# Patient Record
Sex: Female | Born: 1975
Health system: Southern US, Community
[De-identification: ages and names within clinical notes are randomized; demographics above are authoritative.]

## PROBLEM LIST (undated history)

## (undated) DIAGNOSIS — E669 Obesity, unspecified: Secondary | ICD-10-CM

## (undated) DIAGNOSIS — R51 Headache: Secondary | ICD-10-CM

## (undated) DIAGNOSIS — H548 Legal blindness, as defined in USA: Secondary | ICD-10-CM

## (undated) HISTORY — PX: ABDOMINAL HYSTERECTOMY: SHX81

## (undated) HISTORY — PX: TUBAL LIGATION: SHX77

## (undated) HISTORY — PX: LEG SURGERY: SHX1003

## (undated) HISTORY — PX: EYE SURGERY: SHX253

---

## 2007-06-08 ENCOUNTER — Emergency Department (HOSPITAL_COMMUNITY): Admission: EM | Admit: 2007-06-08 | Discharge: 2007-06-08 | Payer: Self-pay | Admitting: Emergency Medicine

## 2010-06-23 ENCOUNTER — Emergency Department (HOSPITAL_COMMUNITY): Admission: EM | Admit: 2010-06-23 | Discharge: 2010-06-23 | Payer: Self-pay | Admitting: Emergency Medicine

## 2011-01-01 LAB — POCT I-STAT, CHEM 8
BUN: 10 mg/dL (ref 6–23)
Calcium, Ion: 0.96 mmol/L — ABNORMAL LOW (ref 1.12–1.32)
Chloride: 107 mEq/L (ref 96–112)
Creatinine, Ser: 0.7 mg/dL (ref 0.4–1.2)
Glucose, Bld: 82 mg/dL (ref 70–99)

## 2011-01-01 LAB — GLUCOSE, CAPILLARY: Glucose-Capillary: 113 mg/dL — ABNORMAL HIGH (ref 70–99)

## 2011-01-08 ENCOUNTER — Inpatient Hospital Stay (INDEPENDENT_AMBULATORY_CARE_PROVIDER_SITE_OTHER)
Admission: RE | Admit: 2011-01-08 | Discharge: 2011-01-08 | Disposition: A | Discharge status: Home / Self Care | Payer: Self-pay | Source: Ambulatory Visit | Attending: Family Medicine | Admitting: Family Medicine

## 2011-01-08 DIAGNOSIS — K649 Unspecified hemorrhoids: Secondary | ICD-10-CM

## 2011-03-25 ENCOUNTER — Emergency Department (HOSPITAL_COMMUNITY)
Admission: EM | Admit: 2011-03-25 | Discharge: 2011-03-25 | Disposition: A | Discharge status: Home / Self Care | Payer: Self-pay | Attending: Emergency Medicine | Admitting: Emergency Medicine

## 2011-03-25 DIAGNOSIS — K089 Disorder of teeth and supporting structures, unspecified: Secondary | ICD-10-CM | POA: Insufficient documentation

## 2011-03-25 DIAGNOSIS — K047 Periapical abscess without sinus: Secondary | ICD-10-CM | POA: Insufficient documentation

## 2011-03-25 DIAGNOSIS — K029 Dental caries, unspecified: Secondary | ICD-10-CM | POA: Insufficient documentation

## 2011-11-08 ENCOUNTER — Encounter (HOSPITAL_COMMUNITY): Payer: Self-pay | Admitting: Emergency Medicine

## 2011-11-08 ENCOUNTER — Emergency Department (HOSPITAL_COMMUNITY)
Admission: EM | Admit: 2011-11-08 | Discharge: 2011-11-09 | Disposition: A | Discharge status: Home / Self Care | Payer: Self-pay | Attending: Emergency Medicine | Admitting: Emergency Medicine

## 2011-11-08 ENCOUNTER — Other Ambulatory Visit: Payer: Self-pay

## 2011-11-08 DIAGNOSIS — N926 Irregular menstruation, unspecified: Secondary | ICD-10-CM | POA: Insufficient documentation

## 2011-11-08 DIAGNOSIS — J45909 Unspecified asthma, uncomplicated: Secondary | ICD-10-CM | POA: Insufficient documentation

## 2011-11-08 DIAGNOSIS — R55 Syncope and collapse: Secondary | ICD-10-CM | POA: Insufficient documentation

## 2011-11-08 DIAGNOSIS — R42 Dizziness and giddiness: Secondary | ICD-10-CM | POA: Insufficient documentation

## 2011-11-08 DIAGNOSIS — H548 Legal blindness, as defined in USA: Secondary | ICD-10-CM | POA: Insufficient documentation

## 2011-11-08 DIAGNOSIS — G43829 Menstrual migraine, not intractable, without status migrainosus: Secondary | ICD-10-CM | POA: Insufficient documentation

## 2011-11-08 DIAGNOSIS — R109 Unspecified abdominal pain: Secondary | ICD-10-CM | POA: Insufficient documentation

## 2011-11-08 DIAGNOSIS — N939 Abnormal uterine and vaginal bleeding, unspecified: Secondary | ICD-10-CM | POA: Insufficient documentation

## 2011-11-08 HISTORY — DX: Legal blindness, as defined in USA: H54.8

## 2011-11-08 LAB — POCT I-STAT, CHEM 8
BUN: 9 mg/dL (ref 6–23)
Calcium, Ion: 1.11 mmol/L — ABNORMAL LOW (ref 1.12–1.32)
Creatinine, Ser: 0.7 mg/dL (ref 0.50–1.10)
Hemoglobin: 12.6 g/dL (ref 12.0–15.0)
TCO2: 23 mmol/L (ref 0–100)

## 2011-11-08 LAB — URINALYSIS, ROUTINE W REFLEX MICROSCOPIC
Glucose, UA: NEGATIVE mg/dL
Hgb urine dipstick: NEGATIVE
Ketones, ur: NEGATIVE mg/dL
Leukocytes, UA: NEGATIVE
pH: 5.5 (ref 5.0–8.0)

## 2011-11-08 LAB — WET PREP, GENITAL: Trich, Wet Prep: NONE SEEN

## 2011-11-08 LAB — GLUCOSE, CAPILLARY: Glucose-Capillary: 102 mg/dL — ABNORMAL HIGH (ref 70–99)

## 2011-11-08 MED ORDER — MORPHINE SULFATE 4 MG/ML IJ SOLN
4.0000 mg | Freq: Once | INTRAMUSCULAR | Status: AC
  Administered 2011-11-08: 4 mg via INTRAVENOUS
  Filled 2011-11-08: qty 1

## 2011-11-08 MED ORDER — SODIUM CHLORIDE 0.9 % IV BOLUS (SEPSIS)
1000.0000 mL | Freq: Once | INTRAVENOUS | Status: AC
  Administered 2011-11-08: 1000 mL via INTRAVENOUS

## 2011-11-08 NOTE — ED Notes (Signed)
C/o headache and feeling lightheaded since yesterday.  Pt states she is having heavy vaginal bleeding.

## 2011-11-08 NOTE — ED Notes (Signed)
CBG was 102. Notified Nurse Lanora Manis.

## 2011-11-08 NOTE — ED Notes (Signed)
Pelvic cart is set up in the patients room.

## 2011-11-08 NOTE — ED Notes (Signed)
Pt reports she has soaked 6 pads today with vaginal bleeding; has been getting "charley horses on legs" and feels like she cannot get warm. No reported hx or diagnosis to explain heavy menstrual cycle this month.

## 2011-11-08 NOTE — ED Provider Notes (Signed)
History     CSN: [id number]  Arrival date & time 11/08/11  2118   First MD Initiated Contact with Patient 11/08/11 2134      Chief Complaint  Patient presents with  . Dizziness    (Consider location/radiation/quality/duration/timing/severity/associated sxs/prior treatment) HPI  36 year old female presenting to the ED with chief complaints of lightheadedness and vaginal bleeding. Patient states she just recently started her menstruation since yesterday. She noticed increasing vaginal bleeding than usual. She quantified at 6-8 pads with evidence of clots. She is having headache, lightheadedness, and pressure in the lower abdomen. She denies fever, chest pain, increased shortness of breath, back pain, dysuria, vaginal discharge, or rash. She is in a monogamous relationship.  She has had tubal ligation. She has a history of asthma and uses the inhaler as needed. She denies any medication changes. She has been eating and drinking as normal.  Past Medical History  Diagnosis Date  . Asthma   . Legally blind     Past Surgical History  Procedure Date  . Tubal ligation   . Leg surgery   . Cesarean section     No family history on file.  History  Substance Use Topics  . Smoking status: Current Everyday Smoker  . Smokeless tobacco: Not on file  . Alcohol Use: Yes    OB History    Grav Para Term Preterm Abortions TAB SAB Ect Mult Living                  Review of Systems  All other systems reviewed and are negative.    Allergies  Review of patient's allergies indicates no known allergies.  Home Medications   Current Outpatient Rx  Name Route Sig Dispense Refill  . ALBUTEROL SULFATE HFA 108 (90 BASE) MCG/ACT IN AERS Inhalation Inhale 2 puffs into the lungs every 6 (six) hours as needed. For shortness of breath    . ASPIRIN-ACETAMINOPHEN-CAFFEINE 520-260-32.5 MG PO PACK Oral Take 1 packet by mouth daily as needed. For headache pain      BP 123/80  Pulse 83  Temp(Src)  98.1 F (36.7 C) (Oral)  Resp 18  SpO2 100%  LMP 11/07/2011  Physical Exam  Nursing note and vitals reviewed. Constitutional: She appears well-developed and well-nourished. No distress.  HENT:  Head: Normocephalic and atraumatic.  Eyes: Conjunctivae are normal.  Neck: Normal range of motion. Neck supple.  Cardiovascular: Normal rate and regular rhythm.   Pulmonary/Chest: Effort normal and breath sounds normal. She exhibits no tenderness.  Abdominal: Soft. There is no tenderness.  Genitourinary: Uterus normal. There is no rash or lesion on the right labia. There is no rash or lesion on the left labia. Cervix exhibits no motion tenderness and no discharge. Right adnexum displays tenderness. Right adnexum displays no mass. Left adnexum displays no mass and no tenderness. There is bleeding around the vagina. No erythema or tenderness around the vagina. No vaginal discharge found.  Lymphadenopathy:       Right: No inguinal adenopathy present.       Left: No inguinal adenopathy present.    ED Course  Procedures (including critical care time)  Labs Reviewed - No data to display No results found.   No diagnosis found.  Results for orders placed during the hospital encounter of 11/08/11  URINALYSIS, ROUTINE W REFLEX MICROSCOPIC      Component Value Range   Color, Urine AMBER (*) YELLOW    APPearance CLOUDY (*) CLEAR    Specific Gravity, Urine  1.045 (*) 1.005 - 1.030    pH 5.5  5.0 - 8.0    Glucose, UA NEGATIVE  NEGATIVE (mg/dL)   Hgb urine dipstick NEGATIVE  NEGATIVE    Bilirubin Urine SMALL (*) NEGATIVE    Ketones, ur NEGATIVE  NEGATIVE (mg/dL)   Protein, ur NEGATIVE  NEGATIVE (mg/dL)   Urobilinogen, UA 1.0  0.0 - 1.0 (mg/dL)   Nitrite NEGATIVE  NEGATIVE    Leukocytes, UA NEGATIVE  NEGATIVE   PREGNANCY, URINE      Component Value Range   Preg Test, Ur NEGATIVE    WET PREP, GENITAL      Component Value Range   Yeast, Wet Prep NONE SEEN  NONE SEEN    Trich, Wet Prep NONE  SEEN  NONE SEEN    Clue Cells, Wet Prep FEW (*) NONE SEEN    WBC, Wet Prep HPF POC FEW (*) NONE SEEN   POCT I-STAT, CHEM 8      Component Value Range   Sodium 143  135 - 145 (mEq/L)   Potassium 3.7  3.5 - 5.1 (mEq/L)   Chloride 108  96 - 112 (mEq/L)   BUN 9  6 - 23 (mg/dL)   Creatinine, Ser 4.54  0.50 - 1.10 (mg/dL)   Glucose, Bld 96  70 - 99 (mg/dL)   Calcium, Ion 0.98 (*) 1.12 - 1.32 (mmol/L)   TCO2 23  0 - 100 (mmol/L)   Hemoglobin 12.6  12.0 - 15.0 (g/dL)   HCT 11.9  14.7 - 82.9 (%)  GLUCOSE, CAPILLARY      Component Value Range   Glucose-Capillary 102 (*) 70 - 99 (mg/dL)   Comment 1 Documented in Chart     Comment 2 Notify RN     No results found.    MDM  Pelvic examination is remarkable fall pain to the right adnexa. Moderate urine bleeding noted without obvious clots or products of conception. No obvious discharge noted. Wet prep is unremarkable, urinalysis doesn't shows any signs of infection. Her electrolytes are within normal limits. Her pregnancy test is negative. She has normal orthostatic vital sign. She does admits to having a history of ovarian cyst, which I anticipate that this may be related to it.  Patient state her low abdominal pain is similar to her normal menstruation but a bit more intense.  Pt is amenable to taking Provera to help with her bleeding. She agrees to follow with Dr. for further evaluation.        Fayrene Helper, PA-C 11/09/11 0018

## 2011-11-09 MED ORDER — MEDROXYPROGESTERONE ACETATE 5 MG PO TABS: 5.0000 mg | ORAL_TABLET | Freq: Every day | ORAL | Status: DC

## 2011-11-09 MED ORDER — HYDROCODONE-ACETAMINOPHEN 5-500 MG PO TABS: 2.0000 | ORAL_TABLET | Freq: Four times a day (QID) | ORAL | Status: AC | PRN

## 2011-11-09 NOTE — ED Provider Notes (Signed)
Medical screening examination/treatment/procedure(s) were performed by non-physician practitioner and as supervising physician I was immediately available for consultation/collaboration.   Dione Booze, MD 11/09/11 2251

## 2011-11-09 NOTE — ED Notes (Signed)
Discharge inst given  Voiced understanding.  Prescriptions given

## 2011-11-10 LAB — GC/CHLAMYDIA PROBE AMP, GENITAL: Chlamydia, DNA Probe: NEGATIVE

## 2012-02-10 ENCOUNTER — Encounter (HOSPITAL_COMMUNITY): Payer: Self-pay | Admitting: *Deleted

## 2012-02-10 ENCOUNTER — Emergency Department (HOSPITAL_COMMUNITY)
Admission: EM | Admit: 2012-02-10 | Discharge: 2012-02-10 | Disposition: A | Discharge status: Home / Self Care | Payer: Self-pay | Source: Home / Self Care | Attending: Family Medicine | Admitting: Family Medicine

## 2012-02-10 DIAGNOSIS — J45909 Unspecified asthma, uncomplicated: Secondary | ICD-10-CM

## 2012-02-10 DIAGNOSIS — Z720 Tobacco use: Secondary | ICD-10-CM

## 2012-02-10 MED ORDER — CEFUROXIME AXETIL 250 MG PO TABS: 250.0000 mg | ORAL_TABLET | Freq: Two times a day (BID) | ORAL | Status: AC

## 2012-02-10 MED ORDER — FLUTICASONE PROPIONATE 50 MCG/ACT NA SUSP: 2.0000 | Freq: Every day | NASAL | Status: DC

## 2012-02-10 MED ORDER — METHYLPREDNISOLONE ACETATE 40 MG/ML IJ SUSP
80.0000 mg | Freq: Once | INTRAMUSCULAR | Status: AC
  Administered 2012-02-10: 80 mg via INTRAMUSCULAR

## 2012-02-10 MED ORDER — IPRATROPIUM BROMIDE 0.02 % IN SOLN
0.5000 mg | Freq: Once | RESPIRATORY_TRACT | Status: AC
  Administered 2012-02-10: 0.5 mg via RESPIRATORY_TRACT

## 2012-02-10 MED ORDER — ALBUTEROL SULFATE (5 MG/ML) 0.5% IN NEBU
INHALATION_SOLUTION | RESPIRATORY_TRACT | Status: AC
  Filled 2012-02-10: qty 1

## 2012-02-10 MED ORDER — ALBUTEROL SULFATE HFA 108 (90 BASE) MCG/ACT IN AERS: 1.0000 | INHALATION_SPRAY | Freq: Four times a day (QID) | RESPIRATORY_TRACT | Status: DC | PRN

## 2012-02-10 MED ORDER — METHYLPREDNISOLONE ACETATE 80 MG/ML IJ SUSP
INTRAMUSCULAR | Status: AC
  Filled 2012-02-10: qty 1

## 2012-02-10 MED ORDER — ALBUTEROL SULFATE (5 MG/ML) 0.5% IN NEBU
5.0000 mg | INHALATION_SOLUTION | Freq: Once | RESPIRATORY_TRACT | Status: AC
  Administered 2012-02-10: 5 mg via RESPIRATORY_TRACT

## 2012-02-10 MED ORDER — FLUCONAZOLE 150 MG PO TABS: 150.0000 mg | ORAL_TABLET | Freq: Once | ORAL | Status: AC

## 2012-02-10 NOTE — ED Notes (Signed)
Breathing treatment completed - breathing easier - feels better

## 2012-02-10 NOTE — Discharge Instructions (Signed)
Take all of medicine, drink lots of fluids, no more smoking, see your doctor if further problems  °

## 2012-02-10 NOTE — ED Provider Notes (Addendum)
History     CSN: [id number]  Arrival date & time 02/10/12  1619   First MD Initiated Contact with Patient 02/10/12 1657      No chief complaint on file.   (Consider location/radiation/quality/duration/timing/severity/associated sxs/prior treatment) Patient is a 36 y.o. female presenting with cough. The history is provided by the patient.  Cough This is a new problem. The current episode started more than 1 week ago. The problem has not changed since onset.The cough is productive of sputum. There has been no fever. Associated symptoms include rhinorrhea and wheezing. She is a smoker. Her past medical history is significant for bronchitis and asthma.    Past Medical History  Diagnosis Date  . Asthma   . Legally blind     Past Surgical History  Procedure Date  . Tubal ligation   . Leg surgery   . Cesarean section     No family history on file.  History  Substance Use Topics  . Smoking status: Current Everyday Smoker  . Smokeless tobacco: Not on file  . Alcohol Use: Yes    OB History    Grav Para Term Preterm Abortions TAB SAB Ect Mult Living                  Review of Systems  Constitutional: Negative.   HENT: Positive for congestion, rhinorrhea, sneezing and postnasal drip.   Respiratory: Positive for cough and wheezing.     Allergies  Review of patient's allergies indicates no known allergies.  Home Medications   Current Outpatient Rx  Name Route Sig Dispense Refill  . ALBUTEROL SULFATE HFA 108 (90 BASE) MCG/ACT IN AERS Inhalation Inhale 2 puffs into the lungs every 6 (six) hours as needed. For shortness of breath    . ALBUTEROL SULFATE HFA 108 (90 BASE) MCG/ACT IN AERS Inhalation Inhale 1-2 puffs into the lungs every 6 (six) hours as needed for wheezing. 1 Inhaler 0  . ASPIRIN-ACETAMINOPHEN-CAFFEINE 520-260-32.5 MG PO PACK Oral Take 1 packet by mouth daily as needed. For headache pain    . CEFUROXIME AXETIL 250 MG PO TABS Oral Take 1 tablet (250 mg total)  by mouth 2 (two) times daily. 20 tablet 0  . FLUTICASONE PROPIONATE 50 MCG/ACT NA SUSP Nasal Place 2 sprays into the nose daily. 1 g 2  . MEDROXYPROGESTERONE ACETATE 5 MG PO TABS Oral Take 1 tablet (5 mg total) by mouth daily. 5 tablet 0    BP 129/81  Pulse 81  Temp(Src) 98.9 F (37.2 C) (Oral)  Resp 18  SpO2 100%  Physical Exam  Nursing note and vitals reviewed. Constitutional: She is oriented to person, place, and time. She appears well-developed and well-nourished.  HENT:  Head: Normocephalic.  Right Ear: External ear normal.  Left Ear: External ear normal.  Nose: Mucosal edema and rhinorrhea present.  Mouth/Throat: Oropharynx is clear and moist.  Eyes: Conjunctivae are normal. Pupils are equal, round, and reactive to light.  Neck: Normal range of motion. Neck supple.  Pulmonary/Chest: She has wheezes.  Neurological: She is alert and oriented to person, place, and time.  Skin: Skin is warm and dry.    ED Course  Procedures (including critical care time)  Labs Reviewed - No data to display No results found.   1. Asthma with allergic rhinitis   2. Bronchitis due to tobacco use       MDM  Sx improved after neb        Linna Hoff, MD 02/10/12 1730  Linna Hoff, MD 02/10/12 [phone]

## 2012-02-10 NOTE — ED Notes (Signed)
Pt with c/o congestion /cough worse x one week - c/o headache today

## 2012-04-03 ENCOUNTER — Emergency Department (INDEPENDENT_AMBULATORY_CARE_PROVIDER_SITE_OTHER): Payer: BC Managed Care – PPO

## 2012-04-03 ENCOUNTER — Encounter (HOSPITAL_COMMUNITY): Payer: Self-pay | Admitting: *Deleted

## 2012-04-03 ENCOUNTER — Emergency Department (INDEPENDENT_AMBULATORY_CARE_PROVIDER_SITE_OTHER)
Admission: EM | Admit: 2012-04-03 | Discharge: 2012-04-03 | Disposition: A | Discharge status: Home / Self Care | Payer: BC Managed Care – PPO | Source: Home / Self Care | Attending: Emergency Medicine | Admitting: Emergency Medicine

## 2012-04-03 DIAGNOSIS — S83419A Sprain of medial collateral ligament of unspecified knee, initial encounter: Secondary | ICD-10-CM

## 2012-04-03 DIAGNOSIS — M1710 Unilateral primary osteoarthritis, unspecified knee: Secondary | ICD-10-CM

## 2012-04-03 DIAGNOSIS — M1711 Unilateral primary osteoarthritis, right knee: Secondary | ICD-10-CM

## 2012-04-03 MED ORDER — MELOXICAM 7.5 MG PO TABS: 7.5000 mg | ORAL_TABLET | Freq: Every day | ORAL | Status: AC

## 2012-04-03 NOTE — ED Provider Notes (Addendum)
History     CSN: [id number]  Arrival date & time 04/03/12  1308   First MD Initiated Contact with Patient 04/03/12 1335      Chief Complaint  Patient presents with  . Knee Pain    (Consider location/radiation/quality/duration/timing/severity/associated sxs/prior treatment) HPI Comments: Patient describes that Friday night she was sleeping when she had a severe cramp of her right calf and behind her knee, she stood up rapidly from her bed, trying to "shake it off", she believes she twisted her knee in a "odd painful way" and has been hurting since then. (Patient points towards the posterior aspect of her right knee and medial aspect of). Patient denies any lower extremity weakness, swelling or numbness or tingling sensations.  Patient also denies any constitutional symptoms such as fevers, malaise, body aches or other joint pains.  Patient is a 36 y.o. female presenting with knee pain. The history is provided by the patient.  Knee Pain This is a new problem. The current episode started more than 2 days ago. The problem occurs constantly. The problem has been gradually worsening. The symptoms are aggravated by walking and bending. The symptoms are relieved by rest. She has tried nothing for the symptoms.    Past Medical History  Diagnosis Date  . Asthma   . Legally blind     Past Surgical History  Procedure Date  . Tubal ligation   . Leg surgery   . Cesarean section     History reviewed. No pertinent family history.  History  Substance Use Topics  . Smoking status: Current Everyday Smoker  . Smokeless tobacco: Not on file  . Alcohol Use: Yes    OB History    Grav Para Term Preterm Abortions TAB SAB Ect Mult Living                  Review of Systems  Constitutional: Negative for fever, activity change and appetite change.  HENT: Negative for facial swelling.   Musculoskeletal: Positive for joint swelling. Negative for myalgias and back pain.  Skin: Negative for  color change, pallor, rash and wound.  Neurological: Negative for dizziness, weakness and numbness.    Allergies  Review of patient's allergies indicates no known allergies.  Home Medications   Current Outpatient Rx  Name Route Sig Dispense Refill  . ALBUTEROL SULFATE 1.25 MG/3ML IN NEBU Nebulization Take 1 ampule by nebulization every 6 (six) hours as needed.    . ALBUTEROL SULFATE HFA 108 (90 BASE) MCG/ACT IN AERS Inhalation Inhale 2 puffs into the lungs every 6 (six) hours as needed. For shortness of breath    . ALBUTEROL SULFATE HFA 108 (90 BASE) MCG/ACT IN AERS Inhalation Inhale 1-2 puffs into the lungs every 6 (six) hours as needed for wheezing. 1 Inhaler 0  . ASPIRIN-ACETAMINOPHEN-CAFFEINE 520-260-32.5 MG PO PACK Oral Take 1 packet by mouth daily as needed. For headache pain    . DIPHENHYDRAMINE HCL 25 MG PO CAPS Oral Take 25 mg by mouth every 6 (six) hours as needed.    Marland Kitchen FLUTICASONE PROPIONATE 50 MCG/ACT NA SUSP Nasal Place 2 sprays into the nose daily. 1 g 2  . IBUPROFEN 800 MG PO TABS Oral Take 800 mg by mouth every 8 (eight) hours as needed.    Marland Kitchen LORATADINE 10 MG PO TABS Oral Take 10 mg by mouth daily.    Marland Kitchen MEDROXYPROGESTERONE ACETATE 5 MG PO TABS Oral Take 1 tablet (5 mg total) by mouth daily. 5 tablet 0  BP 110/75  Pulse 75  Temp 98.8 F (37.1 C) (Oral)  Resp 18  SpO2 99%  Physical Exam  Nursing note and vitals reviewed. Constitutional: No distress.  Musculoskeletal: She exhibits tenderness.       Right knee: She exhibits decreased range of motion and swelling. She exhibits no ecchymosis, no deformity, no erythema, normal alignment, normal patellar mobility, no bony tenderness, normal meniscus and no MCL laxity. tenderness found. Medial joint line and MCL tenderness noted. No lateral joint line and no patellar tendon tenderness noted.       Legs: Neurological: She is alert.  Skin: Skin is warm. No rash noted. No erythema.    ED Course  Procedures (including  critical care time)  Labs Reviewed - No data to display No results found.   No diagnosis found.      Knee sprain strain with possibly medial collateral ligament sprain versus medial meniscus. 2 stable knee with a mild-to-moderate knee effusion. Patient was recommended to followup with the orthopedic Dr. for further evaluation and treatment modalities. Occurs to avoid weightbearing with the use of crutches and a knee immobilizer for the next 3-5 days       Jimmie Molly, MD 04/03/12 1417  Jimmie Molly, MD 04/03/12 1419

## 2012-04-03 NOTE — Discharge Instructions (Signed)
   As discussed followup with the orthopedic Dr. if you have problems walking over the pain persists beyond 5-7 days.   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 This is the wear and tear arthritis that comes with aging. It is also called degenerative arthritis. This is common in people past middle age. It is caused by stress on the joints. The large weight bearing joints of the lower extremities are most often affected. The knees, hips, back, neck, and hands can become painful, swollen, and stiff. This is the most common type of arthritis. It comes on with age, carrying too much weight, or from an injury. Treatment includes resting the sore joint until the pain and swelling improve. Crutches or a walker may be needed for severe flares. Only take over-the-counter or prescription medicines for pain, discomfort, or fever as directed by your caregiver. Local heat therapy may improve motion. Cortisone shots into the joint are sometimes used to reduce pain and swelling during flares. Osteoarthritis is usually not crippling and progresses slowly. There are things you can do to decrease pain:  Avoid high impact activities.   Exercise regularly.   Low impact exercises such as walking, biking and swimming help to keep the muscles strong and keep normal joint function.   Stretching helps to keep your range of motion.   Lose weight if you are overweight. This reduces joint stress.  In severe cases when you have pain at rest or increasing disability, joint surgery may be helpful. See your caregiver for follow-up treatment as recommended.  SEEK IMMEDIATE MEDICAL CARE IF:   You have severe joint pain.   Marked swelling and redness in your joint develops.   You develop a high fever.  Document Released: 10/05/2005 Document Revised: 09/24/2011 Document Reviewed: 03/07/2007 [facility] Patient Information 2012 [place], [place].

## 2012-04-03 NOTE — ED Notes (Signed)
States she had cramp in leg while sleeping Friday night, jumped up and felt knee shift sideways, co pain, worse with ambulation, has tried ibuprofen and aleve.

## 2012-06-08 ENCOUNTER — Other Ambulatory Visit: Payer: Self-pay | Admitting: Family Medicine

## 2012-06-08 DIAGNOSIS — R102 Pelvic and perineal pain: Secondary | ICD-10-CM

## 2012-06-10 ENCOUNTER — Ambulatory Visit
Admission: RE | Admit: 2012-06-10 | Discharge: 2012-06-10 | Disposition: A | Discharge status: Home / Self Care | Payer: BC Managed Care – PPO | Source: Ambulatory Visit | Attending: Family Medicine | Admitting: Family Medicine

## 2012-06-10 ENCOUNTER — Ambulatory Visit
Admission: RE | Admit: 2012-06-10 | Discharge: 2012-06-10 | Disposition: A | Discharge status: Home / Self Care | Payer: Self-pay | Source: Ambulatory Visit | Attending: Family Medicine | Admitting: Family Medicine

## 2012-06-10 DIAGNOSIS — R102 Pelvic and perineal pain: Secondary | ICD-10-CM

## 2012-08-12 ENCOUNTER — Other Ambulatory Visit: Payer: Self-pay | Admitting: Obstetrics and Gynecology

## 2012-09-19 ENCOUNTER — Other Ambulatory Visit (HOSPITAL_COMMUNITY): Payer: BC Managed Care – PPO

## 2012-09-20 ENCOUNTER — Encounter (HOSPITAL_COMMUNITY)
Admission: RE | Admit: 2012-09-20 | Discharge: 2012-09-20 | Disposition: A | Discharge status: Home / Self Care | Payer: BC Managed Care – PPO | Source: Ambulatory Visit | Attending: Obstetrics and Gynecology | Admitting: Obstetrics and Gynecology

## 2012-09-20 ENCOUNTER — Encounter (HOSPITAL_COMMUNITY): Payer: Self-pay

## 2012-09-20 ENCOUNTER — Other Ambulatory Visit: Payer: Self-pay | Admitting: Obstetrics and Gynecology

## 2012-09-20 HISTORY — DX: Headache: R51

## 2012-09-20 LAB — URINALYSIS, ROUTINE W REFLEX MICROSCOPIC
Bilirubin Urine: NEGATIVE
Glucose, UA: NEGATIVE mg/dL
Ketones, ur: NEGATIVE mg/dL
Protein, ur: NEGATIVE mg/dL
pH: 5.5 (ref 5.0–8.0)

## 2012-09-20 LAB — URINE MICROSCOPIC-ADD ON

## 2012-09-20 LAB — CBC
Hemoglobin: 10.6 g/dL — ABNORMAL LOW (ref 12.0–15.0)
MCH: 24.7 pg — ABNORMAL LOW (ref 26.0–34.0)
MCHC: 31.3 g/dL (ref 30.0–36.0)
MCV: 79 fL (ref 78.0–100.0)
RBC: 4.29 MIL/uL (ref 3.87–5.11)

## 2012-09-20 LAB — TYPE AND SCREEN
ABO/RH(D): O POS
Antibody Screen: NEGATIVE

## 2012-09-20 MED ORDER — DEXTROSE 5 % IV SOLN
3.0000 g | INTRAVENOUS | Status: AC
  Administered 2012-09-21: 3 g via INTRAVENOUS
  Filled 2012-09-20: qty 3000

## 2012-09-20 NOTE — Patient Instructions (Addendum)
20 Madison Welch  09/20/2012   Your procedure is scheduled on:  09/21/12  Enter through the Main Entrance of [facility] at 1100 AM.  Pick up the phone at the desk and dial [phone].   Call this number if you have problems the morning of surgery: [phone]   Remember:   Do not eat food:After Midnight.  Do not drink clear liquids: 4 Hours before arrival.  Take these medicines the morning of surgery with A SIP OF WATER: Bring inhaler   Do not wear jewelry, make-up or nail polish.  Do not wear lotions, powders, or perfumes. You may wear deodorant.  Do not shave 48 hours prior to surgery.  Do not bring valuables to the hospital.  Contacts, dentures or bridgework may not be worn into surgery.  Leave suitcase in the car. After surgery it may be brought to your room.  For patients admitted to the hospital, checkout time is 11:00 AM the day of discharge.   Patients discharged the day of surgery will not be allowed to drive home.  Name and phone number of your driver: NA  Special Instructions: Shower using CHG 2 nights before surgery and the night before surgery.  If you shower the day of surgery use CHG.  Use special wash - you have one bottle of CHG for all showers.  You should use approximately 1/3 of the bottle for each shower.   Please read over the following fact sheets that you were given: MRSA Information

## 2012-09-21 ENCOUNTER — Encounter (HOSPITAL_COMMUNITY)
Admission: RE | Disposition: A | Discharge status: Home / Self Care | Payer: Self-pay | Source: Ambulatory Visit | Attending: Obstetrics and Gynecology

## 2012-09-21 ENCOUNTER — Encounter (HOSPITAL_COMMUNITY): Payer: Self-pay

## 2012-09-21 ENCOUNTER — Encounter (HOSPITAL_COMMUNITY): Payer: Self-pay | Admitting: Anesthesiology

## 2012-09-21 ENCOUNTER — Ambulatory Visit (HOSPITAL_COMMUNITY): Payer: BC Managed Care – PPO | Admitting: Anesthesiology

## 2012-09-21 ENCOUNTER — Ambulatory Visit (HOSPITAL_COMMUNITY)
Admission: RE | Admit: 2012-09-21 | Discharge: 2012-09-22 | Disposition: A | Discharge status: Home / Self Care | Payer: BC Managed Care – PPO | Source: Ambulatory Visit | Attending: Obstetrics and Gynecology | Admitting: Obstetrics and Gynecology

## 2012-09-21 DIAGNOSIS — Z01818 Encounter for other preprocedural examination: Secondary | ICD-10-CM | POA: Insufficient documentation

## 2012-09-21 DIAGNOSIS — Y9210 Unspecified residential institution as the place of occurrence of the external cause: Secondary | ICD-10-CM | POA: Insufficient documentation

## 2012-09-21 DIAGNOSIS — N8000 Endometriosis of the uterus, unspecified: Secondary | ICD-10-CM | POA: Insufficient documentation

## 2012-09-21 DIAGNOSIS — N920 Excessive and frequent menstruation with regular cycle: Principal | ICD-10-CM | POA: Insufficient documentation

## 2012-09-21 DIAGNOSIS — N946 Dysmenorrhea, unspecified: Secondary | ICD-10-CM | POA: Insufficient documentation

## 2012-09-21 DIAGNOSIS — IMO0002 Reserved for concepts with insufficient information to code with codable children: Secondary | ICD-10-CM | POA: Insufficient documentation

## 2012-09-21 DIAGNOSIS — Z01812 Encounter for preprocedural laboratory examination: Secondary | ICD-10-CM | POA: Insufficient documentation

## 2012-09-21 DIAGNOSIS — N949 Unspecified condition associated with female genital organs and menstrual cycle: Secondary | ICD-10-CM | POA: Insufficient documentation

## 2012-09-21 DIAGNOSIS — N736 Female pelvic peritoneal adhesions (postinfective): Secondary | ICD-10-CM | POA: Insufficient documentation

## 2012-09-21 DIAGNOSIS — D251 Intramural leiomyoma of uterus: Secondary | ICD-10-CM | POA: Insufficient documentation

## 2012-09-21 DIAGNOSIS — D649 Anemia, unspecified: Secondary | ICD-10-CM | POA: Insufficient documentation

## 2012-09-21 HISTORY — PX: ROBOTIC ASSISTED TOTAL HYSTERECTOMY: SHX6085

## 2012-09-21 HISTORY — PX: ROBOTIC ASSISTED LAPAROSCOPIC LYSIS OF ADHESION: SHX6080

## 2012-09-21 HISTORY — PX: CYSTOSCOPY: SHX5120

## 2012-09-21 SURGERY — ROBOTIC ASSISTED TOTAL HYSTERECTOMY
Anesthesia: General | Site: Urethra | Wound class: Clean Contaminated

## 2012-09-21 MED ORDER — DIPHENHYDRAMINE HCL 12.5 MG/5ML PO ELIX: 12.5000 mg | ORAL_SOLUTION | Freq: Four times a day (QID) | ORAL | Status: DC | PRN

## 2012-09-21 MED ORDER — PROPOFOL 10 MG/ML IV EMUL
INTRAVENOUS | Status: AC
  Filled 2012-09-21: qty 20

## 2012-09-21 MED ORDER — DEXAMETHASONE SODIUM PHOSPHATE 10 MG/ML IJ SOLN
INTRAMUSCULAR | Status: AC
  Filled 2012-09-21: qty 1

## 2012-09-21 MED ORDER — ONDANSETRON HCL 4 MG/2ML IJ SOLN: 4.0000 mg | Freq: Four times a day (QID) | INTRAMUSCULAR | Status: DC | PRN

## 2012-09-21 MED ORDER — FENTANYL CITRATE 0.05 MG/ML IJ SOLN
INTRAMUSCULAR | Status: AC
  Administered 2012-09-21: 50 ug via INTRAVENOUS
  Filled 2012-09-21: qty 2

## 2012-09-21 MED ORDER — INFLUENZA VIRUS VACC SPLIT PF IM SUSP
0.5000 mL | INTRAMUSCULAR | Status: AC
  Administered 2012-09-22: 0.5 mL via INTRAMUSCULAR

## 2012-09-21 MED ORDER — HYDROMORPHONE HCL PF 1 MG/ML IJ SOLN
INTRAMUSCULAR | Status: AC
  Filled 2012-09-21: qty 1

## 2012-09-21 MED ORDER — PHENYLEPHRINE HCL 10 MG/ML IJ SOLN
INTRAMUSCULAR | Status: DC | PRN
  Administered 2012-09-21 (×3): 40 ug via INTRAVENOUS
  Administered 2012-09-21: 80 ug via INTRAVENOUS
  Administered 2012-09-21: 40 ug via INTRAVENOUS
  Administered 2012-09-21: 80 ug via INTRAVENOUS

## 2012-09-21 MED ORDER — ROCURONIUM BROMIDE 50 MG/5ML IV SOLN
INTRAVENOUS | Status: AC
  Filled 2012-09-21: qty 1

## 2012-09-21 MED ORDER — INDIGOTINDISULFONATE SODIUM 8 MG/ML IJ SOLN
INTRAMUSCULAR | Status: AC
  Filled 2012-09-21: qty 5

## 2012-09-21 MED ORDER — STERILE WATER FOR IRRIGATION IR SOLN
Status: DC | PRN
  Administered 2012-09-21 (×2): 1000 mL via INTRAVESICAL

## 2012-09-21 MED ORDER — INDIGOTINDISULFONATE SODIUM 8 MG/ML IJ SOLN
INTRAMUSCULAR | Status: DC | PRN
  Administered 2012-09-21: 5 mL via INTRAVENOUS

## 2012-09-21 MED ORDER — NEOSTIGMINE METHYLSULFATE 1 MG/ML IJ SOLN
INTRAMUSCULAR | Status: AC
  Filled 2012-09-21: qty 10

## 2012-09-21 MED ORDER — ARTIFICIAL TEARS OP OINT
TOPICAL_OINTMENT | OPHTHALMIC | Status: DC | PRN
  Administered 2012-09-21 (×2): 1 via OPHTHALMIC

## 2012-09-21 MED ORDER — METHYLENE BLUE 1 % INJ SOLN
INTRAMUSCULAR | Status: DC | PRN
  Administered 2012-09-21: 1 mL via SUBMUCOSAL

## 2012-09-21 MED ORDER — METHYLENE BLUE 1 % INJ SOLN
INTRAMUSCULAR | Status: AC
  Filled 2012-09-21: qty 1

## 2012-09-21 MED ORDER — MIDAZOLAM HCL 5 MG/5ML IJ SOLN
INTRAMUSCULAR | Status: DC | PRN
  Administered 2012-09-21: 2 mg via INTRAVENOUS

## 2012-09-21 MED ORDER — SODIUM CHLORIDE 0.9 % IJ SOLN: 9.0000 mL | INTRAMUSCULAR | Status: DC | PRN

## 2012-09-21 MED ORDER — FLUTICASONE PROPIONATE 50 MCG/ACT NA SUSP
2.0000 | Freq: Every day | NASAL | Status: DC
  Administered 2012-09-22: 2 via NASAL
  Filled 2012-09-21: qty 16

## 2012-09-21 MED ORDER — ONDANSETRON HCL 4 MG/2ML IJ SOLN
INTRAMUSCULAR | Status: DC | PRN
  Administered 2012-09-21: 4 mg via INTRAVENOUS

## 2012-09-21 MED ORDER — DIPHENHYDRAMINE HCL 50 MG/ML IJ SOLN: 12.5000 mg | Freq: Four times a day (QID) | INTRAMUSCULAR | Status: DC | PRN

## 2012-09-21 MED ORDER — ROCURONIUM BROMIDE 100 MG/10ML IV SOLN
INTRAVENOUS | Status: DC | PRN
  Administered 2012-09-21 (×2): 10 mg via INTRAVENOUS
  Administered 2012-09-21: 20 mg via INTRAVENOUS
  Administered 2012-09-21: 50 mg via INTRAVENOUS
  Administered 2012-09-21: 10 mg via INTRAVENOUS
  Administered 2012-09-21: 20 mg via INTRAVENOUS
  Administered 2012-09-21 (×2): 10 mg via INTRAVENOUS

## 2012-09-21 MED ORDER — GLYCOPYRROLATE 0.2 MG/ML IJ SOLN
INTRAMUSCULAR | Status: AC
  Filled 2012-09-21: qty 1

## 2012-09-21 MED ORDER — FENTANYL CITRATE 0.05 MG/ML IJ SOLN
INTRAMUSCULAR | Status: DC | PRN
  Administered 2012-09-21: 50 ug via INTRAVENOUS
  Administered 2012-09-21: 100 ug via INTRAVENOUS
  Administered 2012-09-21 (×3): 50 ug via INTRAVENOUS
  Administered 2012-09-21: 150 ug via INTRAVENOUS

## 2012-09-21 MED ORDER — PHENYLEPHRINE 40 MCG/ML (10ML) SYRINGE FOR IV PUSH (FOR BLOOD PRESSURE SUPPORT)
PREFILLED_SYRINGE | INTRAVENOUS | Status: AC
  Filled 2012-09-21: qty 5

## 2012-09-21 MED ORDER — FENTANYL CITRATE 0.05 MG/ML IJ SOLN
INTRAMUSCULAR | Status: AC
  Filled 2012-09-21: qty 5

## 2012-09-21 MED ORDER — ALBUTEROL SULFATE HFA 108 (90 BASE) MCG/ACT IN AERS
INHALATION_SPRAY | RESPIRATORY_TRACT | Status: DC | PRN
  Administered 2012-09-21: 2 via RESPIRATORY_TRACT

## 2012-09-21 MED ORDER — LACTATED RINGERS IV SOLN
INTRAVENOUS | Status: DC
  Administered 2012-09-22: 02:00:00 via INTRAVENOUS

## 2012-09-21 MED ORDER — KETOROLAC TROMETHAMINE 30 MG/ML IJ SOLN: 15.0000 mg | Freq: Once | INTRAMUSCULAR | Status: DC | PRN

## 2012-09-21 MED ORDER — DEXAMETHASONE SODIUM PHOSPHATE 10 MG/ML IJ SOLN
INTRAMUSCULAR | Status: DC | PRN
  Administered 2012-09-21: 10 mg via INTRAVENOUS

## 2012-09-21 MED ORDER — GLYCOPYRROLATE 0.2 MG/ML IJ SOLN
INTRAMUSCULAR | Status: AC
  Filled 2012-09-21: qty 2

## 2012-09-21 MED ORDER — KETOROLAC TROMETHAMINE 30 MG/ML IJ SOLN
INTRAMUSCULAR | Status: AC
  Filled 2012-09-21: qty 1

## 2012-09-21 MED ORDER — NEOSTIGMINE METHYLSULFATE 1 MG/ML IJ SOLN
INTRAMUSCULAR | Status: DC | PRN
  Administered 2012-09-21 (×2): 2 mg via INTRAVENOUS

## 2012-09-21 MED ORDER — ALBUTEROL SULFATE HFA 108 (90 BASE) MCG/ACT IN AERS
INHALATION_SPRAY | RESPIRATORY_TRACT | Status: AC
  Filled 2012-09-21: qty 6.7

## 2012-09-21 MED ORDER — GLYCOPYRROLATE 0.2 MG/ML IJ SOLN
INTRAMUSCULAR | Status: DC | PRN
  Administered 2012-09-21: 0.1 mg via INTRAVENOUS
  Administered 2012-09-21 (×2): 0.4 mg via INTRAVENOUS

## 2012-09-21 MED ORDER — ONDANSETRON HCL 4 MG/2ML IJ SOLN
INTRAMUSCULAR | Status: AC
  Filled 2012-09-21: qty 2

## 2012-09-21 MED ORDER — NALOXONE HCL 0.4 MG/ML IJ SOLN: 0.4000 mg | INTRAMUSCULAR | Status: DC | PRN

## 2012-09-21 MED ORDER — ALBUTEROL SULFATE HFA 108 (90 BASE) MCG/ACT IN AERS
2.0000 | INHALATION_SPRAY | Freq: Four times a day (QID) | RESPIRATORY_TRACT | Status: DC | PRN
  Administered 2012-09-22: 2 via RESPIRATORY_TRACT

## 2012-09-21 MED ORDER — KETOROLAC TROMETHAMINE 30 MG/ML IJ SOLN
INTRAMUSCULAR | Status: DC | PRN
  Administered 2012-09-21: 30 mg via INTRAVENOUS

## 2012-09-21 MED ORDER — PROPOFOL 10 MG/ML IV EMUL
INTRAVENOUS | Status: DC | PRN
  Administered 2012-09-21: 200 mg via INTRAVENOUS

## 2012-09-21 MED ORDER — ROCURONIUM BROMIDE 50 MG/5ML IV SOLN
INTRAVENOUS | Status: AC
  Filled 2012-09-21: qty 2

## 2012-09-21 MED ORDER — LIDOCAINE HCL (CARDIAC) 20 MG/ML IV SOLN
INTRAVENOUS | Status: AC
  Filled 2012-09-21: qty 5

## 2012-09-21 MED ORDER — LIDOCAINE HCL (CARDIAC) 20 MG/ML IV SOLN
INTRAVENOUS | Status: DC | PRN
  Administered 2012-09-21: 50 mg via INTRAVENOUS

## 2012-09-21 MED ORDER — FENTANYL CITRATE 0.05 MG/ML IJ SOLN
25.0000 ug | INTRAMUSCULAR | Status: DC | PRN
  Administered 2012-09-21 (×4): 50 ug via INTRAVENOUS

## 2012-09-21 MED ORDER — PNEUMOCOCCAL VAC POLYVALENT 25 MCG/0.5ML IJ INJ
0.5000 mL | INJECTION | INTRAMUSCULAR | Status: AC
  Administered 2012-09-22: 0.5 mL via INTRAMUSCULAR
  Filled 2012-09-21: qty 0.5

## 2012-09-21 MED ORDER — ROPIVACAINE HCL 5 MG/ML IJ SOLN
INTRAMUSCULAR | Status: DC | PRN
  Administered 2012-09-21: 115 mL

## 2012-09-21 MED ORDER — HYDROMORPHONE HCL PF 1 MG/ML IJ SOLN
1.0000 mg | Freq: Once | INTRAMUSCULAR | Status: AC
  Administered 2012-09-21: 1 mg via INTRAVENOUS

## 2012-09-21 MED ORDER — ACETAMINOPHEN 10 MG/ML IV SOLN
1000.0000 mg | Freq: Once | INTRAVENOUS | Status: AC
  Administered 2012-09-21: 1000 mg via INTRAVENOUS
  Filled 2012-09-21: qty 100

## 2012-09-21 MED ORDER — HYDROMORPHONE 0.3 MG/ML IV SOLN
INTRAVENOUS | Status: DC
  Administered 2012-09-21: 21:00:00 via INTRAVENOUS
  Administered 2012-09-21: 1.2 mg via INTRAVENOUS
  Administered 2012-09-22: 2.4 mg via INTRAVENOUS
  Administered 2012-09-22: 1.5 mg via INTRAVENOUS
  Filled 2012-09-21: qty 25

## 2012-09-21 MED ORDER — LACTATED RINGERS IR SOLN
Status: DC | PRN
  Administered 2012-09-21: 3000 mL

## 2012-09-21 MED ORDER — LACTATED RINGERS IV SOLN
INTRAVENOUS | Status: DC
  Administered 2012-09-21 (×2): 125 mL/h via INTRAVENOUS
  Administered 2012-09-21 (×2): via INTRAVENOUS

## 2012-09-21 MED ORDER — ROPIVACAINE HCL 5 MG/ML IJ SOLN
INTRAMUSCULAR | Status: AC
  Filled 2012-09-21: qty 60

## 2012-09-21 MED ORDER — MIDAZOLAM HCL 2 MG/2ML IJ SOLN
INTRAMUSCULAR | Status: AC
  Filled 2012-09-21: qty 2

## 2012-09-21 SURGICAL SUPPLY — 69 items
ADH SKN CLS APL DERMABOND .7 (GAUZE/BANDAGES/DRESSINGS) ×2
BAG URINE DRAINAGE (UROLOGICAL SUPPLIES) ×3 IMPLANT
BARRIER ADHS 3X4 INTERCEED (GAUZE/BANDAGES/DRESSINGS) ×4 IMPLANT
BRR ADH 4X3 ABS CNTRL BYND (GAUZE/BANDAGES/DRESSINGS) ×4
CABLE HIGH FREQUENCY MONO STRZ (ELECTRODE) ×3 IMPLANT
CATH FOLEY 3WAY  5CC 16FR (CATHETERS) ×1
CATH FOLEY 3WAY 5CC 16FR (CATHETERS) ×2 IMPLANT
CONT PATH 16OZ SNAP LID 3702 (MISCELLANEOUS) ×3 IMPLANT
COVER MAYO STAND STRL (DRAPES) ×3 IMPLANT
COVER TABLE BACK 60X90 (DRAPES) ×6 IMPLANT
COVER TIP SHEARS 8 DVNC (MISCELLANEOUS) ×2 IMPLANT
COVER TIP SHEARS 8MM DA VINCI (MISCELLANEOUS) ×1
DECANTER SPIKE VIAL GLASS SM (MISCELLANEOUS) ×3 IMPLANT
DERMABOND ADVANCED (GAUZE/BANDAGES/DRESSINGS) ×1
DERMABOND ADVANCED .7 DNX12 (GAUZE/BANDAGES/DRESSINGS) ×2 IMPLANT
DILATOR CANAL MILEX (MISCELLANEOUS) ×3 IMPLANT
DRAPE HUG U DISPOSABLE (DRAPE) ×3 IMPLANT
DRAPE LG THREE QUARTER DISP (DRAPES) ×6 IMPLANT
DRAPE WARM FLUID 44X44 (DRAPE) ×3 IMPLANT
DURAPREP 26ML APPLICATOR (WOUND CARE) ×2 IMPLANT
ELECT REM PT RETURN 9FT ADLT (ELECTROSURGICAL) ×3
ELECTRODE REM PT RTRN 9FT ADLT (ELECTROSURGICAL) ×2 IMPLANT
EVACUATOR SMOKE 8.L (FILTER) ×3 IMPLANT
GAUZE VASELINE 3X9 (GAUZE/BANDAGES/DRESSINGS) IMPLANT
GLOVE BIO SURGEON STRL SZ7 (GLOVE) IMPLANT
GLOVE BIOGEL M 6.5 STRL (GLOVE) ×9 IMPLANT
GLOVE BIOGEL PI IND STRL 6.5 (GLOVE) ×4 IMPLANT
GLOVE BIOGEL PI IND STRL 7.0 (GLOVE) ×10 IMPLANT
GLOVE BIOGEL PI INDICATOR 6.5 (GLOVE) ×2
GLOVE BIOGEL PI INDICATOR 7.0 (GLOVE) ×5
GLOVE ECLIPSE 6.5 STRL STRAW (GLOVE) ×12 IMPLANT
GOWN STRL REIN XL XLG (GOWN DISPOSABLE) ×18 IMPLANT
KIT ACCESSORY DA VINCI DISP (KITS) ×1
KIT ACCESSORY DVNC DISP (KITS) ×2 IMPLANT
LEGGING LITHOTOMY PAIR STRL (DRAPES) ×3 IMPLANT
NDL INSUFFLATION 14GA 120MM (NEEDLE) IMPLANT
NEEDLE INSUFFLATION 14GA 120MM (NEEDLE) IMPLANT
OCCLUDER COLPOPNEUMO (BALLOONS) ×1 IMPLANT
PACK LAVH (CUSTOM PROCEDURE TRAY) ×3 IMPLANT
PAD PREP 24X48 CUFFED NSTRL (MISCELLANEOUS) ×6 IMPLANT
PLUG CATH AND CAP STER (CATHETERS) ×3 IMPLANT
PROTECTOR NERVE ULNAR (MISCELLANEOUS) ×6 IMPLANT
SET CYSTO W/LG BORE CLAMP LF (SET/KITS/TRAYS/PACK) ×4 IMPLANT
SET IRRIG TUBING LAPAROSCOPIC (IRRIGATION / IRRIGATOR) ×3 IMPLANT
SOLUTION ELECTROLUBE (MISCELLANEOUS) ×3 IMPLANT
SUT VIC AB 0 CT1 27 (SUTURE) ×6
SUT VIC AB 0 CT1 27XBRD ANBCTR (SUTURE) ×4 IMPLANT
SUT VIC AB 2-0 SH 27 (SUTURE) ×3
SUT VIC AB 2-0 SH 27XBRD (SUTURE) IMPLANT
SUT VICRYL 0 27 CT2 27 ABS (SUTURE) ×13 IMPLANT
SUT VICRYL 0 UR6 27IN ABS (SUTURE) ×3 IMPLANT
SUT VICRYL RAPIDE 4/0 PS 2 (SUTURE) ×6 IMPLANT
SYR 30ML LL (SYRINGE) ×3 IMPLANT
SYR 50ML LL SCALE MARK (SYRINGE) ×3 IMPLANT
SYSTEM CONVERTIBLE TROCAR (TROCAR) IMPLANT
TIP RUMI ORANGE 6.7MMX12CM (TIP) IMPLANT
TIP UTERINE 5.1X6CM LAV DISP (MISCELLANEOUS) IMPLANT
TIP UTERINE 6.7X10CM GRN DISP (MISCELLANEOUS) IMPLANT
TIP UTERINE 6.7X6CM WHT DISP (MISCELLANEOUS) IMPLANT
TIP UTERINE 6.7X8CM BLUE DISP (MISCELLANEOUS) ×1 IMPLANT
TOWEL OR 17X24 6PK STRL BLUE (TOWEL DISPOSABLE) ×9 IMPLANT
TROCAR 12M 150ML BLUNT (TROCAR) ×3 IMPLANT
TROCAR DISP BLADELESS 8 DVNC (TROCAR) ×2 IMPLANT
TROCAR DISP BLADELESS 8MM (TROCAR) ×1
TROCAR XCEL NON-BLD 11X100MML (ENDOMECHANICALS) ×3 IMPLANT
TROCAR Z-THREAD 12X150 (TROCAR) IMPLANT
TUBING FILTER THERMOFLATOR (ELECTROSURGICAL) ×3 IMPLANT
WARMER LAPAROSCOPE (MISCELLANEOUS) ×3 IMPLANT
WATER STERILE IRR 1000ML POUR (IV SOLUTION) ×9 IMPLANT

## 2012-09-21 NOTE — Transfer of Care (Signed)
Immediate Anesthesia Transfer of Care Note  Patient: Madison Welch  Procedure(s) Performed: Procedure(s) (LRB) with comments: ROBOTIC ASSISTED TOTAL HYSTERECTOMY (N/A) CYSTOSCOPY (N/A) - with cystotomy repair ROBOTIC ASSISTED LAPAROSCOPIC LYSIS OF ADHESION (N/A)  Patient Location: PACU  Anesthesia Type:General  Level of Consciousness: sedated  Airway & Oxygen Therapy: Patient Spontanous Breathing  Post-op Assessment: Report given to PACU RN  Post vital signs: Reviewed and stable  Complications: No apparent anesthesia complications

## 2012-09-21 NOTE — Anesthesia Postprocedure Evaluation (Signed)
Anesthesia Post Note  Patient: Madison Welch  Procedure(s) Performed: Procedure(s) (LRB): ROBOTIC ASSISTED TOTAL HYSTERECTOMY (N/A) CYSTOSCOPY (N/A) ROBOTIC ASSISTED LAPAROSCOPIC LYSIS OF ADHESION (N/A)  Anesthesia type: General  Patient location: PACU  Post pain: Pain level controlled  Post assessment: Post-op Vital signs reviewed  Last Vitals:  Filed Vitals:   09/21/12 1900  BP: 121/54  Pulse: 65  Temp:   Resp: 16    Post vital signs: Reviewed  Level of consciousness: sedated  Complications: No apparent anesthesia complicationsfj

## 2012-09-21 NOTE — Anesthesia Preprocedure Evaluation (Signed)
Anesthesia Evaluation  Patient identified by MRN, date of birth, ID band Patient awake    Reviewed: Allergy & Precautions, H&P , NPO status , Patient's Chart, lab work & pertinent test results, reviewed documented beta blocker date and time   History of Anesthesia Complications Negative for: history of anesthetic complications  Airway Mallampati: III TM Distance: >3 FB Neck ROM: full    Dental  (+) Teeth Intact   Pulmonary asthma (seasonal use, last used 2 weeks ago) , Current Smoker (1 ppd),  breath sounds clear to auscultation  Pulmonary exam normal       Cardiovascular Exercise Tolerance: Good negative cardio ROS  Rhythm:regular Rate:Normal     Neuro/Psych  Headaches (migraines - monthly), Legally blind in right eye negative psych ROS   GI/Hepatic negative GI ROS, Neg liver ROS,   Endo/Other  Morbid obesity  Renal/GU negative Renal ROS  Female GU complaint     Musculoskeletal   Abdominal   Peds  Hematology negative hematology ROS (+)   Anesthesia Other Findings   Reproductive/Obstetrics negative OB ROS                           Anesthesia Physical Anesthesia Plan  ASA: III  Anesthesia Plan: General ETT   Post-op Pain Management:    Induction:   Airway Management Planned:   Additional Equipment:   Intra-op Plan:   Post-operative Plan:   Informed Consent: I have reviewed the patients History and Physical, chart, labs and discussed the procedure including the risks, benefits and alternatives for the proposed anesthesia with the patient or authorized representative who has indicated his/her understanding and acceptance.   Dental Advisory Given  Plan Discussed with: CRNA and Surgeon  Anesthesia Plan Comments:         Anesthesia Quick Evaluation

## 2012-09-21 NOTE — Op Note (Signed)
09/21/2012  6:40 PM  PATIENT:  Madison Welch  36 y.o. female  PRE-OPERATIVE DIAGNOSIS:  menorrhagia  POST-OPERATIVE DIAGNOSIS:  menorrhagia  PROCEDURE:  Procedure(s) (LRB) with comments: ROBOTIC ASSISTED TOTAL HYSTERECTOMY (N/A) CYSTOSCOPY (N/A) - with cystotomy repair ROBOTIC ASSISTED LAPAROSCOPIC LYSIS OF ADHESION (N/A)  SURGEON:  Surgeon(s) and Role:    * Damichael Hofman J. Richardson Dopp, MD - Primary    * Geryl Rankins, MD - Assisting  PHYSICIAN ASSISTANT: None  ASSISTANTS: Dr. Geryl Rankins    ANESTHESIA:   general  EBL:50 cc   Total I/O In: 2700 [I.V.:2700] Out: 350 [Urine:300; Blood:50]  BLOOD ADMINISTERED:none  DRAINS: Urinary Catheter (Foley)   LOCAL MEDICATIONS USED:  OTHER ropivicaine   SPECIMEN:  Source of Specimen:  Uterus and cervix   DISPOSITION OF SPECIMEN:  PATHOLOGY  COUNTS:  YES  TOURNIQUET:  * No tourniquets in log *  DICTATION: .Dragon Dictation  PLAN OF CARE: Admit for overnight observation  PATIENT DISPOSITION:  PACU - hemodynamically stable.   Delay start of Pharmacological VTE agent (>24hrs) due to surgical blood loss or risk of bleeding: not applicable  Findings: Adhesions of the omentum to the anterior abdominal wall/ and uterus... Adhesions of the uterus to the anterior abdominal wall. Adhesions of the bladder to the uterus. Normal ovaries bilaterally.   Indication: This is a 43 -year-old G2P2 with menorrhagia , anemia  And dysmenorrhea.  She has a h/o cesarean section x 2. She had a wound breakdown with her last cesarean section.    Procedure: The  patient was taken to the operating room where she was placed under general anesthesia. She was placed in dorsal lithotomy position and prepped and draped in the usual sterile fashion. A weighted speculum was placed into the vagina.  A Deaver was placed anteriorly for retraction. The anterior lip of the cervix was grasped with a single-tooth tenaculum. The vaginal mucosa was injected with 2.5 cc of  ropivacaine at the 2/4/ 8 and 10 oclock  positions. The uterus was sounded to 11 cm the cervix was dilated to 6 mm . 0 vicryl sutrure  placed at the 12 and 6:00 positions  Of the cervix to facilitate placement of a Rumi  uterine malignant manipulator. The manipulator was placed without difficulty. Weighted speculum and Deaver were removed .    Attention was turned to the patient's abdomen where a  12 mm skin incision was made 4 cm above the umbilicus..  A 12 mm trocar was placed under direct visualization . The pneumoperitoneum  was achieved with CO2 gas.  The laparoscope was removed. 60 cc of ropivacaine were injected into the abdominal cavity. The laparoscope  was reinserted. An  8mm incision was made in the right upper quadrant and an 8 mm   trocar was placed 12 centimeters from the umbilicus.later connected to robotic arm #1). An incision was made in there  left upper quadrant TROCAR WAS PLACED 16 cm from the umbilicus. Later connected to robotic arm #2.  Attention was turned to the right upper quadrant where a 11 mm midclavicular assistant  trocar was placed. ( All incision sites were injected with 10cc of ropivicaine prior to port placement. )  Once all ports had been placed under direct visualization.The laparoscope was removed and the da Vinci robotic system was thin right-sided docked.  The robotic  arms were connected to the corresponding trocars as listed above. The laparoscope  was then reinserted.  The PK bipolar cautery was placed into port #1. The  monopolar scissor placed in the port #2. All instruments were directed into the pelvis under direct visualization.  Attention  was turned to the surgeons console.. The adhesions of the omentum  To the anterior abdominal wall  And uterus were excised with monopolar scissors. The adhesions of the uterus to  Anterior abdominal wall was excised with monopolar scissors. The left utero-ovarian ligament was cauterized with PK and excised with scissors.  The broad ligament was cauterized with PK incised with scissors. The round ligament was cauterized with the PK incised with scissors.The right  utero-ovarian ligament was cauterized with PK and excised with scissors. The right broad ligament was cauterized with PK excised scissors. The right round ligament was cauterized with PK and excised with scissors.   The anterior leaf of broad ligament was difficult to dissect due to adhesions of the bladder to the uterus.   The  Bladder was filled in a retrograde fashion with saline and methylene blue. The saline was then removed.  What was thought to be the bladder reflection was incised to the midline. During  This it was noted that a cystotomy had occurred. The cystotomy was repaired with 2-0 vicryl in a running fashion. A second layer of 2-0 vicryl was used to imbricate the incision. The gladder was then filled again in a retrograde fashion with saline and methylene blue. The repair appeared tight.     The broad ligament was incised  to the midline. The bladder was dissected off the lower uterine segments of the cervix via sharp and blunt dissection.  The uterine arteries were skeletonized bilaterally. They were cauterized with PK and transected. The KOH ring  was identified.  The anterior colpotomy was performed followed by the posterior colpotomy. Once the uterus and cervix were completely excised They were removed through the vagina.    Attention was turned to there right fallopian tube and ovary . The right ureter was identified.  Attention was then turned to the left fallopian tube and ovary. There left ureter  Could not be identified.   The pk and scissors were removed and log tip forceps were   placed in the port #1 and the cutting needle driver was placed in to port #2. The vaginal cuff angles were closed with figure-of-eight stitches of 0 Vicryl. The remainder of the vaginal cuff was closed with interrupted 0 Vicryl figure-of-eight sutures. The  pelvis was irrigated.  Marland Kitchen Marland Kitchen.Excellent hemostasis was noted. All pelvic pedicles were examined and hemostasis was noted. Interseed was placed along the vaginal cuff. All instuments  removed from the ports. The  pneumoperitoneum was released.   Cystoscopy was performed. And both ureteral orifices were identified and noted to express methylene blue.    All ports were removed. The  pneumoperitoneum was released.  The fascia of the 12 mm umbilical port was closed with 0 Vicryl and  the fascia the 11 mm port was closed with 0 Vicryl. The skin incisions were closed with 4-0 Vicryl and then covered with Dermabond.      Sponge lap and needle counts were correct x2.  The patient was awakened from anesthesia and taken to the recovery room in stable condition.

## 2012-09-21 NOTE — H&P (Signed)
Date of Initial H&P: 09/09/2012  History reviewed, patient examined, no change in status, stable for surgery.

## 2012-09-22 ENCOUNTER — Encounter (HOSPITAL_COMMUNITY): Payer: Self-pay | Admitting: Obstetrics and Gynecology

## 2012-09-22 DIAGNOSIS — N920 Excessive and frequent menstruation with regular cycle: Secondary | ICD-10-CM

## 2012-09-22 DIAGNOSIS — N946 Dysmenorrhea, unspecified: Secondary | ICD-10-CM

## 2012-09-22 LAB — HEMOGLOBIN AND HEMATOCRIT, BLOOD
HCT: 30.1 % — ABNORMAL LOW (ref 36.0–46.0)
Hemoglobin: 9.4 g/dL — ABNORMAL LOW (ref 12.0–15.0)

## 2012-09-22 MED ORDER — CIPROFLOXACIN HCL 500 MG PO TABS: 500.0000 mg | ORAL_TABLET | Freq: Two times a day (BID) | ORAL | Status: DC

## 2012-09-22 MED ORDER — BISACODYL 10 MG RE SUPP
10.0000 mg | Freq: Once | RECTAL | Status: AC
  Administered 2012-09-22: 10 mg via RECTAL
  Filled 2012-09-22 (×2): qty 1

## 2012-09-22 MED ORDER — OXYCODONE-ACETAMINOPHEN 5-325 MG PO TABS
1.0000 | ORAL_TABLET | ORAL | Status: DC | PRN
Start: 2012-09-22 — End: 2012-09-22
  Administered 2012-09-22: 1 via ORAL
  Administered 2012-09-22: 2 via ORAL
  Filled 2012-09-22: qty 2
  Filled 2012-09-22: qty 1

## 2012-09-22 MED ORDER — OXYCODONE-ACETAMINOPHEN 5-325 MG PO TABS: 1.0000 | ORAL_TABLET | ORAL | Status: DC | PRN

## 2012-09-22 MED ORDER — IBUPROFEN 800 MG PO TABS
800.0000 mg | ORAL_TABLET | Freq: Three times a day (TID) | ORAL | Status: DC | PRN
  Administered 2012-09-22: 800 mg via ORAL
  Filled 2012-09-22: qty 1

## 2012-09-22 MED ORDER — IBUPROFEN 800 MG PO TABS: 800.0000 mg | ORAL_TABLET | Freq: Three times a day (TID) | ORAL | Status: DC | PRN

## 2012-09-22 NOTE — Progress Notes (Signed)
1 Day Post-Op Procedure(s) (LRB): ROBOTIC ASSISTED TOTAL HYSTERECTOMY (N/A) CYSTOSCOPY (N/A) ROBOTIC ASSISTED LAPAROSCOPIC LYSIS OF ADHESION (N/A)  Subjective: Patient reports tolerating PO.  She denies flatus. Pain controlled .Marland Kitchen   Objective: I have reviewed patient's vital signs, intake and output, medications and labs.  General: alert and cooperative GI: soft appropriately tender nondistended. hypoactive bowel sounds  ... incision is healing well  Extremities: extremities normal, atraumatic, no cyanosis or edema  Assessment: s/p Procedure(s) (LRB) with comments: ROBOTIC ASSISTED TOTAL HYSTERECTOMY (N/A) CYSTOSCOPY (N/A) - with cystotomy repair ROBOTIC ASSISTED LAPAROSCOPIC LYSIS OF ADHESION (N/A): stable and awaiting bowel function.   Plan: Discontinue IV fluids Continue foley due to cystotomy with repair. .. pt to go home with foley and leg bag.  Dulcolax suppository.. If pt reports flatus may d/c home.  Cipro daily due to foley catheter for 7 days     LOS: 1 day    Deauna Yaw J. 09/22/2012, 12:08 PM

## 2012-09-22 NOTE — Discharge Summary (Signed)
Physician Discharge Summary  Patient ID: Trenyce Loera MRN: [id number] DOB/AGE: [birth year] 36 y.o.  Admit date: 09/21/2012 Discharge date: 09/22/2012  Admission Diagnoses:Menorrhagia/ dysmenorrhea/ pelvic pain/ Anemia   Discharge Diagnoses:  Active Problems:  Menorrhagia  Dysmenorrhea   Discharged Condition: stable  Hospital Course: pt was admitted for observation after robotic assisted laparoscopic hysterectomy extensive lysis of adhesions , cystotomy with repair , and cystoscopy   Consults: None  Significant Diagnostic Studies: labs: 10.6  Treatments: surgery: robotic assisted laparoscopic hysterectomy extensive lysis of adhesions , cystotomy with repair , and cystoscopy      Discharge Exam: Blood pressure 112/60, pulse 61, temperature 98.3 F (36.8 C), temperature source Oral, resp. rate 20, height 5\' 3"  (1.6 m), weight 133.358 kg (294 lb), SpO2 100.00%. General appearance: alert, cooperative and appears stated age GI: soft appropriately tender nondistended hypoactive bowel sounds .. incisions are well approximated  Extremities: extremities normal, atraumatic, no cyanosis or edema  Disposition: 01-Home or Self Care  Discharge Orders    Future Orders Please Complete By Expires   Diet - low sodium heart healthy      Increase activity slowly      Driving Restrictions      Comments:   Avoid driving for 1 week   Lifting restrictions      Comments:   Do not lift over 10 lbs   Sexual Activity Restrictions      Comments:   Avoid sex for 6-8 wks until instructed to resume by your physician   Call MD for:  temperature >100.4      Call MD for:  persistant nausea and vomiting      Call MD for:  severe uncontrolled pain      Call MD for:  redness, tenderness, or signs of infection (pain, swelling, redness, odor or green/yellow discharge around incision site)          Medication List     As of 09/22/2012 12:18 PM    STOP taking these medications         GOODYS EXTRA  STRENGTH 520-260-32.5 MG Pack   Generic drug: Aspirin-Acetaminophen-Caffeine      TAKE these medications         albuterol 108 (90 BASE) MCG/ACT inhaler   Commonly known as: PROVENTIL HFA;VENTOLIN HFA   Inhale 2 puffs into the lungs every 6 (six) hours as needed. For shortness of breath      ciprofloxacin 500 MG tablet   Commonly known as: CIPRO   Take 1 tablet (500 mg total) by mouth 2 (two) times daily.      fluticasone 50 MCG/ACT nasal spray   Commonly known as: FLONASE   Place 2 sprays into the nose daily.      ibuprofen 800 MG tablet   Commonly known as: ADVIL,MOTRIN   Take 800 mg by mouth every 8 (eight) hours as needed.      ibuprofen 800 MG tablet   Commonly known as: ADVIL,MOTRIN   Take 1 tablet (800 mg total) by mouth every 8 (eight) hours as needed.      oxyCODONE-acetaminophen 5-325 MG per tablet   Commonly known as: PERCOCET/ROXICET   Take 1-2 tablets by mouth every 4 (four) hours as needed.           Follow-up Information    Follow up with Geryl Rankins, MD. In 1 week. (May see Dr. Dion Body for foley removal in 1 week  )    Contact information:   [address],  [address] [place] [address] [phone]          Signed: Jessee Avers. 09/22/2012, 12:18 PM

## 2012-09-22 NOTE — Addendum Note (Signed)
Addendum  created 09/22/12 0757 by Renford Dills, CRNA   Modules edited:Notes Section

## 2012-09-22 NOTE — Anesthesia Postprocedure Evaluation (Signed)
  Anesthesia Post-op Note  Patient: Madison Welch  Procedure(s) Performed: Procedure(s) (LRB) with comments: ROBOTIC ASSISTED TOTAL HYSTERECTOMY (N/A) CYSTOSCOPY (N/A) - with cystotomy repair ROBOTIC ASSISTED LAPAROSCOPIC LYSIS OF ADHESION (N/A)  Patient Location: [facility]  Anesthesia Type:General  Level of Consciousness: awake  Airway and Oxygen Therapy: Patient Spontanous Breathing  Post-op Pain: mild  Post-op Assessment: Patient's Cardiovascular Status Stable and Respiratory Function Stable  Post-op Vital Signs: stable  Complications: No apparent anesthesia complications

## 2012-09-22 NOTE — Progress Notes (Signed)
Pt. Is discharged  In the care of friend. Downstairs per ambulatory. States her pain has improved  And is passing flatus. Pt. Understands all discharged instruction well Questions were asked and answered.  Foley catheter is in place and draing clear amber urine. Pt was given instructions on leg bag and foley catheter care Comphrended all instructions well. Stable.

## 2013-07-01 ENCOUNTER — Emergency Department (HOSPITAL_COMMUNITY)
Admission: EM | Admit: 2013-07-01 | Discharge: 2013-07-01 | Disposition: A | Discharge status: Home / Self Care | Payer: 59 | Attending: Emergency Medicine | Admitting: Emergency Medicine

## 2013-07-01 ENCOUNTER — Encounter (HOSPITAL_COMMUNITY): Payer: Self-pay | Admitting: *Deleted

## 2013-07-01 DIAGNOSIS — G44209 Tension-type headache, unspecified, not intractable: Secondary | ICD-10-CM | POA: Insufficient documentation

## 2013-07-01 DIAGNOSIS — F17200 Nicotine dependence, unspecified, uncomplicated: Secondary | ICD-10-CM | POA: Insufficient documentation

## 2013-07-01 DIAGNOSIS — M542 Cervicalgia: Secondary | ICD-10-CM | POA: Insufficient documentation

## 2013-07-01 DIAGNOSIS — E669 Obesity, unspecified: Secondary | ICD-10-CM | POA: Insufficient documentation

## 2013-07-01 DIAGNOSIS — Z79899 Other long term (current) drug therapy: Secondary | ICD-10-CM | POA: Insufficient documentation

## 2013-07-01 DIAGNOSIS — H548 Legal blindness, as defined in USA: Secondary | ICD-10-CM | POA: Insufficient documentation

## 2013-07-01 DIAGNOSIS — J45909 Unspecified asthma, uncomplicated: Secondary | ICD-10-CM | POA: Insufficient documentation

## 2013-07-01 DIAGNOSIS — R252 Cramp and spasm: Secondary | ICD-10-CM | POA: Insufficient documentation

## 2013-07-01 DIAGNOSIS — Z792 Long term (current) use of antibiotics: Secondary | ICD-10-CM | POA: Insufficient documentation

## 2013-07-01 HISTORY — DX: Obesity, unspecified: E66.9

## 2013-07-01 MED ORDER — METHOCARBAMOL 500 MG PO TABS: 500.0000 mg | ORAL_TABLET | Freq: Two times a day (BID) | ORAL | Status: DC

## 2013-07-01 MED ORDER — IBUPROFEN 800 MG PO TABS: 800.0000 mg | ORAL_TABLET | Freq: Three times a day (TID) | ORAL | Status: DC

## 2013-07-01 MED ORDER — KETOROLAC TROMETHAMINE 60 MG/2ML IM SOLN
60.0000 mg | Freq: Once | INTRAMUSCULAR | Status: AC
  Administered 2013-07-01: 60 mg via INTRAMUSCULAR
  Filled 2013-07-01: qty 2

## 2013-07-01 NOTE — ED Notes (Signed)
Pt reports having neck pain since Monday, woke up with pain and stiffness, diff turning her head. No acute distress noted at triage.

## 2013-07-01 NOTE — ED Provider Notes (Signed)
CSN: [id number]     Arrival date & time 07/01/13  1400 History  This chart was scribed for non-physician practitioner, Roxy Horseman, PA-C working with Flint Melter, MD by Greggory Stallion, ED scribe. This patient was seen in room TR10C/TR10C and the patient's care was started at 3:40 PM.   Chief Complaint  Patient presents with  . Neck Pain  . Headache   The history is provided by the patient. No language interpreter was used.    HPI Comments: Madison Welch is a 37 y.o. female who presents to the Emergency Department with chief complaint of neck pain that started 5 days ago. She states she woke up with pain and stiffness. Pt has difficulty turning her head.  Pt has been taking ibuprofen and Aleve to help with the pain. Pt does not have history of such incident.  Pt denies fever, chills. He endorses associated headache, but states that the headache was gradual in onset, it was not thunderclap in nature.    Past Medical History  Diagnosis Date  . Asthma   . Legally blind   . Headache(784.0)   . Obesity    Past Surgical History  Procedure Laterality Date  . Tubal ligation    . Leg surgery    . Cesarean section    . Eye surgery    . Robotic assisted total hysterectomy  09/21/2012    Procedure: ROBOTIC ASSISTED TOTAL HYSTERECTOMY;  Surgeon: Dorien Chihuahua. Richardson Dopp, MD;  Location: [facility];  Service: Gynecology;  Laterality: N/A;  . Cystoscopy  09/21/2012    Procedure: CYSTOSCOPY;  Surgeon: Dorien Chihuahua. Richardson Dopp, MD;  Location: [facility];  Service: Gynecology;  Laterality: N/A;  with cystotomy repair  . Robotic assisted laparoscopic lysis of adhesion  09/21/2012    Procedure: ROBOTIC ASSISTED LAPAROSCOPIC LYSIS OF ADHESION;  Surgeon: Dorien Chihuahua. Richardson Dopp, MD;  Location: [facility];  Service: Gynecology;  Laterality: N/A;   History reviewed. No pertinent family history. History  Substance Use Topics  . Smoking status: Current Every Day Smoker  . Smokeless tobacco: Not on file  . Alcohol Use: Yes   OB History    Grav Para Term Preterm Abortions TAB SAB Ect Mult Living                 Review of Systems  A complete 10 system review of systems was obtained and all systems are negative except as noted in the HPI and PMH.   Allergies  Review of patient's allergies indicates no known allergies.  Home Medications   Current Outpatient Rx  Name  Route  Sig  Dispense  Refill  . albuterol (PROVENTIL HFA;VENTOLIN HFA) 108 (90 BASE) MCG/ACT inhaler   Inhalation   Inhale 2 puffs into the lungs every 6 (six) hours as needed. For shortness of breath         . ciprofloxacin (CIPRO) 500 MG tablet   Oral   Take 1 tablet (500 mg total) by mouth 2 (two) times daily.   7 tablet   0   . EXPIRED: fluticasone (FLONASE) 50 MCG/ACT nasal spray   Nasal   Place 2 sprays into the nose daily.   1 g   2   . ibuprofen (ADVIL,MOTRIN) 800 MG tablet   Oral   Take 800 mg by mouth every 8 (eight) hours as needed.         Marland Kitchen ibuprofen (ADVIL,MOTRIN) 800 MG tablet   Oral   Take 1 tablet (800 mg total) by mouth every  8 (eight) hours as needed.   30 tablet   1   . oxyCODONE-acetaminophen (PERCOCET/ROXICET) 5-325 MG per tablet   Oral   Take 1-2 tablets by mouth every 4 (four) hours as needed.   30 tablet   0    BP 132/61  Pulse 97  Temp(Src) 98.3 F (36.8 C) (Oral)  Resp 18  SpO2 100%  LMP 09/16/2012  Physical Exam  Nursing note and vitals reviewed. Constitutional: She is oriented to person, place, and time. She appears well-developed and well-nourished. No distress.  HENT:  Head: Normocephalic and atraumatic.  Eyes: EOM are normal.  Neck: Normal range of motion. Neck supple. No tracheal deviation present.  Normal ROM, no meningeal signs, no tenderness to palpation of posterior neck/spine, but mild tenderness of the upper trapezius    Cardiovascular: Normal rate.   Pulmonary/Chest: Effort normal. No respiratory distress.  Musculoskeletal: Normal range of motion.  Moves all extremities   Neurological: She is alert and oriented to person, place, and time.  Sensation and strength intact   Skin: Skin is warm and dry.  Psychiatric: She has a normal mood and affect. Her behavior is normal.    ED Course  Procedures DIAGNOSTIC STUDIES: Oxygen Saturation is 100% on RA, normal by my interpretation.    COORDINATION OF CARE: 3:44 PM-Discussed treatment plan which includes Toradol shot, muscle relaxer, and ibuprofen with pt at bedside and pt agreed to plan.   Labs Review Labs Reviewed - No data to display Imaging Review No results found.  MDM   1. Muscle cramps   2. Tension headache    Patient with muscle cramps of upper trapezius, and probable tension headache, no concerning symptoms of meningitis, no meningeal signs, no fevers, or chills, no thunderclap headache. Patient is neurovascularly intact.     I personally performed the services described in this documentation, which was scribed in my presence. The recorded information has been reviewed and is accurate.    Roxy Horseman, PA-C 07/01/13 1658

## 2013-07-02 NOTE — ED Provider Notes (Signed)
Medical screening examination/treatment/procedure(s) were performed by non-physician practitioner and as supervising physician I was immediately available for consultation/collaboration.  Flint Melter, MD 07/02/13 [phone]

## 2014-04-14 ENCOUNTER — Emergency Department (HOSPITAL_COMMUNITY): Payer: 59

## 2014-04-14 ENCOUNTER — Emergency Department (HOSPITAL_COMMUNITY)
Admission: EM | Admit: 2014-04-14 | Discharge: 2014-04-14 | Disposition: A | Discharge status: Home / Self Care | Payer: 59 | Attending: Emergency Medicine | Admitting: Emergency Medicine

## 2014-04-14 ENCOUNTER — Encounter (HOSPITAL_COMMUNITY): Payer: Self-pay | Admitting: Emergency Medicine

## 2014-04-14 DIAGNOSIS — F525 Vaginismus not due to a substance or known physiological condition: Secondary | ICD-10-CM | POA: Insufficient documentation

## 2014-04-14 DIAGNOSIS — H548 Legal blindness, as defined in USA: Secondary | ICD-10-CM | POA: Insufficient documentation

## 2014-04-14 DIAGNOSIS — Z9889 Other specified postprocedural states: Secondary | ICD-10-CM | POA: Insufficient documentation

## 2014-04-14 DIAGNOSIS — F17200 Nicotine dependence, unspecified, uncomplicated: Secondary | ICD-10-CM | POA: Insufficient documentation

## 2014-04-14 DIAGNOSIS — B9689 Other specified bacterial agents as the cause of diseases classified elsewhere: Secondary | ICD-10-CM | POA: Insufficient documentation

## 2014-04-14 DIAGNOSIS — E669 Obesity, unspecified: Secondary | ICD-10-CM | POA: Insufficient documentation

## 2014-04-14 DIAGNOSIS — N390 Urinary tract infection, site not specified: Secondary | ICD-10-CM

## 2014-04-14 DIAGNOSIS — N760 Acute vaginitis: Secondary | ICD-10-CM | POA: Insufficient documentation

## 2014-04-14 DIAGNOSIS — B379 Candidiasis, unspecified: Secondary | ICD-10-CM

## 2014-04-14 DIAGNOSIS — A499 Bacterial infection, unspecified: Secondary | ICD-10-CM | POA: Insufficient documentation

## 2014-04-14 DIAGNOSIS — J45909 Unspecified asthma, uncomplicated: Secondary | ICD-10-CM | POA: Insufficient documentation

## 2014-04-14 LAB — BASIC METABOLIC PANEL
BUN: 11 mg/dL (ref 6–23)
CO2: 25 meq/L (ref 19–32)
CREATININE: 0.69 mg/dL (ref 0.50–1.10)
Calcium: 8.8 mg/dL (ref 8.4–10.5)
Chloride: 99 mEq/L (ref 96–112)
GFR calc Af Amer: >90 mL/min (ref >90)
GFR calc non Af Amer: >90 mL/min (ref >90)
GLUCOSE: 80 mg/dL (ref 70–99)
POTASSIUM: 4 meq/L (ref 3.7–5.3)
Sodium: 137 mEq/L (ref 137–147)

## 2014-04-14 LAB — CBC WITH DIFFERENTIAL/PLATELET
BASOS PCT: 0 % (ref 0–1)
Basophils Absolute: 0 10*3/uL (ref 0.0–0.1)
Eosinophils Absolute: 0.2 10*3/uL (ref 0.0–0.7)
Eosinophils Relative: 2 % (ref 0–5)
HEMATOCRIT: 37.7 % (ref 36.0–46.0)
HEMOGLOBIN: 12.2 g/dL (ref 12.0–15.0)
LYMPHS ABS: 2.7 10*3/uL (ref 0.7–4.0)
LYMPHS PCT: 24 % (ref 12–46)
MCH: 28.9 pg (ref 26.0–34.0)
MCHC: 32.4 g/dL (ref 30.0–36.0)
MCV: 89.3 fL (ref 78.0–100.0)
MONO ABS: 0.6 10*3/uL (ref 0.1–1.0)
MONOS PCT: 5 % (ref 3–12)
NEUTROS ABS: 8 10*3/uL — AB (ref 1.7–7.7)
Neutrophils Relative %: 69 % (ref 43–77)
Platelets: 250 10*3/uL (ref 150–400)
RBC: 4.22 MIL/uL (ref 3.87–5.11)
RDW: 14.2 % (ref 11.5–15.5)
WBC: 11.5 10*3/uL — ABNORMAL HIGH (ref 4.0–10.5)

## 2014-04-14 LAB — URINALYSIS, ROUTINE W REFLEX MICROSCOPIC
Bilirubin Urine: NEGATIVE
GLUCOSE, UA: NEGATIVE mg/dL
Ketones, ur: NEGATIVE mg/dL
NITRITE: NEGATIVE
PH: 5.5 (ref 5.0–8.0)
PROTEIN: NEGATIVE mg/dL
Specific Gravity, Urine: 1.025 (ref 1.005–1.030)
Urobilinogen, UA: 1 mg/dL (ref 0.0–1.0)

## 2014-04-14 LAB — WET PREP, GENITAL: Trich, Wet Prep: NONE SEEN

## 2014-04-14 LAB — URINE MICROSCOPIC-ADD ON

## 2014-04-14 MED ORDER — FLUCONAZOLE 150 MG PO TABS: 150.0000 mg | ORAL_TABLET | Freq: Once | ORAL | Status: DC

## 2014-04-14 MED ORDER — ONDANSETRON HCL 4 MG/2ML IJ SOLN
4.0000 mg | Freq: Once | INTRAMUSCULAR | Status: AC
  Administered 2014-04-14: 4 mg via INTRAVENOUS
  Filled 2014-04-14: qty 2

## 2014-04-14 MED ORDER — METRONIDAZOLE 500 MG PO TABS: 500.0000 mg | ORAL_TABLET | Freq: Two times a day (BID) | ORAL | Status: DC

## 2014-04-14 MED ORDER — CIPROFLOXACIN HCL 500 MG PO TABS: 500.0000 mg | ORAL_TABLET | Freq: Two times a day (BID) | ORAL | Status: DC

## 2014-04-14 MED ORDER — MORPHINE SULFATE 4 MG/ML IJ SOLN
4.0000 mg | Freq: Once | INTRAMUSCULAR | Status: AC
  Administered 2014-04-14: 4 mg via INTRAVENOUS
  Filled 2014-04-14: qty 1

## 2014-04-14 NOTE — ED Notes (Signed)
Pt reports pelvic discomfort on the right side for the past 3 days. No vaginal discharge or bleeding. Denies any pain with intercourse.

## 2014-04-14 NOTE — ED Notes (Signed)
Pt reports Right lower pelvic pain for a few days, accompanied with painful urination. Denies discharge. Pt is sexually active. States she had a partial hysterectomy.

## 2014-04-14 NOTE — Discharge Instructions (Signed)
Bacterial Vaginosis °Bacterial vaginosis is a vaginal infection that occurs when the normal balance of bacteria in the vagina is disrupted. It results from an overgrowth of certain bacteria. This is the most common vaginal infection in women of childbearing age. Treatment is important to prevent complications, especially in pregnant women, as it can cause a premature delivery. °CAUSES  °Bacterial vaginosis is caused by an increase in harmful bacteria that are normally present in smaller amounts in the vagina. Several different kinds of bacteria can cause bacterial vaginosis. However, the reason that the condition develops is not fully understood. °RISK FACTORS °Certain activities or behaviors can put you at an increased risk of developing bacterial vaginosis, including: °· Having a new sex partner or multiple sex partners. °· Douching. °· Using an intrauterine device (IUD) for contraception. °Women do not get bacterial vaginosis from toilet seats, bedding, swimming pools, or contact with objects around them. °SIGNS AND SYMPTOMS  °Some women with bacterial vaginosis have no signs or symptoms. Common symptoms include: °· Grey vaginal discharge. °· A fishlike odor with discharge, especially after sexual intercourse. °· Itching or burning of the vagina and vulva. °· Burning or pain with urination. °DIAGNOSIS  °Your health care provider will take a medical history and examine the vagina for signs of bacterial vaginosis. A sample of vaginal fluid may be taken. Your health care provider will look at this sample under a microscope to check for bacteria and abnormal cells. A vaginal pH test may also be done.  °TREATMENT  °Bacterial vaginosis may be treated with antibiotic medicines. These may be given in the form of a pill or a vaginal cream. A second round of antibiotics may be prescribed if the condition comes back after treatment.  °HOME CARE INSTRUCTIONS  °· Only take over-the-counter or prescription medicines as  directed by your health care provider. °· If antibiotic medicine was prescribed, take it as directed. Make sure you finish it even if you start to feel better. °· Do not have sex until treatment is completed. °· Tell all sexual partners that you have a vaginal infection. They should see their health care provider and be treated if they have problems, such as a mild rash or itching. °· Practice safe sex by using condoms and only having one sex partner. °SEEK MEDICAL CARE IF:  °· Your symptoms are not improving after 3 days of treatment. °· You have increased discharge or pain. °· You have a fever. °MAKE SURE YOU:  °· Understand these instructions. °· Will watch your condition. °· Will get help right away if you are not doing well or get worse. °FOR MORE INFORMATION  °Centers for Disease Control and Prevention, Division of STD Prevention: www.cdc.gov/std °American Sexual Health Association (ASHA): www.ashastd.org  °Document Released: 10/05/2005 Document Revised: 07/26/2013 Document Reviewed: 05/17/2013 °ExitCare® Patient Information ©2015 [employer]. This information is not intended to replace advice given to you by your health care provider. Make sure you discuss any questions you have with your health care provider. °Urinary Tract Infection °Urinary tract infections (UTIs) can develop anywhere along your urinary tract. Your urinary tract is your body's drainage system for removing wastes and extra water. Your urinary tract includes two kidneys, two ureters, a bladder, and a urethra. Your kidneys are a pair of bean-shaped organs. Each kidney is about the size of your fist. They are located below your ribs, one on each side of your spine. °CAUSES °Infections are caused by microbes, which are microscopic organisms, including fungi, viruses, and   bacteria. These organisms are so small that they can only be seen through a microscope. Bacteria are the microbes that most commonly cause UTIs. SYMPTOMS  Symptoms of UTIs  may vary by age and gender of the patient and by the location of the infection. Symptoms in young women typically include a frequent and intense urge to urinate and a painful, burning feeling in the bladder or urethra during urination. Older women and men are more likely to be tired, shaky, and weak and have muscle aches and abdominal pain. A fever may mean the infection is in your kidneys. Other symptoms of a kidney infection include pain in your back or sides below the ribs, nausea, and vomiting. DIAGNOSIS To diagnose a UTI, your caregiver will ask you about your symptoms. Your caregiver also will ask to provide a urine sample. The urine sample will be tested for bacteria and white blood cells. White blood cells are made by your body to help fight infection. TREATMENT  Typically, UTIs can be treated with medication. Because most UTIs are caused by a bacterial infection, they usually can be treated with the use of antibiotics. The choice of antibiotic and length of treatment depend on your symptoms and the type of bacteria causing your infection. HOME CARE INSTRUCTIONS  If you were prescribed antibiotics, take them exactly as your caregiver instructs you. Finish the medication even if you feel better after you have only taken some of the medication.  Drink enough water and fluids to keep your urine clear or pale yellow.  Avoid caffeine, tea, and carbonated beverages. They tend to irritate your bladder.  Empty your bladder often. Avoid holding urine for long periods of time.  Empty your bladder before and after sexual intercourse.  After a bowel movement, women should cleanse from front to back. Use each tissue only once. SEEK MEDICAL CARE IF:   You have back pain.  You develop a fever.  Your symptoms do not begin to resolve within 3 days. SEEK IMMEDIATE MEDICAL CARE IF:   You have severe back pain or lower abdominal pain.  You develop chills.  You have nausea or vomiting.  You have  continued burning or discomfort with urination. MAKE SURE YOU:   Understand these instructions.  Will watch your condition.  Will get help right away if you are not doing well or get worse. Document Released: 07/15/2005 Document Revised: 04/05/2012 Document Reviewed: 11/13/2011 [facility] Patient Information 2015 [place], [place]. This information is not intended to replace advice given to you by your health care provider. Make sure you discuss any questions you have with your health care provider. Candida Infection, Adult A candida infection (also called yeast, fungus and Monilia infection) is an overgrowth of yeast that can occur anywhere on the body. A yeast infection commonly occurs in warm, moist body areas. Usually, the infection remains localized but can spread to become a systemic infection. A yeast infection may be a sign of a more severe disease such as diabetes, leukemia, or AIDS. A yeast infection can occur in both men and women. In women, Candida vaginitis is a vaginal infection. It is one of the most common causes of vaginitis. Men usually do not have symptoms or know they have an infection until other problems develop. Men may find out they have a yeast infection because their sex partner has a yeast infection. Uncircumcised men are more likely to get a yeast infection than circumcised men. This is because the uncircumcised glans is not exposed to air  and does not remain as dry as that of a circumcised glans. Older adults may develop yeast infections around dentures. CAUSES  Women  Antibiotics.  Steroid medication taken for a long time.  Being overweight (obese).  Diabetes.  Poor immune condition.  Certain serious medical conditions.  Immune suppressive medications for organ transplant patients.  Chemotherapy.  Pregnancy.  Menstration.  Stress and fatigue.  Intravenous drug use.  Oral contraceptives.  Wearing tight-fitting clothes in the crotch area.  Catching  it from a sex partner who has a yeast infection.  Spermicide.  Intravenous, urinary, or other catheters. Men  Catching it from a sex partner who has a yeast infection.  Having oral or anal sex with a person who has the infection.  Spermicide.  Diabetes.  Antibiotics.  Poor immune system.  Medications that suppress the immune system.  Intravenous drug use.  Intravenous, urinary, or other catheters. SYMPTOMS  Women  Thick, white vaginal discharge.  Vaginal itching.  Redness and swelling in and around the vagina.  Irritation of the lips of the vagina and perineum.  Blisters on the vaginal lips and perineum.  Painful sexual intercourse.  Low blood sugar (hypoglycemia).  Painful urination.  Bladder infections.  Intestinal problems such as constipation, indigestion, bad breath, bloating, increase in gas, diarrhea, or loose stools. Men  Men may develop intestinal problems such as constipation, indigestion, bad breath, bloating, increase in gas, diarrhea, or loose stools.  Dry, cracked skin on the penis with itching or discomfort.  Jock itch.  Dry, flaky skin.  Athlete's foot.  Hypoglycemia. DIAGNOSIS  Women  A history and an exam are performed.  The discharge may be examined under a microscope.  A culture may be taken of the discharge. Men  A history and an exam are performed.  Any discharge from the penis or areas of cracked skin will be looked at under the microscope and cultured.  Stool samples may be cultured. TREATMENT  Women  Vaginal antifungal suppositories and creams.  Medicated creams to decrease irritation and itching on the outside of the vagina.  Warm compresses to the perineal area to decrease swelling and discomfort.  Oral antifungal medications.  Medicated vaginal suppositories or cream for repeated or recurrent infections.  Wash and dry the irritation areas before applying the cream.  Eating yogurt with lactobacillus  may help with prevention and treatment.  Sometimes painting the vagina with gentian violet solution may help if creams and suppositories do not work. Men  Antifungal creams and oral antifungal medications.  Sometimes treatment must continue for 30 days after the symptoms go away to prevent recurrence. HOME CARE INSTRUCTIONS  Women  Use cotton underwear and avoid tight-fitting clothing.  Avoid colored, scented toilet paper and deodorant tampons or pads.  Do not douche.  Keep your diabetes under control.  Finish all the prescribed medications.  Keep your skin clean and dry.  Consume milk or yogurt with lactobacillus active culture regularly. If you get frequent yeast infections and think that is what the infection is, there are over-the-counter medications that you can get. If the infection does not show healing in 3 days, talk to your caregiver.  Tell your sex partner you have a yeast infection. Your partner may need treatment also, especially if your infection does not clear up or recurs. Men  Keep your skin clean and dry.  Keep your diabetes under control.  Finish all prescribed medications.  Tell your sex partner that you have a yeast infection so they can  be treated if necessary. SEEK MEDICAL CARE IF:   Your symptoms do not clear up or worsen in one week after treatment.  You have an oral temperature above 102 F (38.9 C).  You have trouble swallowing or eating for a prolonged time.  You develop blisters on and around your vagina.  You develop vaginal bleeding and it is not your menstrual period.  You develop abdominal pain.  You develop intestinal problems as mentioned above.  You get weak or lightheaded.  You have painful or increased urination.  You have pain during sexual intercourse. MAKE SURE YOU:   Understand these instructions.  Will watch your condition.  Will get help right away if you are not doing well or get worse. Document Released:  11/12/2004 Document Revised: 12/28/2011 Document Reviewed: 02/24/2010 [facility] Patient Information 2015 [place], [place]. This information is not intended to replace advice given to you by your health care provider. Make sure you discuss any questions you have with your health care provider.

## 2014-04-14 NOTE — ED Provider Notes (Signed)
CSN: [id number]     Arrival date & time 04/14/14  1637 History   First MD Initiated Contact with Patient 04/14/14 1757     Chief Complaint  Patient presents with  . Pelvic Pain     (Consider location/radiation/quality/duration/timing/severity/associated sxs/prior Treatment) HPI Comments: Patient presents emergency department with chief complaint of right-sided pelvic pain x3 days. She states the pain is progressively worsened, with today being the worst. She states the pain is 8/10. She is tried taking Tylenol with no relief. She denies any vaginal discharge or bleeding. She does endorse associated nausea, and dysuria. She denies any vomiting, diarrhea, or constipation. She reports a history of partial hysterectomy. She states that she still has her ovaries. She denies any other abdominal surgeries.  The history is provided by the patient. No language interpreter was used.    Past Medical History  Diagnosis Date  . Asthma   . Legally blind   . Headache(784.0)   . Obesity    Past Surgical History  Procedure Laterality Date  . Tubal ligation    . Leg surgery    . Cesarean section    . Eye surgery    . Robotic assisted total hysterectomy  09/21/2012    Procedure: ROBOTIC ASSISTED TOTAL HYSTERECTOMY;  Surgeon: Maeola Sarah. Landry Mellow, MD;  Location: [facility];  Service: Gynecology;  Laterality: N/A;  . Cystoscopy  09/21/2012    Procedure: CYSTOSCOPY;  Surgeon: Maeola Sarah. Landry Mellow, MD;  Location: [facility];  Service: Gynecology;  Laterality: N/A;  with cystotomy repair  . Robotic assisted laparoscopic lysis of adhesion  09/21/2012    Procedure: ROBOTIC ASSISTED LAPAROSCOPIC LYSIS OF ADHESION;  Surgeon: Maeola Sarah. Landry Mellow, MD;  Location: [facility];  Service: Gynecology;  Laterality: N/A;  . Abdominal hysterectomy      partial   No family history on file. History  Substance Use Topics  . Smoking status: Current Every Day Smoker  . Smokeless tobacco: Not on file  . Alcohol Use: Yes   OB History   Grav Para Term  Preterm Abortions TAB SAB Ect Mult Living                 Review of Systems  All other systems reviewed and are negative.     Allergies  Review of patient's allergies indicates no known allergies.  Home Medications   Prior to Admission medications   Medication Sig Start Date End Date Taking? Authorizing Amond Speranza  acetaminophen (TYLENOL) 500 MG tablet Take 1,000 mg by mouth every 6 (six) hours as needed.   Yes Historical Kemar Pandit, MD  albuterol (PROVENTIL HFA;VENTOLIN HFA) 108 (90 BASE) MCG/ACT inhaler Inhale 2 puffs into the lungs every 6 (six) hours as needed. For shortness of breath   Yes Historical Korben Carcione, MD  ibuprofen (ADVIL,MOTRIN) 200 MG tablet Take 800 mg by mouth every 6 (six) hours as needed for pain.    Yes Historical Crispin Vogel, MD   BP 128/73  Pulse 75  Temp(Src) 98.9 F (37.2 C) (Oral)  Resp 18  SpO2 97%  LMP 09/16/2012 Physical Exam  Nursing note and vitals reviewed. Constitutional: She is oriented to person, place, and time. She appears well-developed and well-nourished.  HENT:  Head: Normocephalic and atraumatic.  Eyes: Conjunctivae and EOM are normal. Pupils are equal, round, and reactive to light.  Neck: Normal range of motion. Neck supple.  Cardiovascular: Normal rate and regular rhythm.  Exam reveals no gallop and no friction rub.   No murmur heard. Pulmonary/Chest: Effort normal and  breath sounds normal. No respiratory distress. She has no wheezes. She has no rales. She exhibits no tenderness.  Abdominal: Soft. Bowel sounds are normal. She exhibits no distension and no mass. There is no tenderness. There is no rebound and no guarding.  No focal abdominal tenderness, no RLQ tenderness or pain at McBurney's point, no RUQ tenderness or Murphy's sign, no left-sided abdominal tenderness, no fluid wave, or signs of peritonitis   Genitourinary:  Pelvic exam chaperoned by female ER tech, moderate to severe right adnexal tenderness, no left adnexal tenderness,  no uterine tenderness, no vaginal discharge or bleeding, no CMT or friability, no foreign body, no injury to the external genitalia, no other significant findings   Musculoskeletal: Normal range of motion. She exhibits no edema and no tenderness.  Neurological: She is alert and oriented to person, place, and time.  Skin: Skin is warm and dry.  Psychiatric: She has a normal mood and affect. Her behavior is normal. Judgment and thought content normal.    ED Course  Procedures (including critical care time) Results for orders placed during the hospital encounter of 04/14/14  WET PREP, GENITAL      Result Value Ref Range   Yeast Wet Prep HPF POC MODERATE (*) NONE SEEN   Trich, Wet Prep NONE SEEN  NONE SEEN   Clue Cells Wet Prep HPF POC FEW (*) NONE SEEN   WBC, Wet Prep HPF POC RARE (*) NONE SEEN  URINALYSIS, ROUTINE W REFLEX MICROSCOPIC      Result Value Ref Range   Color, Urine YELLOW  YELLOW   APPearance CLOUDY (*) CLEAR   Specific Gravity, Urine 1.025  1.005 - 1.030   pH 5.5  5.0 - 8.0   Glucose, UA NEGATIVE  NEGATIVE mg/dL   Hgb urine dipstick TRACE (*) NEGATIVE   Bilirubin Urine NEGATIVE  NEGATIVE   Ketones, ur NEGATIVE  NEGATIVE mg/dL   Protein, ur NEGATIVE  NEGATIVE mg/dL   Urobilinogen, UA 1.0  0.0 - 1.0 mg/dL   Nitrite NEGATIVE  NEGATIVE   Leukocytes, UA MODERATE (*) NEGATIVE  URINE MICROSCOPIC-ADD ON      Result Value Ref Range   Squamous Epithelial / LPF MANY (*) RARE   WBC, UA 3-6  <3 WBC/hpf   RBC / HPF 0-2  <3 RBC/hpf   Bacteria, UA RARE  RARE   Urine-Other FEW YEAST    CBC WITH DIFFERENTIAL      Result Value Ref Range   WBC 11.5 (*) 4.0 - 10.5 K/uL   RBC 4.22  3.87 - 5.11 MIL/uL   Hemoglobin 12.2  12.0 - 15.0 g/dL   HCT 37.7  36.0 - 46.0 %   MCV 89.3  78.0 - 100.0 fL   MCH 28.9  26.0 - 34.0 pg   MCHC 32.4  30.0 - 36.0 g/dL   RDW 14.2  11.5 - 15.5 %   Platelets 250  150 - 400 K/uL   Neutrophils Relative % 69  43 - 77 %   Neutro Abs 8.0 (*) 1.7 - 7.7 K/uL    Lymphocytes Relative 24  12 - 46 %   Lymphs Abs 2.7  0.7 - 4.0 K/uL   Monocytes Relative 5  3 - 12 %   Monocytes Absolute 0.6  0.1 - 1.0 K/uL   Eosinophils Relative 2  0 - 5 %   Eosinophils Absolute 0.2  0.0 - 0.7 K/uL   Basophils Relative 0  0 - 1 %   Basophils Absolute  0.0  0.0 - 0.1 K/uL  BASIC METABOLIC PANEL      Result Value Ref Range   Sodium 137  137 - 147 mEq/L   Potassium 4.0  3.7 - 5.3 mEq/L   Chloride 99  96 - 112 mEq/L   CO2 25  19 - 32 mEq/L   Glucose, Bld 80  70 - 99 mg/dL   BUN 11  6 - 23 mg/dL   Creatinine, Ser 0.69  0.50 - 1.10 mg/dL   Calcium 8.8  8.4 - 10.5 mg/dL   GFR calc non Af Amer >90  >90 mL/min   GFR calc Af Amer >90  >90 mL/min   US Transvaginal Non-ob  04/14/2014   CLINICAL DATA:  Right-sided pain. Partial hysterectomy. No bleeding.  EXAM: TRANSABDOMINAL AND TRANSVAGINAL ULTRASOUND OF PELVIS  DOPPLER ULTRASOUND OF OVARIES  TECHNIQUE: Both transabdominal and transvaginal ultrasound examinations of the pelvis were performed. Transabdominal technique was performed for global imaging of the pelvis including uterus, ovaries, adnexal regions, and pelvic cul-de-sac.  It was necessary to proceed with endovaginal exam following the transabdominal exam to visualize the uterus, ovaries, and adnexa. Color and duplex Doppler ultrasound was utilized to evaluate blood flow to the ovaries.  COMPARISON:  06/10/2012  FINDINGS: Uterus:  Surgically absent  Right Ovary:  Not visualized.  No adnexal mass seen.  Left Ovary: 2.4 x 1.6 x 0 2.4 cm. Probable exophytic follicle adjacent the left ovary at 1.4 cm. Simple in appearance.  Other Findings:  No significant free fluid.  Exam degraded by patient body habitus.  Pulsed Doppler evaluation of the left ovary demonstrates normal low-resistance arterial and venous waveforms.  IMPRESSION:  1. Right ovary not visualized. No adnexal mass seen. Exam degraded by overlying bowel gas and patient body habitus. 2. No evidence of suspicious left  ovarian mass or left ovarian/adnexal torsion. 3. Hysterectomy.   Electronically Signed   By: Abigail Miyamoto M.D.   On: 04/14/2014 20:32   US Pelvis Complete  04/14/2014   CLINICAL DATA:  Right-sided pain. Partial hysterectomy. No bleeding.  EXAM: TRANSABDOMINAL AND TRANSVAGINAL ULTRASOUND OF PELVIS  DOPPLER ULTRASOUND OF OVARIES  TECHNIQUE: Both transabdominal and transvaginal ultrasound examinations of the pelvis were performed. Transabdominal technique was performed for global imaging of the pelvis including uterus, ovaries, adnexal regions, and pelvic cul-de-sac.  It was necessary to proceed with endovaginal exam following the transabdominal exam to visualize the uterus, ovaries, and adnexa. Color and duplex Doppler ultrasound was utilized to evaluate blood flow to the ovaries.  COMPARISON:  06/10/2012  FINDINGS: Uterus:  Surgically absent  Right Ovary:  Not visualized.  No adnexal mass seen.  Left Ovary: 2.4 x 1.6 x 0 2.4 cm. Probable exophytic follicle adjacent the left ovary at 1.4 cm. Simple in appearance.  Other Findings:  No significant free fluid.  Exam degraded by patient body habitus.  Pulsed Doppler evaluation of the left ovary demonstrates normal low-resistance arterial and venous waveforms.  IMPRESSION:  1. Right ovary not visualized. No adnexal mass seen. Exam degraded by overlying bowel gas and patient body habitus. 2. No evidence of suspicious left ovarian mass or left ovarian/adnexal torsion. 3. Hysterectomy.   Electronically Signed   By: Abigail Miyamoto M.D.   On: 04/14/2014 20:32   [place] Art/ven Flow Abd Pelv Doppler  04/14/2014   CLINICAL DATA:  Right-sided pain. Partial hysterectomy. No bleeding.  EXAM: TRANSABDOMINAL AND TRANSVAGINAL ULTRASOUND OF PELVIS  DOPPLER ULTRASOUND OF OVARIES  TECHNIQUE: Both transabdominal and transvaginal ultrasound  examinations of the pelvis were performed. Transabdominal technique was performed for global imaging of the pelvis including uterus, ovaries, adnexal  regions, and pelvic cul-de-sac.  It was necessary to proceed with endovaginal exam following the transabdominal exam to visualize the uterus, ovaries, and adnexa. Color and duplex Doppler ultrasound was utilized to evaluate blood flow to the ovaries.  COMPARISON:  06/10/2012  FINDINGS: Uterus:  Surgically absent  Right Ovary:  Not visualized.  No adnexal mass seen.  Left Ovary: 2.4 x 1.6 x 0 2.4 cm. Probable exophytic follicle adjacent the left ovary at 1.4 cm. Simple in appearance.  Other Findings:  No significant free fluid.  Exam degraded by patient body habitus.  Pulsed Doppler evaluation of the left ovary demonstrates normal low-resistance arterial and venous waveforms.  IMPRESSION:  1. Right ovary not visualized. No adnexal mass seen. Exam degraded by overlying bowel gas and patient body habitus. 2. No evidence of suspicious left ovarian mass or left ovarian/adnexal torsion. 3. Hysterectomy.   Electronically Signed   By: Abigail Miyamoto M.D.   On: 04/14/2014 20:32      EKG Interpretation None      MDM   Final diagnoses:  Yeast infection  UTI (lower urinary tract infection)  BV (bacterial vaginosis)    Patient with right-sided pelvic pain and dysuria. Urinalysis remarkable for some white blood cells, consider UTI in the presence of dysuria. However we'll also check pelvic exam, and possibly ultrasound of right ovary.  [place] is reassuring.  No abdominal tenderness.  No pain at McBurney's point.  No fever.  Highly doubt appy.  Return precautions given.  Patient understands and agrees with the plan.  Discussed the patient with Dr. Darl Householder.  Will treat with cipro, flagyl, and diflucan for yeast, BV, and UTI.  Suspect that the UTI is the source of the lower abdominal tenderness.    Montine Circle, PA-C 04/14/14 2212

## 2014-04-16 LAB — HIV ANTIBODY (ROUTINE TESTING W REFLEX): HIV 1&2 Ab, 4th Generation: NONREACTIVE

## 2014-04-16 LAB — GC/CHLAMYDIA PROBE AMP
CT PROBE, AMP APTIMA: NEGATIVE
GC PROBE AMP APTIMA: NEGATIVE

## 2014-04-17 NOTE — ED Provider Notes (Signed)
Medical screening examination/treatment/procedure(s) were performed by non-physician practitioner and as supervising physician I was immediately available for consultation/collaboration.   EKG Interpretation None        Wandra Arthurs, MD 04/17/14 [phone]

## 2014-11-05 ENCOUNTER — Emergency Department (HOSPITAL_COMMUNITY)
Admission: EM | Admit: 2014-11-05 | Discharge: 2014-11-06 | Disposition: A | Discharge status: Home / Self Care | Payer: 59 | Attending: Emergency Medicine | Admitting: Emergency Medicine

## 2014-11-05 ENCOUNTER — Encounter (HOSPITAL_COMMUNITY): Payer: Self-pay | Admitting: Emergency Medicine

## 2014-11-05 ENCOUNTER — Emergency Department (HOSPITAL_COMMUNITY): Payer: 59

## 2014-11-05 DIAGNOSIS — Z3202 Encounter for pregnancy test, result negative: Secondary | ICD-10-CM | POA: Insufficient documentation

## 2014-11-05 DIAGNOSIS — R51 Headache: Secondary | ICD-10-CM | POA: Diagnosis not present

## 2014-11-05 DIAGNOSIS — J45909 Unspecified asthma, uncomplicated: Secondary | ICD-10-CM | POA: Insufficient documentation

## 2014-11-05 DIAGNOSIS — Z8679 Personal history of other diseases of the circulatory system: Secondary | ICD-10-CM | POA: Diagnosis not present

## 2014-11-05 DIAGNOSIS — E669 Obesity, unspecified: Secondary | ICD-10-CM | POA: Diagnosis not present

## 2014-11-05 DIAGNOSIS — Z79899 Other long term (current) drug therapy: Secondary | ICD-10-CM | POA: Diagnosis not present

## 2014-11-05 DIAGNOSIS — H548 Legal blindness, as defined in USA: Secondary | ICD-10-CM | POA: Diagnosis not present

## 2014-11-05 DIAGNOSIS — R519 Headache, unspecified: Secondary | ICD-10-CM

## 2014-11-05 DIAGNOSIS — Z720 Tobacco use: Secondary | ICD-10-CM | POA: Diagnosis not present

## 2014-11-05 LAB — CBC WITH DIFFERENTIAL/PLATELET
Basophils Absolute: 0 10*3/uL (ref 0.0–0.1)
Basophils Relative: 0 % (ref 0–1)
EOS PCT: 3 % (ref 0–5)
Eosinophils Absolute: 0.3 10*3/uL (ref 0.0–0.7)
HEMATOCRIT: 33.5 % — AB (ref 36.0–46.0)
HEMOGLOBIN: 11 g/dL — AB (ref 12.0–15.0)
LYMPHS PCT: 30 % (ref 12–46)
Lymphs Abs: 3.3 10*3/uL (ref 0.7–4.0)
MCH: 28.6 pg (ref 26.0–34.0)
MCHC: 32.8 g/dL (ref 30.0–36.0)
MCV: 87 fL (ref 78.0–100.0)
Monocytes Absolute: 0.6 10*3/uL (ref 0.1–1.0)
Monocytes Relative: 5 % (ref 3–12)
NEUTROS ABS: 6.7 10*3/uL (ref 1.7–7.7)
NEUTROS PCT: 62 % (ref 43–77)
Platelets: 234 10*3/uL (ref 150–400)
RBC: 3.85 MIL/uL — ABNORMAL LOW (ref 3.87–5.11)
RDW: 14.8 % (ref 11.5–15.5)
WBC: 10.9 10*3/uL — ABNORMAL HIGH (ref 4.0–10.5)

## 2014-11-05 LAB — BASIC METABOLIC PANEL
Anion gap: 4 — ABNORMAL LOW (ref 5–15)
BUN: 13 mg/dL (ref 6–23)
CHLORIDE: 105 meq/L (ref 96–112)
CO2: 28 mmol/L (ref 19–32)
CREATININE: 1.11 mg/dL — AB (ref 0.50–1.10)
Calcium: 8.4 mg/dL (ref 8.4–10.5)
GFR calc Af Amer: 72 mL/min — ABNORMAL LOW (ref >90)
GFR, EST NON AFRICAN AMERICAN: 62 mL/min — AB (ref >90)
GLUCOSE: 91 mg/dL (ref 70–99)
POTASSIUM: 3.6 mmol/L (ref 3.5–5.1)
Sodium: 137 mmol/L (ref 135–145)

## 2014-11-05 LAB — URINALYSIS, ROUTINE W REFLEX MICROSCOPIC
Bilirubin Urine: NEGATIVE
Glucose, UA: NEGATIVE mg/dL
HGB URINE DIPSTICK: NEGATIVE
KETONES UR: NEGATIVE mg/dL
Leukocytes, UA: NEGATIVE
NITRITE: NEGATIVE
PH: 6.5 (ref 5.0–8.0)
Protein, ur: NEGATIVE mg/dL
Specific Gravity, Urine: 1.022 (ref 1.005–1.030)
UROBILINOGEN UA: 1 mg/dL (ref 0.0–1.0)

## 2014-11-05 LAB — PREGNANCY, URINE: Preg Test, Ur: NEGATIVE

## 2014-11-05 MED ORDER — DIPHENHYDRAMINE HCL 50 MG/ML IJ SOLN
25.0000 mg | Freq: Once | INTRAMUSCULAR | Status: AC
  Administered 2014-11-05: 25 mg via INTRAVENOUS
  Filled 2014-11-05: qty 1

## 2014-11-05 MED ORDER — SODIUM CHLORIDE 0.9 % IV BOLUS (SEPSIS)
500.0000 mL | Freq: Once | INTRAVENOUS | Status: AC
  Administered 2014-11-05: 500 mL via INTRAVENOUS

## 2014-11-05 MED ORDER — ONDANSETRON HCL 4 MG/2ML IJ SOLN
4.0000 mg | Freq: Once | INTRAMUSCULAR | Status: AC
  Administered 2014-11-05: 4 mg via INTRAVENOUS
  Filled 2014-11-05: qty 2

## 2014-11-05 MED ORDER — METOCLOPRAMIDE HCL 5 MG/ML IJ SOLN
10.0000 mg | Freq: Once | INTRAMUSCULAR | Status: AC
  Administered 2014-11-05: 10 mg via INTRAVENOUS
  Filled 2014-11-05: qty 2

## 2014-11-05 NOTE — ED Notes (Signed)
Pt. reports migraine headache onset last Monday with left eye blurred vision , nausea and vomitting unrelieved by OTC pain medications .

## 2014-11-05 NOTE — ED Provider Notes (Signed)
CSN: [id number]     Arrival date & time 11/05/14  2202 History   First MD Initiated Contact with Patient 11/05/14 2228     Chief Complaint  Patient presents with  . Migraine     (Consider location/radiation/quality/duration/timing/severity/associated sxs/prior Treatment) HPI The patient reports she's had a headache for one week. It has been gradual in onset. The headache is generalized and aching in nature. Patient has not had associated fever or neck stiffness. 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 She does feel she is developed some blurring of her vision intermittently comes and goes. She reports that as of yesterday she developed several episodes of vomiting as well. The patient ports that she does have a history of migraines but has not had one in several years. She has not had sinus drainage discharge or sore throat. She reports she feels somewhat dizzy if she bends over and then stands up. The patient poor she has had some mild cough for about 2 days. She reports sometimes she gets an asthma exacerbation. She denies shortness of breath. She's not had any associated fever or sputum production. Past Medical History  Diagnosis Date  . Asthma   . Legally blind   . Headache(784.0)   . Obesity    Past Surgical History  Procedure Laterality Date  . Tubal ligation    . Leg surgery    . Cesarean section    . Eye surgery    . Robotic assisted total hysterectomy  09/21/2012    Procedure: ROBOTIC ASSISTED TOTAL HYSTERECTOMY;  Surgeon: Maeola Sarah. Landry Mellow, MD;  Location: [facility];  Service: Gynecology;  Laterality: N/A;  . Cystoscopy  09/21/2012    Procedure: CYSTOSCOPY;  Surgeon: Maeola Sarah. Landry Mellow, MD;  Location: [facility];  Service: Gynecology;  Laterality: N/A;  with cystotomy repair  . Robotic assisted laparoscopic lysis of adhesion  09/21/2012    Procedure: ROBOTIC ASSISTED LAPAROSCOPIC LYSIS OF ADHESION;  Surgeon: Maeola Sarah. Landry Mellow, MD;  Location: [facility];  Service: Gynecology;  Laterality: N/A;   . Abdominal hysterectomy      partial   No family history on file. History  Substance Use Topics  . Smoking status: Current Every Day Smoker  . Smokeless tobacco: Not on file  . Alcohol Use: Yes   OB History    No data available     Review of Systems 10 Systems reviewed and are negative for acute change except as noted in the HPI.    Allergies  Review of patient's allergies indicates no known allergies.  Home Medications   Prior to Admission medications   Medication Sig Start Date End Date Taking? Authorizing Provider  acetaminophen (TYLENOL) 500 MG tablet Take 1,000 mg by mouth every 6 (six) hours as needed for mild pain.    Yes Historical Provider, MD  albuterol (PROVENTIL HFA;VENTOLIN HFA) 108 (90 BASE) MCG/ACT inhaler Inhale 2 puffs into the lungs every 6 (six) hours as needed. For shortness of breath   Yes Historical Provider, MD  ibuprofen (ADVIL,MOTRIN) 200 MG tablet Take 800 mg by mouth every 6 (six) hours as needed for pain.    Yes Historical Provider, MD  ciprofloxacin (CIPRO) 500 MG tablet Take 1 tablet (500 mg total) by mouth 2 (two) times daily. Patient not taking: Reported on 11/05/2014 04/14/14   Montine Circle, PA-C  fluconazole (DIFLUCAN) 150 MG tablet Take 1 tablet (150 mg total) by mouth once. Take once finished with antibiotics Patient not taking: Reported on 11/05/2014  04/14/14   Montine Circle, PA-C  ibuprofen (ADVIL,MOTRIN) 600 MG tablet Take 1 tablet (600 mg total) by mouth every 6 (six) hours as needed. 11/06/14   Charlesetta Shanks, MD  metroNIDAZOLE (FLAGYL) 500 MG tablet Take 1 tablet (500 mg total) by mouth 2 (two) times daily. Patient not taking: Reported on 11/05/2014 04/14/14   Montine Circle, PA-C  ondansetron (ZOFRAN ODT) 4 MG disintegrating tablet Take 1 tablet (4 mg total) by mouth every 4 (four) hours as needed for nausea or vomiting. 11/06/14   Charlesetta Shanks, MD  traMADol (ULTRAM) 50 MG tablet Take 2 tablets (100 mg total) by mouth every 6 (six)  hours as needed. 11/06/14   Charlesetta Shanks, MD   BP 107/73 mmHg  Pulse 68  Temp(Src) 98 F (36.7 C) (Oral)  Resp 14  Ht 5\' 4"  (1.626 m)  Wt 310 lb (140.615 kg)  BMI 53.19 kg/m2  SpO2 100%  LMP 09/16/2012 Physical Exam  Constitutional: She is oriented to person, place, and time. She appears well-developed and well-nourished.  The patient is obese. She is alert and nontoxic. Mental status is clear no respiratory distress.  HENT:  Head: Normocephalic and atraumatic.  Nose: Nose normal.  Mouth/Throat: Oropharynx is clear and moist.  Bilateral TMs normal.  Eyes: EOM are normal. Pupils are equal, round, and reactive to light.  The patient's right eye is slightly proptotic. She reports it is always been this way. The extraocular motions are intact. The pupils are symmetric and responsive.  Neck: Neck supple.  Cardiovascular: Normal rate, regular rhythm, normal heart sounds and intact distal pulses.   Pulmonary/Chest: Effort normal and breath sounds normal.  Abdominal: Soft. Bowel sounds are normal. She exhibits no distension. There is no tenderness.  Musculoskeletal: Normal range of motion. She exhibits no edema.  Neurological: She is alert and oriented to person, place, and time. She has normal strength. No cranial nerve deficit. She exhibits normal muscle tone. Coordination normal. GCS eye subscore is 4. GCS verbal subscore is 5. GCS motor subscore is 6.  Skin: Skin is warm, dry and intact.  Psychiatric: She has a normal mood and affect.    ED Course  Procedures (including critical care time) Labs Review Labs Reviewed  BASIC METABOLIC PANEL - Abnormal; Notable for the following:    Creatinine, Ser 1.11 (*)    GFR calc non Af Amer 62 (*)    GFR calc Af Amer 72 (*)    Anion gap 4 (*)    All other components within normal limits  CBC WITH DIFFERENTIAL - Abnormal; Notable for the following:    WBC 10.9 (*)    RBC 3.85 (*)    Hemoglobin 11.0 (*)    HCT 33.5 (*)    All other  components within normal limits  PREGNANCY, URINE  URINALYSIS, ROUTINE W REFLEX MICROSCOPIC    Imaging Review Ct Head Wo Contrast  11/06/2014   CLINICAL DATA:  Migraine  EXAM: CT HEAD WITHOUT CONTRAST  TECHNIQUE: Contiguous axial images were obtained from the base of the skull through the vertex without intravenous contrast.  COMPARISON:  06/23/2010  FINDINGS: Skull and Sinuses:No acute fracture or destructive process. There is minimal opacification of air cells in the right and left petrous apex, without trabecular erosion or expansion. This is likely arrested aeration. No sinus fluid levels.  Orbits: Chronic enlargement of the right globe, status post cataract resection. The left orbit is unremarkable.  Brain: No evidence of acute infarction, hemorrhage, hydrocephalus, or mass lesion/mass  effect.  IMPRESSION: Normal intracranial imaging.   Electronically Signed   By: Jorje Guild M.D.   On: 11/06/2014 00:05     EKG Interpretation None      MDM   Final diagnoses:  Acute nonintractable headache, unspecified headache type   At this point her headache appears to be of a nonemergent quality. It is generalized and gradual in onset over the past week. No associated fevers or neck stiffness. The patient does not have hypertension. At this point she'll be treated for pain and advised follow-up with her family physician.     Charlesetta Shanks, MD 11/06/14 [id number]

## 2014-11-06 MED ORDER — KETOROLAC TROMETHAMINE 30 MG/ML IJ SOLN
30.0000 mg | Freq: Once | INTRAMUSCULAR | Status: AC
  Administered 2014-11-06: 30 mg via INTRAVENOUS
  Filled 2014-11-06: qty 1

## 2014-11-06 MED ORDER — HYDROMORPHONE HCL 1 MG/ML IJ SOLN
1.0000 mg | Freq: Once | INTRAMUSCULAR | Status: AC
  Administered 2014-11-06: 1 mg via INTRAVENOUS
  Filled 2014-11-06: qty 1

## 2014-11-06 MED ORDER — TRAMADOL HCL 50 MG PO TABS
100.0000 mg | ORAL_TABLET | Freq: Four times a day (QID) | ORAL | Status: DC | PRN
Start: 2014-11-06 — End: 2016-01-17

## 2014-11-06 MED ORDER — ONDANSETRON 4 MG PO TBDP: 4.0000 mg | ORAL_TABLET | ORAL | Status: DC | PRN

## 2014-11-06 MED ORDER — ONDANSETRON HCL 4 MG/2ML IJ SOLN
4.0000 mg | Freq: Once | INTRAMUSCULAR | Status: AC
  Administered 2014-11-06: 4 mg via INTRAVENOUS
  Filled 2014-11-06: qty 2

## 2014-11-06 MED ORDER — IBUPROFEN 600 MG PO TABS: 600.0000 mg | ORAL_TABLET | Freq: Four times a day (QID) | ORAL | Status: DC | PRN

## 2014-11-06 NOTE — Discharge Instructions (Signed)

## 2014-11-06 NOTE — ED Notes (Signed)
Pt a/o x 4 with steady gait. Pt refused wheelchair.

## 2014-12-24 ENCOUNTER — Ambulatory Visit: Payer: 59 | Admitting: *Deleted

## 2016-01-17 ENCOUNTER — Emergency Department (HOSPITAL_COMMUNITY): Payer: 59

## 2016-01-17 ENCOUNTER — Encounter (HOSPITAL_COMMUNITY): Payer: Self-pay | Admitting: *Deleted

## 2016-01-17 ENCOUNTER — Emergency Department (HOSPITAL_COMMUNITY)
Admission: EM | Admit: 2016-01-17 | Discharge: 2016-01-18 | Disposition: A | Discharge status: Home / Self Care | Payer: 59 | Attending: Emergency Medicine | Admitting: Emergency Medicine

## 2016-01-17 DIAGNOSIS — R05 Cough: Secondary | ICD-10-CM | POA: Diagnosis present

## 2016-01-17 DIAGNOSIS — H9209 Otalgia, unspecified ear: Secondary | ICD-10-CM | POA: Diagnosis not present

## 2016-01-17 DIAGNOSIS — H748X3 Other specified disorders of middle ear and mastoid, bilateral: Secondary | ICD-10-CM | POA: Insufficient documentation

## 2016-01-17 DIAGNOSIS — F17210 Nicotine dependence, cigarettes, uncomplicated: Secondary | ICD-10-CM | POA: Diagnosis not present

## 2016-01-17 DIAGNOSIS — J069 Acute upper respiratory infection, unspecified: Secondary | ICD-10-CM | POA: Diagnosis not present

## 2016-01-17 DIAGNOSIS — J45909 Unspecified asthma, uncomplicated: Secondary | ICD-10-CM | POA: Diagnosis not present

## 2016-01-17 DIAGNOSIS — R112 Nausea with vomiting, unspecified: Secondary | ICD-10-CM | POA: Insufficient documentation

## 2016-01-17 DIAGNOSIS — Z79899 Other long term (current) drug therapy: Secondary | ICD-10-CM | POA: Insufficient documentation

## 2016-01-17 DIAGNOSIS — E669 Obesity, unspecified: Secondary | ICD-10-CM | POA: Diagnosis not present

## 2016-01-17 DIAGNOSIS — R197 Diarrhea, unspecified: Secondary | ICD-10-CM | POA: Insufficient documentation

## 2016-01-17 MED ORDER — OXYMETAZOLINE HCL 0.05 % NA SOLN
1.0000 | Freq: Once | NASAL | Status: AC
  Administered 2016-01-18: 1 via NASAL
  Filled 2016-01-17: qty 15

## 2016-01-17 MED ORDER — ONDANSETRON 4 MG PO TBDP
4.0000 mg | ORAL_TABLET | Freq: Once | ORAL | Status: AC
  Administered 2016-01-17: 4 mg via ORAL

## 2016-01-17 MED ORDER — ONDANSETRON 4 MG PO TBDP
ORAL_TABLET | ORAL | Status: AC
  Administered 2016-01-17: 4 mg via ORAL
  Filled 2016-01-17: qty 1

## 2016-01-17 NOTE — ED Provider Notes (Addendum)
CSN: [id number]     Arrival date & time 01/17/16  1931 History   First MD Initiated Contact with Patient 01/17/16 2250     Chief Complaint  Patient presents with  . multiples symptoms      (Consider location/radiation/quality/duration/timing/severity/associated sxs/prior Treatment) HPI Comments: Patient is a 40 year old female with a history of asthma presenting today with 2 days of URI symptoms including cough, congestion, ear pain and fullness, sore throat which is worsening. Today she woke up and she's had multiple episodes of vomiting which she states she basically just vomits up mucus. She has also had between 6 and 8 episodes of diarrhea today that is nonbloody. She denies any recent antibiotic use and states she has traveled around the [place] but friends have not been more than 1-2 hours at a time and no risk factors for PE. Patient states she'll occasionally wheeze and feel short of breath which is relieved with her inhaler that she uses for her asthma. She also mentions that starting yesterday she had some dysuria and frequency. She denies any vaginal discharge.  The history is provided by the patient.    Past Medical History  Diagnosis Date  . Asthma   . Legally blind   . Headache(784.0)   . Obesity    Past Surgical History  Procedure Laterality Date  . Tubal ligation    . Leg surgery    . Cesarean section    . Eye surgery    . Robotic assisted total hysterectomy  09/21/2012    Procedure: ROBOTIC ASSISTED TOTAL HYSTERECTOMY;  Surgeon: Maeola Sarah. Landry Mellow, MD;  Location: [facility];  Service: Gynecology;  Laterality: N/A;  . Cystoscopy  09/21/2012    Procedure: CYSTOSCOPY;  Surgeon: Maeola Sarah. Landry Mellow, MD;  Location: [facility];  Service: Gynecology;  Laterality: N/A;  with cystotomy repair  . Robotic assisted laparoscopic lysis of adhesion  09/21/2012    Procedure: ROBOTIC ASSISTED LAPAROSCOPIC LYSIS OF ADHESION;  Surgeon: Maeola Sarah. Landry Mellow, MD;  Location: [facility];  Service: Gynecology;   Laterality: N/A;  . Abdominal hysterectomy      partial   No family history on file. Social History  Substance Use Topics  . Smoking status: Current Every Day Smoker  . Smokeless tobacco: None  . Alcohol Use: Yes   OB History    No data available     Review of Systems  HENT: Positive for congestion, ear pain and sore throat.   Respiratory: Positive for cough. Negative for shortness of breath.   Gastrointestinal: Positive for nausea, vomiting and diarrhea.  Genitourinary: Positive for dysuria and frequency.  All other systems reviewed and are negative.     Allergies  Review of patient's allergies indicates no known allergies.  Home Medications   Prior to Admission medications   Medication Sig Start Date End Date Taking? Authorizing Provider  acetaminophen (TYLENOL) 500 MG tablet Take 1,000 mg by mouth every 6 (six) hours as needed for mild pain.     Historical Provider, MD  albuterol (PROVENTIL HFA;VENTOLIN HFA) 108 (90 BASE) MCG/ACT inhaler Inhale 2 puffs into the lungs every 6 (six) hours as needed. For shortness of breath    Historical Provider, MD  ciprofloxacin (CIPRO) 500 MG tablet Take 1 tablet (500 mg total) by mouth 2 (two) times daily. Patient not taking: Reported on 11/05/2014 04/14/14   Montine Circle, PA-C  fluconazole (DIFLUCAN) 150 MG tablet Take 1 tablet (150 mg total) by mouth once. Take once finished with antibiotics Patient not  taking: Reported on 11/05/2014 04/14/14   Montine Circle, PA-C  ibuprofen (ADVIL,MOTRIN) 200 MG tablet Take 800 mg by mouth every 6 (six) hours as needed for pain.     Historical Provider, MD  ibuprofen (ADVIL,MOTRIN) 600 MG tablet Take 1 tablet (600 mg total) by mouth every 6 (six) hours as needed. 11/06/14   Charlesetta Shanks, MD  metroNIDAZOLE (FLAGYL) 500 MG tablet Take 1 tablet (500 mg total) by mouth 2 (two) times daily. Patient not taking: Reported on 11/05/2014 04/14/14   Montine Circle, PA-C  ondansetron (ZOFRAN ODT) 4 MG  disintegrating tablet Take 1 tablet (4 mg total) by mouth every 4 (four) hours as needed for nausea or vomiting. 11/06/14   Charlesetta Shanks, MD  traMADol (ULTRAM) 50 MG tablet Take 2 tablets (100 mg total) by mouth every 6 (six) hours as needed. 11/06/14   Charlesetta Shanks, MD   BP 110/69 mmHg  Pulse 74  Temp(Src) 98.6 F (37 C) (Oral)  Resp 16  Ht 5\' 3"  (1.6 m)  Wt 323 lb 5 oz (146.654 kg)  BMI 57.29 kg/m2  SpO2 98%  LMP 09/16/2012 Physical Exam  Constitutional: She is oriented to person, place, and time. She appears well-developed and well-nourished. No distress.  HENT:  Head: Normocephalic and atraumatic.  Right Ear: Tympanic membrane is not injected and not erythematous. A middle ear effusion is present.  Left Ear: Tympanic membrane is not injected and not erythematous. A middle ear effusion is present.  Nose: Mucosal edema present.  Mouth/Throat: Posterior oropharyngeal erythema present.  Eyes: Conjunctivae and EOM are normal. Pupils are equal, round, and reactive to light.  Neck: Normal range of motion. Neck supple.  Cardiovascular: Normal rate, regular rhythm and intact distal pulses.   No murmur heard. Pulmonary/Chest: Effort normal and breath sounds normal. No respiratory distress. She has no wheezes. She has no rales.  Abdominal: Soft. She exhibits no distension. There is no tenderness. There is no rebound and no guarding.  Musculoskeletal: Normal range of motion. She exhibits no edema or tenderness.  Neurological: She is alert and oriented to person, place, and time.  Skin: Skin is warm and dry. No rash noted. No erythema.  Psychiatric: She has a normal mood and affect. Her behavior is normal.  Nursing note and vitals reviewed.   ED Course  Procedures (including critical care time) Labs Review Labs Reviewed  URINALYSIS, ROUTINE W REFLEX MICROSCOPIC (NOT AT [facility]) - Abnormal; Notable for the following:    APPearance CLOUDY (*)    Specific Gravity, Urine 1.034 (*)    Hgb  urine dipstick TRACE (*)    Leukocytes, UA SMALL (*)    All other components within normal limits  URINE MICROSCOPIC-ADD ON - Abnormal; Notable for the following:    Squamous Epithelial / LPF 6-30 (*)    Bacteria, UA RARE (*)    Crystals CA OXALATE CRYSTALS (*)    All other components within normal limits  RAPID STREP SCREEN (NOT AT [facility])  CULTURE, GROUP A STREP [facility])    Imaging Review Dg Chest 2 View  01/17/2016  CLINICAL DATA:  Headache, cold, cough, nasal/ head/chest congestion, nausea, post positive vomiting, intermittent fever, chills, diaphoresis, sore throat, history asthma EXAM: CHEST  2 VIEW COMPARISON:  06/08/2007 FINDINGS: Minimal enlargement of cardiac silhouette. Mediastinal contours and pulmonary vascularity normal. Minimal chronic bronchitic changes without infiltrate, pleural effusion or pneumothorax. No acute osseous findings. IMPRESSION: Minimal chronic bronchitic changes without acute infiltrate. Electronically Signed   By: Lavonia Dana  M.D.   On: 01/17/2016 23:27   I have personally reviewed and evaluated these images and lab results as part of my medical decision-making.   EKG Interpretation None      MDM   Final diagnoses:  URI (upper respiratory infection)    Pt with symptoms consistent with viral URI with nasal congestion, ear pain, cough, vomiting and diarrhea that started over the last 2 days.  CXR wnl and no wheezing on exam.  Low suspicion for PE or cardiac pathology.  Well appearing here.  No signs of breathing difficulty.  Pt does have asthma but using inhaler at home with relief.    Rapid strep pending.  No signs of otitis or abnormal abdominal findings.  Pt also c/o of urinary frequency and dysuria starting yesterday and UA pending.  Pt received zofran in the waiting room without significant improvement.  Pt given po phenergan.  12:36 AM Rapid strep neg.  UA without convincing signs of UTI and urine cultured.  Will given steroids and  phenergan.    Blanchie Dessert, MD 01/18/16 [id number]  Blanchie Dessert, MD 01/18/16 [id number]

## 2016-01-17 NOTE — ED Notes (Signed)
The pt is c/o cold cough  Headache nasal and head and chest congestion with nausea .  She vomits after she coughs  Intermittent temp.  Chills sweating.  feelikng of hot and cold  She has tgraveled over 3-4 states for the past 3 weeks    Sore throat.  lmp none

## 2016-01-17 NOTE — ED Notes (Signed)
Patient transported to XRAY 

## 2016-01-18 LAB — URINALYSIS, ROUTINE W REFLEX MICROSCOPIC
Bilirubin Urine: NEGATIVE
Glucose, UA: NEGATIVE mg/dL
Ketones, ur: NEGATIVE mg/dL
NITRITE: NEGATIVE
PH: 5.5 (ref 5.0–8.0)
Protein, ur: NEGATIVE mg/dL
SPECIFIC GRAVITY, URINE: 1.034 — AB (ref 1.005–1.030)

## 2016-01-18 LAB — URINE MICROSCOPIC-ADD ON

## 2016-01-18 LAB — RAPID STREP SCREEN (MED CTR MEBANE ONLY): Streptococcus, Group A Screen (Direct): NEGATIVE

## 2016-01-18 MED ORDER — PREDNISONE 20 MG PO TABS: 40.0000 mg | ORAL_TABLET | Freq: Every day | ORAL | Status: DC

## 2016-01-18 MED ORDER — PROMETHAZINE HCL 25 MG PO TABS: 25.0000 mg | ORAL_TABLET | Freq: Four times a day (QID) | ORAL | Status: DC | PRN

## 2016-01-18 MED ORDER — PREDNISONE 20 MG PO TABS
60.0000 mg | ORAL_TABLET | Freq: Once | ORAL | Status: AC
  Administered 2016-01-18: 60 mg via ORAL
  Filled 2016-01-18: qty 3

## 2016-01-18 NOTE — Discharge Instructions (Signed)
Upper Respiratory Infection, Adult Most upper respiratory infections (URIs) are a viral infection of the air passages leading to the lungs. A URI affects the nose, throat, and upper air passages. The most common type of URI is nasopharyngitis and is typically referred to as "the common cold." URIs run their course and usually go away on their own. Most of the time, a URI does not require medical attention, but sometimes a bacterial infection in the upper airways can follow a viral infection. This is called a secondary infection. Sinus and middle ear infections are common types of secondary upper respiratory infections. Bacterial pneumonia can also complicate a URI. A URI can worsen asthma and chronic obstructive pulmonary disease (COPD). Sometimes, these complications can require emergency medical care and may be life threatening.  CAUSES Almost all URIs are caused by viruses. A virus is a type of germ and can spread from one person to another.  RISKS FACTORS You may be at risk for a URI if:   You smoke.   You have chronic heart or lung disease.  You have a weakened defense (immune) system.   You are very young or very old.   You have nasal allergies or asthma.  You work in crowded or poorly ventilated areas.  You work in health care facilities or schools. SIGNS AND SYMPTOMS  Symptoms typically develop 2-3 days after you come in contact with a cold virus. Most viral URIs last 7-10 days. However, viral URIs from the influenza virus (flu virus) can last 14-18 days and are typically more severe. Symptoms may include:   Runny or stuffy (congested) nose.   Sneezing.   Cough.   Sore throat.   Headache.   Fatigue.   Fever.   Loss of appetite.   Pain in your forehead, behind your eyes, and over your cheekbones (sinus pain).  Muscle aches.  DIAGNOSIS  Your health care provider may diagnose a URI by:  Physical exam.  Tests to check that your symptoms are not due to  another condition such as:  Strep throat.  Sinusitis.  Pneumonia.  Asthma. TREATMENT  A URI goes away on its own with time. It cannot be cured with medicines, but medicines may be prescribed or recommended to relieve symptoms. Medicines may help:  Reduce your fever.  Reduce your cough.  Relieve nasal congestion. HOME CARE INSTRUCTIONS   Take medicines only as directed by your health care provider.   Gargle warm saltwater or take cough drops to comfort your throat as directed by your health care provider.  Use a warm mist humidifier or inhale steam from a shower to increase air moisture. This may make it easier to breathe.  Drink enough fluid to keep your urine clear or pale yellow.   Eat soups and other clear broths and maintain good nutrition.   Rest as needed.   Return to work when your temperature has returned to normal or as your health care provider advises. You may need to stay home longer to avoid infecting others. You can also use a face mask and careful hand washing to prevent spread of the virus.  Increase the usage of your inhaler if you have asthma.   Do not use any tobacco products, including cigarettes, chewing tobacco, or electronic cigarettes. If you need help quitting, ask your health care provider. PREVENTION  The best way to protect yourself from getting a cold is to practice good hygiene.   Avoid oral or hand contact with people with cold   symptoms.   Wash your hands often if contact occurs.  There is no clear evidence that vitamin C, vitamin E, echinacea, or exercise reduces the chance of developing a cold. However, it is always recommended to get plenty of rest, exercise, and practice good nutrition.  SEEK MEDICAL CARE IF:   You are getting worse rather than better.   Your symptoms are not controlled by medicine.   You have chills.  You have worsening shortness of breath.  You have brown or red mucus.  You have yellow or brown nasal  discharge.  You have pain in your face, especially when you bend forward.  You have a fever.  You have swollen neck glands.  You have pain while swallowing.  You have white areas in the back of your throat. SEEK IMMEDIATE MEDICAL CARE IF:   You have severe or persistent:  Headache.  Ear pain.  Sinus pain.  Chest pain.  You have chronic lung disease and any of the following:  Wheezing.  Prolonged cough.  Coughing up blood.  A change in your usual mucus.  You have a stiff neck.  You have changes in your:  Vision.  Hearing.  Thinking.  Mood. MAKE SURE YOU:   Understand these instructions.  Will watch your condition.  Will get help right away if you are not doing well or get worse.   This information is not intended to replace advice given to you by your health care provider. Make sure you discuss any questions you have with your health care provider.   Document Released: 03/31/2001 Document Revised: 02/19/2015 Document Reviewed: 01/10/2014 Elsevier Interactive Patient Education 2016 [employer]  

## 2016-01-20 LAB — CULTURE, GROUP A STREP (THRC)

## 2017-07-06 ENCOUNTER — Encounter (HOSPITAL_COMMUNITY): Payer: Self-pay

## 2017-07-06 DIAGNOSIS — R51 Headache: Secondary | ICD-10-CM | POA: Insufficient documentation

## 2017-07-06 DIAGNOSIS — Z5321 Procedure and treatment not carried out due to patient leaving prior to being seen by health care provider: Secondary | ICD-10-CM | POA: Diagnosis not present

## 2017-07-06 MED ORDER — ONDANSETRON 4 MG PO TBDP
ORAL_TABLET | ORAL | Status: AC
  Filled 2017-07-06: qty 1

## 2017-07-06 MED ORDER — ONDANSETRON 4 MG PO TBDP
4.0000 mg | ORAL_TABLET | Freq: Once | ORAL | Status: AC
  Administered 2017-07-06: 4 mg via ORAL

## 2017-07-06 NOTE — ED Triage Notes (Signed)
Pt states that she has had a migraine for the past three days, worse today, causing her pain all over, vomiting off and on all days. Denies a hx of migraines. Sensitivity to lights and noise.

## 2017-07-06 NOTE — ED Triage Notes (Signed)
Pt vomiting in triage 

## 2017-07-07 ENCOUNTER — Emergency Department (HOSPITAL_COMMUNITY)
Admission: EM | Admit: 2017-07-07 | Discharge: 2017-07-07 | Disposition: A | Discharge status: Home / Self Care | Payer: 59 | Attending: Emergency Medicine | Admitting: Emergency Medicine

## 2017-07-07 NOTE — ED Notes (Signed)
Pt called for room no answer

## 2018-01-24 ENCOUNTER — Encounter (HOSPITAL_COMMUNITY): Payer: Self-pay | Admitting: Neurology

## 2018-01-24 ENCOUNTER — Other Ambulatory Visit: Payer: Self-pay

## 2018-01-24 ENCOUNTER — Emergency Department (HOSPITAL_COMMUNITY)
Admission: EM | Admit: 2018-01-24 | Discharge: 2018-01-24 | Disposition: A | Discharge status: Home / Self Care | Payer: 59 | Attending: Emergency Medicine | Admitting: Emergency Medicine

## 2018-01-24 DIAGNOSIS — F17210 Nicotine dependence, cigarettes, uncomplicated: Secondary | ICD-10-CM | POA: Diagnosis not present

## 2018-01-24 DIAGNOSIS — M5441 Lumbago with sciatica, right side: Secondary | ICD-10-CM | POA: Diagnosis present

## 2018-01-24 DIAGNOSIS — J45909 Unspecified asthma, uncomplicated: Secondary | ICD-10-CM | POA: Diagnosis not present

## 2018-01-24 DIAGNOSIS — Z79899 Other long term (current) drug therapy: Secondary | ICD-10-CM | POA: Diagnosis not present

## 2018-01-24 MED ORDER — PREDNISONE 10 MG PO TABS: 40.0000 mg | ORAL_TABLET | Freq: Every day | ORAL | 0 refills | Status: AC

## 2018-01-24 MED ORDER — HYDROMORPHONE HCL 1 MG/ML IJ SOLN
1.0000 mg | Freq: Once | INTRAMUSCULAR | Status: AC
  Administered 2018-01-24: 1 mg via INTRAMUSCULAR
  Filled 2018-01-24: qty 1

## 2018-01-24 MED ORDER — ACETAMINOPHEN 500 MG PO TABS
1000.0000 mg | ORAL_TABLET | Freq: Once | ORAL | Status: AC
  Administered 2018-01-24: 1000 mg via ORAL
  Filled 2018-01-24: qty 2

## 2018-01-24 MED ORDER — METHOCARBAMOL 500 MG PO TABS: 500.0000 mg | ORAL_TABLET | Freq: Three times a day (TID) | ORAL | 0 refills | Status: DC | PRN

## 2018-01-24 MED ORDER — OXYCODONE HCL 5 MG PO TABA: 5.0000 mg | ORAL_TABLET | ORAL | 0 refills | Status: AC | PRN

## 2018-01-24 NOTE — Discharge Instructions (Addendum)
Given your symptoms and physical exam findings, I suspect you have sciatic nerve inflammation.   We will treat your inflammation with the following medication regimen: Prednisone 40 mg daily x 5 days Robaxin 500 mg every 8 hours x 5 days Ibuprofen 600 mg + acetaminophen 1000 mg every 8 hours for the next 5 days Oxycodone 5 mg every 4-6 hours  Heating pad as needed Over the counter lidocaine patches (salonpas) can be helpful  Avoid any exacerbating activities for the next 48 hours.  After 48 hours, start doing light back range of motion exercises and walking to avoid worsening back stiffness.   Return for fevers, chills, abdominal pain, changes in bowel movement, urinary symptoms, groin numbness, loss of bladder or bowel control, numbness weakness or heaviness to your extremities, rash.

## 2018-01-24 NOTE — ED Triage Notes (Addendum)
Pt reports several weeks pain to both heels, ankles, lower legs like a cramp. Yesterday she works on [employer] and stood for 8 hours. She denies any injuries. She does report swelling to ankles after she works. The biggest concern is burning sensation to right ankle, heel.

## 2018-01-24 NOTE — ED Provider Notes (Addendum)
[facility] EMERGENCY DEPARTMENT Provider Note   CSN: [id number] Arrival date & time: 01/24/18  1110     History   Chief Complaint Chief Complaint  Patient presents with  . Ankle Pain    HPI Madison Welch is a 42 y.o. female with history of obesity is here for evaluation of right-sided lower extremity pain  Since finishing work yesterday. Describes the pain as a sharp, burning, tingling sensation that starts at the bottom of her right foot and spreads up into the posterior calf, thigh, buttock. Pain is 10/10. Aggravating factors include putting weight on her right leg, bending, standing, walking, prolonged sitting. Has tried over-the-counter medications with little relief. Denies any recent direct trauma or falls.She has no previous history of back injuries or surgeries. No history of diabetes. No saddle anesthesia, loss of bladder or bowel control, numbness and heaviness or weakness in her extremities.  No asymmetric calf swelling, h/o DVT/PE. Her daily job requires standing for 8 hours a day. Has been having intermittent bilateral heel and foot pain for several days, this pain is worse afterwork.  HPI  Past Medical History:  Diagnosis Date  . Asthma   . Headache(784.0)   . Legally blind   . Obesity     Patient Active Problem List   Diagnosis Date Noted  . Menorrhagia 09/22/2012  . Dysmenorrhea 09/22/2012    Past Surgical History:  Procedure Laterality Date  . ABDOMINAL HYSTERECTOMY     partial  . CESAREAN SECTION    . CYSTOSCOPY  09/21/2012   Procedure: CYSTOSCOPY;  Surgeon: Maeola Sarah. Landry Mellow, MD;  Location: [facility];  Service: Gynecology;  Laterality: N/A;  with cystotomy repair  . EYE SURGERY    . LEG SURGERY    . ROBOTIC ASSISTED LAPAROSCOPIC LYSIS OF ADHESION  09/21/2012   Procedure: ROBOTIC ASSISTED LAPAROSCOPIC LYSIS OF ADHESION;  Surgeon: Maeola Sarah. Landry Mellow, MD;  Location: [facility];  Service: Gynecology;  Laterality: N/A;  . ROBOTIC ASSISTED TOTAL HYSTERECTOMY   09/21/2012   Procedure: ROBOTIC ASSISTED TOTAL HYSTERECTOMY;  Surgeon: Maeola Sarah. Landry Mellow, MD;  Location: [facility];  Service: Gynecology;  Laterality: N/A;  . TUBAL LIGATION       OB History   None      Home Medications    Prior to Admission medications   Medication Sig Start Date End Date Taking? Authorizing Provider  acetaminophen (TYLENOL) 500 MG tablet Take 1,000 mg by mouth every 6 (six) hours as needed for mild pain.     [provider]  albuterol (PROVENTIL HFA;VENTOLIN HFA) 108 (90 BASE) MCG/ACT inhaler Inhale 2 puffs into the lungs every 6 (six) hours as needed. For shortness of breath    [provider]  ibuprofen (ADVIL,MOTRIN) 200 MG tablet Take 800 mg by mouth every 6 (six) hours as needed for pain.     [provider]  methocarbamol (ROBAXIN) 500 MG tablet Take 1 tablet (500 mg total) by mouth every 8 (eight) hours as needed for muscle spasms. 01/24/18   Kinnie Feil, PA-C  OxyCODONE HCl, Abuse Deter, (OXAYDO) 5 MG TABA Take 5 mg by mouth every 4 (four) hours as needed for up to 2 days. 01/24/18 01/26/18  Kinnie Feil, PA-C  predniSONE (DELTASONE) 10 MG tablet Take 4 tablets (40 mg total) by mouth daily for 5 days. 01/24/18 01/29/18  Kinnie Feil, PA-C  promethazine (PHENERGAN) 25 MG tablet Take 1 tablet (25 mg total) by mouth every 6 (six) hours as needed  for nausea or vomiting. 01/18/16   Blanchie Dessert, MD    Family History No family history on file.  Social History Social History   Tobacco Use  . Smoking status: Current Every Day Smoker  . Smokeless tobacco: Never Used  Substance Use Topics  . Alcohol use: Yes  . Drug use: No     Allergies   Patient has no known allergies.   Review of Systems Review of Systems  Musculoskeletal: Positive for arthralgias, back pain, gait problem and myalgias.  Neurological:       Paresthesias   All other systems reviewed and are negative.    Physical Exam Updated Vital Signs BP 119/75  (BP Location: Right Arm)   Pulse 67   Temp 98.1 F (36.7 C) (Oral)   Resp (!) 21   Ht 5\' 4"  (1.626 m)   Wt (!) 138.3 kg (305 lb)   LMP 09/16/2012   SpO2 100%   BMI 52.35 kg/m   Physical Exam  Constitutional: She is oriented to person, place, and time. She appears well-developed and well-nourished. No distress.  HENT:  Head: Normocephalic and atraumatic.  Nose: Nose normal.  Eyes: EOM are normal.  Cardiovascular: Normal rate, S1 normal, S2 normal and normal heart sounds.  Pulses:      Radial pulses are 2+ on the right side, and 2+ on the left side.       Dorsalis pedis pulses are 2+ on the right side, and 2+ on the left side.  2+ DP and PT pulses bilaterally. No asymmetric LE edema or calf tenderness.  Pulmonary/Chest: Effort normal and breath sounds normal. She has no decreased breath sounds.  Abdominal: Soft. Normal appearance and bowel sounds are normal. There is no tenderness.  No suprapubic or CVA tenderness   Musculoskeletal: She exhibits tenderness.       Lumbar back: She exhibits tenderness and pain.  CT spine: no midline spinous process tenderness.  L spine: no midline spinous process tenderness. Tenderness to R paraspinal musculature, R SI and sciatic notch. Positive ipsilateral SLR.  Tearful during exam.  Ambulatory with mild antalgic gait favoring right side.  Pelvis: AP/L compression without pain or obvious instability, no leg rotation or shortening. Full PROM of hips without pain or crepitus.   Neurological: She is alert and oriented to person, place, and time.  5/5 strength with flexion/extension of hip, knee and ankle, bilaterally.  Sensation to light touch intact in lower extremities including feet  Skin: Skin is warm and dry. Capillary refill takes less than 2 seconds.  Psychiatric: She has a normal mood and affect. Her speech is normal and behavior is normal. Judgment and thought content normal. Cognition and memory are normal.  Nursing note and vitals  reviewed.    ED Treatments / Results  Labs (all labs ordered are listed, but only abnormal results are displayed) Labs Reviewed - No data to display  EKG None  Radiology No results found.  Procedures Procedures (including critical care time)  Medications Ordered in ED Medications  HYDROmorphone (DILAUDID) injection 1 mg (1 mg Intramuscular Given 01/24/18 1424)  acetaminophen (TYLENOL) tablet 1,000 mg (1,000 mg Oral Given 01/24/18 1424)     Initial Impression / Assessment and Plan / ED Course  I have reviewed the triage vital signs and the nursing notes.  Pertinent labs & imaging results that were available during my care of the patient were reviewed by me and considered in my medical decision making (see chart for details).  With reproducible R SI joint, sciatic notch and posterior leg pain to plantar aspect of her right foot with +SLR. Most consistent with sciatica plus or minus associated spasms. Less likely compression fx, pyelonephritis, nephrolithiasis, epidural abscess, AAA, dissection, cauda equina, DVT.  She has no urinary symptoms, h/o kidney stones, abdominal pain. No red flag symptoms of back pain including: bladder/bowel incontinence or retention, fevers, unexplained weight loss, h/o cancer, IVDU, trauma, focal neuro deficits, UTI symptoms or CVAT, abdominal pain, midline spinous process tenderness.    Will dc with tx for sciatica.  She verbalized understanding and states she will f/u with PCP for MRI. ED return precautions discussed with pt who verbalized understanding and was agreeable with plan.  Final Clinical Impressions(s) / ED Diagnoses   Final diagnoses:  Acute back pain with sciatica, right    ED Discharge Orders        Ordered    predniSONE (DELTASONE) 10 MG tablet  Daily     01/24/18 1434    methocarbamol (ROBAXIN) 500 MG tablet  Every 8 hours PRN     01/24/18 1434    OxyCODONE HCl, Abuse Deter, (OXAYDO) 5 MG TABA  Every 4 hours PRN     01/24/18  1434         Kinnie Feil, PA-C 01/24/18 [place], [place], DO 01/25/18 [phone]

## 2018-02-01 ENCOUNTER — Other Ambulatory Visit: Payer: Self-pay | Admitting: Internal Medicine

## 2018-02-01 ENCOUNTER — Ambulatory Visit
Admission: RE | Admit: 2018-02-01 | Discharge: 2018-02-01 | Disposition: A | Discharge status: Home / Self Care | Payer: 59 | Source: Ambulatory Visit | Attending: Internal Medicine | Admitting: Internal Medicine

## 2018-02-01 DIAGNOSIS — M545 Low back pain, unspecified: Secondary | ICD-10-CM

## 2018-02-01 DIAGNOSIS — M79604 Pain in right leg: Secondary | ICD-10-CM

## 2018-02-15 ENCOUNTER — Ambulatory Visit: Payer: 59 | Attending: Internal Medicine | Admitting: Physical Therapy

## 2018-02-22 ENCOUNTER — Ambulatory Visit: Payer: 59 | Attending: Internal Medicine | Admitting: Physical Therapy

## 2018-03-02 ENCOUNTER — Ambulatory Visit: Payer: 59 | Admitting: Physical Therapy

## 2019-04-22 ENCOUNTER — Other Ambulatory Visit: Payer: Self-pay

## 2019-04-22 ENCOUNTER — Encounter (HOSPITAL_COMMUNITY): Payer: Self-pay | Admitting: Emergency Medicine

## 2019-04-22 ENCOUNTER — Emergency Department (HOSPITAL_COMMUNITY)
Admission: EM | Admit: 2019-04-22 | Discharge: 2019-04-22 | Disposition: A | Discharge status: Home / Self Care | Payer: 59 | Attending: Emergency Medicine | Admitting: Emergency Medicine

## 2019-04-22 ENCOUNTER — Emergency Department (HOSPITAL_COMMUNITY): Payer: 59

## 2019-04-22 DIAGNOSIS — J45909 Unspecified asthma, uncomplicated: Secondary | ICD-10-CM | POA: Diagnosis not present

## 2019-04-22 DIAGNOSIS — R0789 Other chest pain: Secondary | ICD-10-CM | POA: Diagnosis present

## 2019-04-22 DIAGNOSIS — F17200 Nicotine dependence, unspecified, uncomplicated: Secondary | ICD-10-CM | POA: Diagnosis not present

## 2019-04-22 DIAGNOSIS — Z79899 Other long term (current) drug therapy: Secondary | ICD-10-CM | POA: Insufficient documentation

## 2019-04-22 DIAGNOSIS — Z20822 Contact with and (suspected) exposure to covid-19: Secondary | ICD-10-CM

## 2019-04-22 DIAGNOSIS — Z20828 Contact with and (suspected) exposure to other viral communicable diseases: Secondary | ICD-10-CM | POA: Insufficient documentation

## 2019-04-22 DIAGNOSIS — R0602 Shortness of breath: Secondary | ICD-10-CM | POA: Diagnosis not present

## 2019-04-22 LAB — CBC WITH DIFFERENTIAL/PLATELET
Abs Immature Granulocytes: 0.03 10*3/uL (ref 0.00–0.07)
Basophils Absolute: 0 10*3/uL (ref 0.0–0.1)
Basophils Relative: 0 %
Eosinophils Absolute: 0.2 10*3/uL (ref 0.0–0.5)
Eosinophils Relative: 2 %
HCT: 39.8 % (ref 36.0–46.0)
Hemoglobin: 12.7 g/dL (ref 12.0–15.0)
Immature Granulocytes: 0 %
Lymphocytes Relative: 26 %
Lymphs Abs: 2.7 10*3/uL (ref 0.7–4.0)
MCH: 28.7 pg (ref 26.0–34.0)
MCHC: 31.9 g/dL (ref 30.0–36.0)
MCV: 90 fL (ref 80.0–100.0)
Monocytes Absolute: 0.4 10*3/uL (ref 0.1–1.0)
Monocytes Relative: 4 %
Neutro Abs: 6.9 10*3/uL (ref 1.7–7.7)
Neutrophils Relative %: 68 %
Platelets: 218 10*3/uL (ref 150–400)
RBC: 4.42 MIL/uL (ref 3.87–5.11)
RDW: 14.4 % (ref 11.5–15.5)
WBC: 10.2 10*3/uL (ref 4.0–10.5)
nRBC: 0 % (ref 0.0–0.2)

## 2019-04-22 LAB — COMPREHENSIVE METABOLIC PANEL
ALT: 17 U/L (ref 0–44)
AST: 20 U/L (ref 15–41)
Albumin: 3.2 g/dL — ABNORMAL LOW (ref 3.5–5.0)
Alkaline Phosphatase: 96 U/L (ref 38–126)
Anion gap: 9 (ref 5–15)
BUN: 7 mg/dL (ref 6–20)
CO2: 24 mmol/L (ref 22–32)
Calcium: 8.6 mg/dL — ABNORMAL LOW (ref 8.9–10.3)
Chloride: 106 mmol/L (ref 98–111)
Creatinine, Ser: 0.61 mg/dL (ref 0.44–1.00)
GFR calc Af Amer: >60 mL/min (ref >60)
GFR calc non Af Amer: >60 mL/min (ref >60)
Glucose, Bld: 97 mg/dL (ref 70–99)
Potassium: 3.9 mmol/L (ref 3.5–5.1)
Sodium: 139 mmol/L (ref 135–145)
Total Bilirubin: 0.7 mg/dL (ref 0.3–1.2)
Total Protein: 6.7 g/dL (ref 6.5–8.1)

## 2019-04-22 LAB — TROPONIN I (HIGH SENSITIVITY): Troponin I (High Sensitivity): <2 ng/L (ref <18)

## 2019-04-22 MED ORDER — ACETAMINOPHEN 500 MG PO TABS
1000.0000 mg | ORAL_TABLET | Freq: Once | ORAL | Status: AC
  Administered 2019-04-22: 1000 mg via ORAL
  Filled 2019-04-22: qty 2

## 2019-04-22 MED ORDER — ALBUTEROL SULFATE HFA 108 (90 BASE) MCG/ACT IN AERS
10.0000 | INHALATION_SPRAY | Freq: Once | RESPIRATORY_TRACT | Status: AC
  Administered 2019-04-22: 12:00:00 10 via RESPIRATORY_TRACT
  Filled 2019-04-22: qty 6.7

## 2019-04-22 MED ORDER — AEROCHAMBER PLUS FLO-VU MEDIUM MISC
1.0000 | Freq: Once | Status: AC
  Administered 2019-04-22: 1
  Filled 2019-04-22: qty 1

## 2019-04-22 MED ORDER — IPRATROPIUM BROMIDE HFA 17 MCG/ACT IN AERS
4.0000 | INHALATION_SPRAY | Freq: Once | RESPIRATORY_TRACT | Status: AC
  Administered 2019-04-22: 4 via RESPIRATORY_TRACT
  Filled 2019-04-22: qty 12.9

## 2019-04-22 NOTE — ED Provider Notes (Signed)
[facility] EMERGENCY DEPARTMENT Provider Note   CSN: [id number] Arrival date & time: 04/22/19  1101     History   Chief Complaint Chief Complaint  Patient presents with  . Chest Pain  . Shortness of Breath    HPI Madison Welch is a 43 y.o. female.     HPI  Patient is a 43 year old female with past medical history of asthma, obesity, headaches, and legal blindness of the right eye presenting for throat irritation and shortness of breath.  Patient reports that she works part-time at a nursing home and there have been positive COVID cases at the facility.  She had a recent exposure and was tested 48 hours ago however the results are not back.  She reports that her mother began having difficulty breathing and became febrile over the past 24 hours prompting emergency department visit.  Patient is concerned that she is developing COVID-19.  She reports that she primarily feels shortness of breath in her throat, but denies any significant wheezing or chest pain.  Denies exertional symptoms.  She denies any abdominal pain, nausea or vomiting.  She denies any lower extremity edema calf tenderness or hemoptysis.  Patient denies any history DVT/PE, recent immobilization, hospitalization, recent surgery, hormone use or cancer treatment.  Patient reports she is used her rescue inhaler as needed but it is not improved her symptoms.  Past Medical History:  Diagnosis Date  . Asthma   . Headache(784.0)   . Legally blind   . Obesity     Patient Active Problem List   Diagnosis Date Noted  . Menorrhagia 09/22/2012  . Dysmenorrhea 09/22/2012    Past Surgical History:  Procedure Laterality Date  . ABDOMINAL HYSTERECTOMY     partial  . CESAREAN SECTION    . CYSTOSCOPY  09/21/2012   Procedure: CYSTOSCOPY;  Surgeon: Maeola Sarah. Landry Mellow, MD;  Location: [facility];  Service: Gynecology;  Laterality: N/A;  with cystotomy repair  . EYE SURGERY    . LEG SURGERY    . ROBOTIC ASSISTED  LAPAROSCOPIC LYSIS OF ADHESION  09/21/2012   Procedure: ROBOTIC ASSISTED LAPAROSCOPIC LYSIS OF ADHESION;  Surgeon: Maeola Sarah. Landry Mellow, MD;  Location: [facility];  Service: Gynecology;  Laterality: N/A;  . ROBOTIC ASSISTED TOTAL HYSTERECTOMY  09/21/2012   Procedure: ROBOTIC ASSISTED TOTAL HYSTERECTOMY;  Surgeon: Maeola Sarah. Landry Mellow, MD;  Location: [facility];  Service: Gynecology;  Laterality: N/A;  . TUBAL LIGATION       OB History   No obstetric history on file.      Home Medications    Prior to Admission medications   Medication Sig Start Date End Date Taking? Authorizing Provider  acetaminophen (TYLENOL) 500 MG tablet Take 1,000 mg by mouth every 6 (six) hours as needed for mild pain.     [provider]  albuterol (PROVENTIL HFA;VENTOLIN HFA) 108 (90 BASE) MCG/ACT inhaler Inhale 2 puffs into the lungs every 6 (six) hours as needed. For shortness of breath    [provider]  ibuprofen (ADVIL,MOTRIN) 200 MG tablet Take 800 mg by mouth every 6 (six) hours as needed for pain.     [provider]  methocarbamol (ROBAXIN) 500 MG tablet Take 1 tablet (500 mg total) by mouth every 8 (eight) hours as needed for muscle spasms. 01/24/18   Kinnie Feil, PA-C  promethazine (PHENERGAN) 25 MG tablet Take 1 tablet (25 mg total) by mouth every 6 (six) hours as needed for nausea or vomiting. 01/18/16  Blanchie Dessert, MD    Family History No family history on file.  Social History Social History   Tobacco Use  . Smoking status: Current Every Day Smoker  . Smokeless tobacco: Never Used  Substance Use Topics  . Alcohol use: Yes  . Drug use: No     Allergies   Patient has no known allergies.   Review of Systems Review of Systems  Constitutional: Negative for chills and fever.  HENT: Positive for rhinorrhea, sinus pressure and sore throat. Negative for congestion, sinus pain, trouble swallowing and voice change.   Eyes: Negative for visual disturbance.  Respiratory: Positive  for cough and shortness of breath. Negative for chest tightness.   Cardiovascular: Negative for chest pain, palpitations and leg swelling.  Gastrointestinal: Negative for abdominal pain, nausea and vomiting.  Genitourinary: Negative for dysuria and flank pain.  Musculoskeletal: Negative for back pain and myalgias.  Skin: Negative for rash.  Neurological: Negative for dizziness, syncope, light-headedness and headaches.     Physical Exam Updated Vital Signs BP 116/77 (BP Location: Right Arm)   Pulse 83   Temp 98.4 F (36.9 C) (Oral)   Resp 16   Ht 5\' 4"  (1.626 m)   Wt (!) 145.2 kg   LMP 09/16/2012   SpO2 96%   BMI 54.93 kg/m   Physical Exam Vitals signs and nursing note reviewed.  Constitutional:      General: She is not in acute distress.    Appearance: She is well-developed. She is obese.  HENT:     Head: Normocephalic and atraumatic.     Mouth/Throat:     Comments: Normal phonation. No muffled voice sounds. Patient swallows secretions without difficulty. Dentition normal. No lesions of tongue or buccal mucosa. Uvula midline. No asymmetric swelling of the posterior pharynx.No erythema of posterior pharynx. No tonsillar exuduate. No lingual swelling. No induration inferior to tongue. No submandibular tenderness, swelling, or induration.  Tissues of the neck supple. No cervical lymphadenopathy. Eyes:     Conjunctiva/sclera: Conjunctivae normal.     Pupils: Pupils are equal, round, and reactive to light.  Neck:     Musculoskeletal: Normal range of motion and neck supple.  Cardiovascular:     Rate and Rhythm: Normal rate and regular rhythm.     Heart sounds: S1 normal and S2 normal. No murmur.  Pulmonary:     Effort: Pulmonary effort is normal.     Breath sounds: Normal breath sounds. No wheezing or rales.  Abdominal:     General: There is no distension.     Palpations: Abdomen is soft.     Tenderness: There is no abdominal tenderness. There is no guarding.   Musculoskeletal: Normal range of motion.        General: No deformity.     Right lower leg: She exhibits no tenderness. No edema.     Left lower leg: She exhibits no tenderness. No edema.  Lymphadenopathy:     Cervical: No cervical adenopathy.  Skin:    General: Skin is warm and dry.     Findings: No erythema or rash.  Neurological:     Mental Status: She is alert.     Comments: Cranial nerves grossly intact. Patient moves extremities symmetrically and with good coordination.  Psychiatric:        Behavior: Behavior normal.        Thought Content: Thought content normal.        Judgment: Judgment normal.      ED Treatments /  Results  Labs (all labs ordered are listed, but only abnormal results are displayed) Labs Reviewed  COMPREHENSIVE METABOLIC PANEL - Abnormal; Notable for the following components:      Result Value   Calcium 8.6 (*)    Albumin 3.2 (*)    All other components within normal limits  CBC WITH DIFFERENTIAL/PLATELET  TROPONIN I (HIGH SENSITIVITY)    EKG EKG Interpretation  Date/Time:  Saturday April 22 2019 11:41:24 EDT Ventricular Rate:  87 PR Interval:    QRS Duration: 101 QT Interval:  377 QTC Calculation: 454 R Axis:   45 Text Interpretation:  Sinus rhythm Atrial premature complexes Consider left atrial enlargement Low voltage, precordial leads No significant change since last tracing Confirmed by Deno Etienne [phone]) on 04/22/2019 2:04:02 PM   Radiology Dg Chest Portable 1 View  Result Date: 04/22/2019 CLINICAL DATA:  Chest pain, cough EXAM: PORTABLE CHEST 1 VIEW COMPARISON:  12/30/2015 FINDINGS: The heart size and mediastinal contours are within normal limits. Both lungs are clear. The visualized skeletal structures are unremarkable. IMPRESSION: No active disease. Electronically Signed   By: Jerilynn Mages.  Shick M.D.   On: 04/22/2019 12:22    Procedures Procedures (including critical care time)  Medications Ordered in ED Medications  ipratropium (ATROVENT  HFA) inhaler 4 puff (has no administration in time range)  albuterol (VENTOLIN HFA) 108 (90 Base) MCG/ACT inhaler 10 puff (10 puffs Inhalation Given 04/22/19 1222)  AeroChamber Plus Flo-Vu Medium MISC 1 each (1 each Other Given 04/22/19 1223)     Initial Impression / Assessment and Plan / ED Course  I have reviewed the triage vital signs and the nursing notes.  Pertinent labs & imaging results that were available during my care of the patient were reviewed by me and considered in my medical decision making (see chart for details).  Clinical Course as of Apr 21 1414  Sat Apr 22, 2019  1408 Pt reports she feels much better and feels ready to go home. Return precautions discussed. Patient is in understanding and agrees with plan of care.    [AM]    Clinical Course User Index [AM] Albesa Seen, PA-C       This is a well-appearing 43 year old female with past medical history of asthma, obesity, headaches, blindness in the right eye presenting for shortness of breath.  Vital signs are completely stable without fever, tachycardia, tachypnea or hypoxia.  From her description, it sounds as the shortness of breath is mostly upper airway.  She has a mild cough on exam.  She has had exposure to COVID-19 and is awaiting results. Low suspicion for PE given upper respiratory predominance of symptoms and stable VS, as well as no risk factors for PE.   Patient's work-up is reassuring.  Patient has chest x-ray without infiltrate or cardiopulmonary disease, reviewed by me.  Troponin is negative.  EKG normal sinus rhythm without evidence of acute ischemia, infarction, or arrhythmia.  No leukocytosis.  She has mild hypocalcemia and hypoalbuminemia, but no transaminitis or other laboratory markers of COVID-19.  Patient given HFA equivalent of DuoNeb here in emergency department with significant improvement.  She feels stable for discharge.  She is instructed to continue to self isolate and await her COVID-19  test results.  Return precautions given for any chest pain, worsening shortness of breath, new or worsening symptoms.  Patient is in understanding and agrees with the plan of care.  Final Clinical Impressions(s) / ED Diagnoses   Final diagnoses:  Shortness of breath  Exposure to [place]    ED Discharge Orders    None       Tamala Julian 04/22/19 [place], [place], DO 04/22/19 1419

## 2019-04-22 NOTE — ED Triage Notes (Signed)
Pt coming from home. Pt complaining of mild chest tightness and shortness of breath that she thinks is related to her asthma. Pt did [place] inhaler this morning w/o relief. Pt also states she may have exposed to covid-19 pt at work.

## 2019-04-22 NOTE — Discharge Instructions (Signed)
Please read and follow all provided instructions.  Your diagnoses today include:  1. Shortness of breath   2. Exposure to Covid-19 Virus     Tests performed today include: TESTS Vital signs. See below for your results today.   Medications prescribed:   Take any prescribed medications only as directed.  Home care instructions:  Follow any educational materials contained in this packet.  Follow-up instructions: Please follow-up with your primary care provider in the next 3 days for further evaluation of your symptoms and management of your asthma.  Return instructions:  Please return to the Emergency Department if you experience worsening symptoms. Please return with worsening wheezing, shortness of breath, or difficulty breathing. Return with persistent fever above 101F.  Please return if you have any other emergent concerns.  Additional Information:  Your vital signs today were: BP 117/64    Pulse 89    Temp 98.4 F (36.9 C) (Oral)    Resp 16    Ht 5\' 4"  (1.626 m)    Wt (!) 145.2 kg    LMP 09/16/2012    SpO2 97%    BMI 54.93 kg/m  If your blood pressure (BP) was elevated above 135/85 this visit, please have this repeated by your doctor within one month. --------------

## 2019-04-22 NOTE — ED Notes (Signed)
Patient verbalizes understanding of discharge instructions. Opportunity for questioning and answers were provided. Pt discharged from ED. 

## 2019-07-07 ENCOUNTER — Emergency Department (HOSPITAL_BASED_OUTPATIENT_CLINIC_OR_DEPARTMENT_OTHER): Payer: 59

## 2019-07-07 ENCOUNTER — Emergency Department (HOSPITAL_COMMUNITY): Payer: 59

## 2019-07-07 ENCOUNTER — Other Ambulatory Visit: Payer: Self-pay

## 2019-07-07 ENCOUNTER — Emergency Department (HOSPITAL_COMMUNITY)
Admission: EM | Admit: 2019-07-07 | Discharge: 2019-07-07 | Disposition: A | Discharge status: Home / Self Care | Payer: 59 | Attending: Emergency Medicine | Admitting: Emergency Medicine

## 2019-07-07 DIAGNOSIS — M25512 Pain in left shoulder: Secondary | ICD-10-CM | POA: Insufficient documentation

## 2019-07-07 DIAGNOSIS — M7989 Other specified soft tissue disorders: Secondary | ICD-10-CM

## 2019-07-07 DIAGNOSIS — R202 Paresthesia of skin: Secondary | ICD-10-CM | POA: Diagnosis not present

## 2019-07-07 DIAGNOSIS — R0789 Other chest pain: Secondary | ICD-10-CM | POA: Diagnosis present

## 2019-07-07 DIAGNOSIS — F17210 Nicotine dependence, cigarettes, uncomplicated: Secondary | ICD-10-CM | POA: Insufficient documentation

## 2019-07-07 LAB — BASIC METABOLIC PANEL
Anion gap: 6 (ref 5–15)
BUN: 13 mg/dL (ref 6–20)
CO2: 25 mmol/L (ref 22–32)
Calcium: 8.8 mg/dL — ABNORMAL LOW (ref 8.9–10.3)
Chloride: 107 mmol/L (ref 98–111)
Creatinine, Ser: 0.75 mg/dL (ref 0.44–1.00)
GFR calc Af Amer: >60 mL/min (ref >60)
GFR calc non Af Amer: >60 mL/min (ref >60)
Glucose, Bld: 92 mg/dL (ref 70–99)
Potassium: 4.2 mmol/L (ref 3.5–5.1)
Sodium: 138 mmol/L (ref 135–145)

## 2019-07-07 LAB — CBC
HCT: 37.9 % (ref 36.0–46.0)
Hemoglobin: 12.5 g/dL (ref 12.0–15.0)
MCH: 29.8 pg (ref 26.0–34.0)
MCHC: 33 g/dL (ref 30.0–36.0)
MCV: 90.5 fL (ref 80.0–100.0)
Platelets: 266 10*3/uL (ref 150–400)
RBC: 4.19 MIL/uL (ref 3.87–5.11)
RDW: 13.5 % (ref 11.5–15.5)
WBC: 9.8 10*3/uL (ref 4.0–10.5)
nRBC: 0 % (ref 0.0–0.2)

## 2019-07-07 LAB — TROPONIN I (HIGH SENSITIVITY)
Troponin I (High Sensitivity): <2 ng/L (ref <18)
Troponin I (High Sensitivity): 3 ng/L (ref <18)

## 2019-07-07 LAB — BRAIN NATRIURETIC PEPTIDE: B Natriuretic Peptide: 15.4 pg/mL (ref 0.0–100.0)

## 2019-07-07 LAB — D-DIMER, QUANTITATIVE: D-Dimer, Quant: 7.08 ug/mL-FEU — ABNORMAL HIGH (ref 0.00–0.50)

## 2019-07-07 LAB — I-STAT BETA HCG BLOOD, ED (MC, WL, AP ONLY): I-stat hCG, quantitative: <5 m[IU]/mL (ref <5)

## 2019-07-07 MED ORDER — IOHEXOL 350 MG/ML SOLN
75.0000 mL | Freq: Once | INTRAVENOUS | Status: AC | PRN
  Administered 2019-07-07: 22:00:00 75 mL via INTRAVENOUS

## 2019-07-07 MED ORDER — CYCLOBENZAPRINE HCL 10 MG PO TABS: 10.0000 mg | ORAL_TABLET | Freq: Two times a day (BID) | ORAL | 0 refills | Status: DC | PRN

## 2019-07-07 NOTE — ED Provider Notes (Signed)
[place] EMERGENCY DEPARTMENT Provider Note   CSN: [id number] Arrival date & time: 07/07/19  0946     History   Chief Complaint Chief Complaint  Patient presents with   Chest Pain   Arm Pain    HPI Madison Welch is a 43 y.o. female.     The history is provided by the patient and medical records. No language interpreter was used.  Chest Pain Pain location:  L chest and L lateral chest Pain quality: aching, dull and sharp   Pain radiates to:  L shoulder and L arm Pain severity:  Moderate Onset quality:  Gradual Duration:  3 days Timing:  Constant Progression:  Waxing and waning Chronicity:  New Context: movement   Relieved by:  Nothing Worsened by:  Certain positions (palpation) Associated symptoms: heartburn, lower extremity edema and numbness (tingling in L arm)   Associated symptoms: no abdominal pain, no anxiety, no back pain, no cough, no diaphoresis, no dizziness, no fatigue, no fever, no headache, no nausea, no palpitations, no shortness of breath, no vomiting and no weakness   Risk factors: obesity   Risk factors: no prior DVT/PE     Past Medical History:  Diagnosis Date   Asthma    Headache(784.0)    Legally blind    Obesity     Patient Active Problem List   Diagnosis Date Noted   Menorrhagia 09/22/2012   Dysmenorrhea 09/22/2012    Past Surgical History:  Procedure Laterality Date   ABDOMINAL HYSTERECTOMY     partial   CESAREAN SECTION     CYSTOSCOPY  09/21/2012   Procedure: CYSTOSCOPY;  Surgeon: Maeola Sarah. Landry Mellow, MD;  Location: [facility];  Service: Gynecology;  Laterality: N/A;  with cystotomy repair   EYE SURGERY     LEG SURGERY     ROBOTIC ASSISTED LAPAROSCOPIC LYSIS OF ADHESION  09/21/2012   Procedure: ROBOTIC ASSISTED LAPAROSCOPIC LYSIS OF ADHESION;  Surgeon: Maeola Sarah. Landry Mellow, MD;  Location: [facility];  Service: Gynecology;  Laterality: N/A;   ROBOTIC ASSISTED TOTAL HYSTERECTOMY  09/21/2012   Procedure: ROBOTIC  ASSISTED TOTAL HYSTERECTOMY;  Surgeon: Maeola Sarah. Landry Mellow, MD;  Location: [facility];  Service: Gynecology;  Laterality: N/A;   TUBAL LIGATION       OB History   No obstetric history on file.      Home Medications    Prior to Admission medications   Medication Sig Start Date End Date Taking? Authorizing Provider  acetaminophen (TYLENOL) 500 MG tablet Take 1,000 mg by mouth every 6 (six) hours as needed for mild pain.     [provider]  albuterol (PROVENTIL HFA;VENTOLIN HFA) 108 (90 BASE) MCG/ACT inhaler Inhale 2 puffs into the lungs every 6 (six) hours as needed. For shortness of breath    [provider]  ibuprofen (ADVIL,MOTRIN) 200 MG tablet Take 800 mg by mouth every 6 (six) hours as needed for pain.     [provider]  methocarbamol (ROBAXIN) 500 MG tablet Take 1 tablet (500 mg total) by mouth every 8 (eight) hours as needed for muscle spasms. 01/24/18   Kinnie Feil, PA-C  promethazine (PHENERGAN) 25 MG tablet Take 1 tablet (25 mg total) by mouth every 6 (six) hours as needed for nausea or vomiting. 01/18/16   Blanchie Dessert, MD    Family History No family history on file.  Social History Social History   Tobacco Use   Smoking status: Current Every Day Smoker   Smokeless tobacco: Never  Used  Substance Use Topics   Alcohol use: Yes   Drug use: No     Allergies   Patient has no known allergies.   Review of Systems Review of Systems  Constitutional: Negative for chills, diaphoresis, fatigue and fever.  HENT: Negative for congestion.   Eyes: Negative for visual disturbance.  Respiratory: Negative for cough, chest tightness, shortness of breath, wheezing and stridor.   Cardiovascular: Positive for chest pain and leg swelling. Negative for palpitations.  Gastrointestinal: Positive for heartburn. Negative for abdominal pain, constipation, diarrhea, nausea and vomiting.  Genitourinary: Negative for dysuria, flank pain and frequency.    Musculoskeletal: Negative for back pain, neck pain and neck stiffness.  Skin: Negative for rash and wound.  Neurological: Positive for numbness (tingling in L arm). Negative for dizziness, weakness, light-headedness and headaches.  Psychiatric/Behavioral: Negative for agitation.  All other systems reviewed and are negative.    Physical Exam Updated Vital Signs BP 137/80 (BP Location: Right Wrist)    Pulse 81    Temp 98.2 F (36.8 C) (Oral)    Resp 18    LMP 09/16/2012    SpO2 100%   Physical Exam Vitals signs and nursing note reviewed.  Constitutional:      General: She is not in acute distress.    Appearance: She is well-developed. She is not ill-appearing, toxic-appearing or diaphoretic.  HENT:     Head: Normocephalic and atraumatic.     Right Ear: External ear normal.     Left Ear: External ear normal.     Nose: Nose normal.     Mouth/Throat:     Pharynx: No oropharyngeal exudate.  Eyes:     Conjunctiva/sclera: Conjunctivae normal.     Pupils: Pupils are equal, round, and reactive to light.  Neck:     Musculoskeletal: Normal range of motion and neck supple.  Cardiovascular:     Rate and Rhythm: Normal rate and regular rhythm.  No extrasystoles are present.    Heart sounds: No murmur. No systolic murmur.  Pulmonary:     Effort: Pulmonary effort is normal. No tachypnea or respiratory distress.     Breath sounds: No stridor. No decreased breath sounds, wheezing, rhonchi or rales.  Chest:     Chest wall: Tenderness present. No mass, deformity or edema.  Abdominal:     General: There is no distension.     Tenderness: There is no abdominal tenderness. There is no rebound.  Musculoskeletal:     Left upper arm: She exhibits tenderness and swelling.       Arms:     Right lower leg: She exhibits no tenderness. No edema.     Left lower leg: She exhibits no tenderness. No edema.  Skin:    General: Skin is warm.     Capillary Refill: Capillary refill takes less than 2  seconds.     Coloration: Skin is not pale.     Findings: No erythema or rash.  Neurological:     General: No focal deficit present.     Mental Status: She is alert and oriented to person, place, and time.     Cranial Nerves: No cranial nerve deficit.     Motor: No weakness or abnormal muscle tone.     Coordination: Coordination normal.     Deep Tendon Reflexes: Reflexes are normal and symmetric.  Psychiatric:        Mood and Affect: Mood normal.      ED Treatments / Results  Labs (  all labs ordered are listed, but only abnormal results are displayed) Labs Reviewed  BASIC METABOLIC PANEL - Abnormal; Notable for the following components:      Result Value   Calcium 8.8 (*)    All other components within normal limits  D-DIMER, QUANTITATIVE (NOT AT [facility]) - Abnormal; Notable for the following components:   D-Dimer, Quant 7.08 (*)    All other components within normal limits  CBC  BRAIN NATRIURETIC PEPTIDE  I-STAT BETA HCG BLOOD, ED ([facility], [facility], [facility] ONLY)  TROPONIN I (HIGH SENSITIVITY)  TROPONIN I (HIGH SENSITIVITY)    EKG EKG Interpretation  Date/Time:  Friday July 07 2019 10:00:08 EDT Ventricular Rate:  77 PR Interval:  160 QRS Duration: 78 QT Interval:  392 QTC Calculation: 443 R Axis:   41 Text Interpretation:  Normal sinus rhythm with sinus arrhythmia Low voltage QRS Cannot rule out Anterior infarct , age undetermined Abnormal ECG When compared to prior, no signficant changes seen.  No STEMI Confirmed by Antony Blackbird [phone]) on 07/07/2019 3:56:21 PM   Radiology Dg Chest 2 View  Result Date: 07/07/2019 CLINICAL DATA:  Chest pain. EXAM: CHEST - 2 VIEW COMPARISON:  Radiographs of April 22, 2019. FINDINGS: The heart size and mediastinal contours are within normal limits. Both lungs are clear. No pneumothorax or pleural effusion is noted. The visualized skeletal structures are unremarkable. IMPRESSION: No active cardiopulmonary disease. Electronically Signed   By: Marijo Conception M.D.   On: 07/07/2019 10:24   Ct Angio Chest Pe W And/or Wo Contrast  Result Date: 07/07/2019 CLINICAL DATA:  Left chest pain radiating into the left arm with weakness. EXAM: CT ANGIOGRAPHY CHEST WITH CONTRAST TECHNIQUE: Multidetector CT imaging of the chest was performed using the standard protocol during bolus administration of intravenous contrast. Multiplanar CT image reconstructions and MIPs were obtained to evaluate the vascular anatomy. CONTRAST:  11mL OMNIPAQUE IOHEXOL 350 MG/ML SOLN COMPARISON:  Chest radiographs 07/07/2019 FINDINGS: Cardiovascular: Evaluation of segmental and subsegmental pulmonary arteries is limited by patient body habitus and mild respiratory motion artifact. There is no lobar or more proximal pulmonary embolus, and no convincing segmental emboli are identified with heterogeneous density of some subsegmental vessels (for example in the right lower lobe) attributed to artifact. The thoracic aorta is normal in caliber. The heart is normal in size. There is no pericardial effusion. Mediastinum/Nodes: No enlarged axillary, mediastinal, or hilar lymph nodes. Unremarkable esophagus and included portion of the thyroid. Lungs/Pleura: No pleural effusion or pneumothorax. Clear lungs. Upper Abdomen: Punctate left adrenal calcification. Musculoskeletal: Moderate lower thoracic spondylosis. Review of the MIP images confirms the above findings. IMPRESSION: Limited evaluation of the segmental and subsegmental pulmonary arteries. No central pulmonary embolus or other acute abnormality identified. Electronically Signed   By: Logan Bores M.D.   On: 07/07/2019 22:50   Dg Humerus Left  Result Date: 07/07/2019 CLINICAL DATA:  Lateral humeral pain EXAM: LEFT HUMERUS - 2+ VIEW COMPARISON:  None. FINDINGS: There is no evidence of fracture or other focal bone lesions. There is mild arthropathy of the acromioclavicular joint. Soft tissues are unremarkable. IMPRESSION: Negative. Electronically  Signed   By: Kathreen Devoid   On: 07/07/2019 18:17   Vas [place] Lower Extremity Venous (dvt) (only [facility])  Result Date: 07/07/2019  Lower Venous Study Indications: Swelling.  Limitations: Body habitus, poor ultrasound/tissue interface and depth of vessels. Comparison Study: No prior study Performing Technologist: Maudry Mayhew RDMS, RVT, RDCS  Examination Guidelines: A complete evaluation includes B-mode  imaging, spectral Doppler, color Doppler, and power Doppler as needed of all accessible portions of each vessel. Bilateral testing is considered an integral part of a complete examination. Limited examinations for reoccurring indications may be performed as noted.  +-------+---------------+---------+-----------+----------+--------------+  RIGHT   Compressibility Phasicity Spontaneity Properties Thrombus Aging  +-------+---------------+---------+-----------+----------+--------------+  FV Prox Full                                                             +-------+---------------+---------+-----------+----------+--------------+  FV Mid  Full                                                             +-------+---------------+---------+-----------+----------+--------------+  POP     Full            Yes       Yes                                    +-------+---------------+---------+-----------+----------+--------------+  PTV     Full                                                             +-------+---------------+---------+-----------+----------+--------------+  PERO    Full                                                             +-------+---------------+---------+-----------+----------+--------------+ Unable to visualize right saphenofemoral junction, common femoral artery, profunda femoral artery, and distal femoral artery due to technical limitations.  +-------+---------------+---------+-----------+----------+--------------+  LEFT    Compressibility Phasicity Spontaneity Properties Thrombus Aging   +-------+---------------+---------+-----------+----------+--------------+  FV Prox Full                                                             +-------+---------------+---------+-----------+----------+--------------+  FV Mid  Full                                                             +-------+---------------+---------+-----------+----------+--------------+  POP     Full            Yes       Yes                                    +-------+---------------+---------+-----------+----------+--------------+  PTV     Full                                                             +-------+---------------+---------+-----------+----------+--------------+  PERO    Full                                                             +-------+---------------+---------+-----------+----------+--------------+ Unable to visualize right saphenofemoral junction, common femoral artery, profunda femoral artery, and distal femoral artery due to technical limitations.    Summary: Right: There is no evidence of deep vein thrombosis in the lower extremity. However, portions of this examination were limited- see technologist comments above. No cystic structure found in the popliteal fossa. Left: There is no evidence of deep vein thrombosis in the lower extremity. However, portions of this examination were limited- see technologist comments above. No cystic structure found in the popliteal fossa.  *See table(s) above for measurements and observations.    Preliminary     Procedures Procedures (including critical care time)  EMERGENCY DEPARTMENT  US GUIDANCE EXAM Emergency Ultrasound:  US Guidance for Needle Guidance  INDICATIONS: Difficult vascular access Linear probe used in real-time to visualize location of needle entry through skin.   PERFORMED BY: Myself IMAGES ARCHIVED?: Yes LIMITATIONS: body habitus VIEWS USED: Transverse INTERPRETATION: Needle visualized within vein, Left arm and Needle gauge  18   Medications Ordered in ED Medications  iohexol (OMNIPAQUE) 350 MG/ML injection 75 mL (75 mLs Intravenous Contrast Given 07/07/19 2224)     Initial Impression / Assessment and Plan / ED Course  I have reviewed the triage vital signs and the nursing notes.  Pertinent labs & imaging results that were available during my care of the patient were reviewed by me and considered in my medical decision making (see chart for details).        Madison Welch is a 43 y.o. female with a past medical history significant for asthma and obesity who presents with left-sided chest pain into her left arm.  Patient reports that she has had a "knot" in her left humeral area for the last year or 2.  She reports it started hurting recently.  She reports that over the last several days she has been having pain in her left pectoral lateral chest that does radiate to her arm.  She thinks it is not causing problems.  She has no family history of heart disease and no history of DVT or PE.  She does report that her left ankle has been slightly more swollen than the right with some pain.  She reports that her chest pain is an aching and muscular type pain in her left chest.  She denies nausea, vomiting, diaphoresis.  No shortness of breath.  Symptoms are not exertional or pleuritic.  It is worse with palpation of that area by report.  She reports some tingling into her left hand which she also attributes to the knot.  She denies trauma or other injuries.  She denies other complaints Including no fevers or chills, congestion, or cough.  On exam, left chest is tender  to palpation reproducing her discomfort.  Left humerus area is tender on this hard subcutaneous "knot".  She has good pulses in both extremities, good grip strength, and sensation.  She did not have the numbness and tingling on my exam.  Legs are nonedematous on my exam however patient thinks her left ankle is more swollen than the right.  Abdomen nontender.   Patient resting comfortably on room air.  EKG shows no STEMI.  Heart score calculated as is a 1.  Clinically I have low suspicion for concern etiology of her chest discomfort.  Will get humeral x-ray given the knot that is tender.  Suspect lipoma that is causing irritation which she was informed of in the past.  Will get d-dimer and ultrasound of the left leg.  If work-up is reassuring, anticipate discharge with muscle relaxant for likely musculoskeletal etiology of her pain.      D-dimer returned elevated at 7.08.  Troponin negative.  BNP normal.  Ultrasound of the legs showed no DVT.  Humerus x-ray normal.  Chest x-ray reassuring.  I performed an ultrasound-guided ultrasound IV as IV team and nursing was unable to place it.  Patient will get CT PE study to rule out PE as cause of chest pain.  If PE study is reassuring, anticipate discharge with muscle relaxant for likely musculoskeletal type pain.  PE study was negative.  Patient was feeling better and was reassured after imaging.  Continue to suspect musculoskeletal pain.    Patient was discharged with Flexeril and follow-up with PCP.  She agreed with plan of care and patient was discharged in good condition.  Final Clinical Impressions(s) / ED Diagnoses   Final diagnoses:  Chest wall pain  Atypical chest pain    ED Discharge Orders         Ordered    cyclobenzaprine (FLEXERIL) 10 MG tablet  2 times daily PRN     07/07/19 2257         Clinical Impression: 1. Chest wall pain   2. Atypical chest pain     Disposition: Discharge  Condition: Good  I have discussed the results, Dx and Tx plan with the pt(& family if present). He/she/they expressed understanding and agree(s) with the plan. Discharge instructions discussed at great length. Strict return precautions discussed and pt &/or family have verbalized understanding of the instructions. No further questions at time of discharge.    Discharge Medication List as of  07/07/2019 10:58 PM    START taking these medications   Details  cyclobenzaprine (FLEXERIL) 10 MG tablet Take 1 tablet (10 mg total) by mouth 2 (two) times daily as needed for muscle spasms., Starting Fri 07/07/2019, Print        Follow Up: [place] [phone] Schedule an appointment as soon as possible for a visit    [place] [address] [id number] m[facility] [place]       Apurva Reily, Gwenyth Allegra, MD 07/08/19 [phone]

## 2019-07-07 NOTE — Discharge Instructions (Signed)
Your work-up today did not show evidence of a cardiac cause of your symptoms, your ultrasounds were negative for blood clot in your legs, and the CT scan did not show blood clot in your lungs.  The test to look for heart failure was negative and your other labs are reassuring.  We suspect based on your history and exam that your symptoms are due to musculoskeletal pain.  Please use the muscle relaxant to help with the tender muscles we were examining.  Please follow-up with a primary doctor.  If any symptoms change or worsen, please return to the nearest emergency department.

## 2019-07-07 NOTE — Progress Notes (Signed)
Bilateral lower extremity venous duplex completed. Refer to "CV Proc" under chart review to view preliminary results.  07/07/2019 6:51 PM Maudry Mayhew, MHA, RVT, RDCS, RDMS

## 2019-07-07 NOTE — ED Triage Notes (Signed)
Pt to ER for evaluation of left chest pain radiating into left arm with left arm weakness. Reports has a "mass" on her left arm. Also noted today to have bruising "all over both legs," and has not experienced any injury.

## 2019-07-07 NOTE — ED Notes (Signed)
Attempted to get IV in pt twice w/o success. Put in consult for IV team.

## 2019-07-13 NOTE — Progress Notes (Signed)
[place]  Telephone:([phone]) [phone] [phone]) [phone]    ID: Madison Welch DOB: 1976/04/27  MR#: [id number]  [id number]  Patient Care Team: Lucianne Lei, MD as PCP - General (Family Medicine) Ples Trudel, Virgie Dad, MD as Consulting Physician (Hematology and Oncology) OTHER MD:   CHIEF COMPLAINT: Bruising  CURRENT TREATMENT: Work-up in progress   HISTORY OF CURRENT ILLNESS: Madison Welch tells me that about 2 months ago she noted a bruise on the outside of her right thigh.  She did not recall any injury there.  She then had a bruise in the left lower leg.  She has had multiple bruises since then, with no apparent trauma that she is aware of.  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  She presented to the ED on 07/07/2019 complaining of left chest pain radiating into her left arm with left arm weakness. She reported a mass on her left arm and bruising on her bilateral legs. She noted some pedal edema left greater than right with mild pain. She denied recent traumas or injuries.   She underwent a chest and a left humerus xray on 07/07/2019, which were both negative for acute findings. She also underwent a CT angiography chest with contrast on the same day showing Limited evaluation of the segmental and subsegmental pulmonary arteries. No central pulmonary embolus or other acute abnormality identified. A bilateral vascular lower extremity ultrasound was performed on the same day revealing no evidence of deep vein thrombosis.   D-dimer was obtained and returned elevated at 7.08.   The patient's subsequent history is as detailed below.   INTERVAL HISTORY: Madison Welch was evaluated in the hematology clinic on 07/14/2019.    REVIEW OF SYSTEMS: Aside from the problem with bruising and cramping Madison Welch denies a significant bleeding or clotting history.  She had 2 pregnancies, multiple dental extractions,  simple hysterectomy, and no oozing from any of those procedures.  She does have mild epistaxis sometimes when she has sinus problems.  This is a little bit of blood on tissue when she blows her nose.  Sometimes when she brushes her teeth there is a little bit of blood.  Her gums are not in good repair and that might be the cause of that.  She denies hematemesis or hematochezia or any melena or rectal bleeding except from hemorrhoids.  Addition as noted below there is no significant bleeding or clotting history in the family.  Madison Welch denies unusual headaches, visual changes, nausea, vomiting, stiff neck, dizziness, or gait imbalance. There has been no cough, phlegm production, or pleurisy, no chest pain or pressure, and no change in bowel or bladder habits. The patient denies fever, rash, unexplained fatigue or unexplained weight loss. A detailed review of systems was otherwise entirely negative.   PAST MEDICAL HISTORY: Past Medical History:  Diagnosis Date   Asthma    Headache(784.0)    Legally blind    Obesity      PAST SURGICAL HISTORY: Past Surgical History:  Procedure Laterality Date   ABDOMINAL HYSTERECTOMY     partial   CESAREAN SECTION     CYSTOSCOPY  09/21/2012   Procedure: CYSTOSCOPY;  Surgeon: Maeola Sarah. Landry Mellow, MD;  Location: [facility];  Service: Gynecology;  Laterality: N/A;  with cystotomy repair   EYE SURGERY     LEG SURGERY     ROBOTIC ASSISTED LAPAROSCOPIC LYSIS OF ADHESION  09/21/2012  Procedure: ROBOTIC ASSISTED LAPAROSCOPIC LYSIS OF ADHESION;  Surgeon: Maeola Sarah. Landry Mellow, MD;  Location: [facility];  Service: Gynecology;  Laterality: N/A;   ROBOTIC ASSISTED TOTAL HYSTERECTOMY  09/21/2012   Procedure: ROBOTIC ASSISTED TOTAL HYSTERECTOMY;  Surgeon: Maeola Sarah. Landry Mellow, MD;  Location: [facility];  Service: Gynecology;  Laterality: N/A;   TUBAL LIGATION       FAMILY HISTORY: No family history on file. Madison Welch's father died in 02/04/17 from lung cancer metastatic to the brain.  He apparently  did have a pulmonary embolus in the setting of advanced lung cancer.. Patients' mother is 76 year old as of September 2020. The patient has 1 brothers and 1 sister. Patient denies anyone in her family having breast, ovarian, prostate, or pancreatic cancer.  One third cousin (father's sisters grandson) had hemophilia   GYNECOLOGIC HISTORY:  Patient's last menstrual period was 09/16/2012. Menarche: 43 years old Age at first live birth: 43 years old [place] P: 2 LMP: STATUS post partial hysterectomy in Feb 05, 2012* BSO?:  No   SOCIAL HISTORY: (Current as of 07/14/2019) [place] the [place] area following her friend and cousin Madison Welch and also so that her own children would have better opportunities.  She describes herself a single.  Her daughter Madison Welch is getting married October 2020 and will graduate from college May 2021 with a degree in [place] and psychology.  The patient's daughter Madison Welch, 40, is very active in sports and doing very well in school.  At home also is the patient's mother who is terminally ill with severe heart and lung problems served by [facility].  There is a DNR in place.   ADVANCED DIRECTIVES: Not in place   HEALTH MAINTENANCE: Social History   Tobacco Use   Smoking status: Current Every Day Smoker   Smokeless tobacco: Never Used  Substance Use Topics   Alcohol use: Yes   Drug use: No    Colonoscopy: 2019/[facility]  PAP: STATUS post hysterectomy  Bone density: No Mammography: No  No Known Allergies  Current Outpatient Medications  Medication Sig Dispense Refill   albuterol (ACCUNEB) 0.63 MG/3ML nebulizer solution Take 1 ampule by nebulization daily as needed for shortness of breath ((mix with budesonide)).     albuterol (PROVENTIL HFA;VENTOLIN HFA) 108 (90 BASE) MCG/ACT inhaler Inhale 2 puffs into the lungs every 6 (six) hours as needed for wheezing or shortness of breath.      Ascorbic Acid (VITAMIN C PO) Take 1 tablet by  mouth every morning.     Aspirin-Acetaminophen-Caffeine (GOODY HEADACHE PO) Take 2 packets by mouth 2 (two) times daily as needed (pain/headache).     budesonide (PULMICORT) 0.5 MG/2ML nebulizer solution Take 0.5 mg by nebulization daily as needed (shortness of breath (mix with abulterol)).     cetirizine (ZYRTEC) 10 MG tablet Take 10 mg by mouth every morning.     cyclobenzaprine (FLEXERIL) 10 MG tablet Take 1 tablet (10 mg total) by mouth 2 (two) times daily as needed for muscle spasms. 20 tablet 0   Menthol, Topical Analgesic, (BIOFREEZE EX) Apply 1 application topically 3 (three) times daily as needed (pain).     Multiple Vitamin (MULTIVITAMIN WITH MINERALS) TABS tablet Take 1 tablet by mouth 3 times/day as needed-between meals & bedtime. GOODY POWDERS USES UP TO 4 TIMES A WEEK     OVER THE COUNTER MEDICATION Take 2 capsules by mouth every morning.     No current facility-administered medications for this visit.  OBJECTIVE: Morbidly obese African-American woman in no acute distress  Vitals:   07/14/19 1125  BP: 127/70  Pulse: 84  Resp: 20  Temp: 98.3 F (36.8 C)  SpO2: 100%   Wt Readings from Last 3 Encounters:  07/14/19 (!) 332 lb 9.6 oz (150.9 kg)  04/22/19 (!) 320 lb (145.2 kg)  01/24/18 (!) 305 lb (138.3 kg)   Body mass index is 57.09 kg/m.    ECOG FS:1 - Symptomatic but completely ambulatory  Ocular: Sclerae unicteric, pupils round and equal Ear-nose-throat: Wearing a mask; history of multiple maxillary extractions Lymphatic: No cervical or supraclavicular adenopathy Lungs no rales or rhonchi Heart regular rate and rhythm Abd soft, nontender, positive bowel sounds MSK no focal spinal tenderness, palpable mass in the outer aspect of the left upper extremity, not as firm as a lymph node on not as soft as a fatty tumor; may be a residual hematoma (but no hyperpigmentation or erythema) Neuro: non-focal, well-oriented, appropriate affect Breasts:  Deferred  Left upper extremity showing ecchymosis, 07/14/2019    LAB RESULTS:  CMP     Component Value Date/Time   NA 138 07/07/2019 1007   K 4.2 07/07/2019 1007   CL 107 07/07/2019 1007   CO2 25 07/07/2019 1007   GLUCOSE 92 07/07/2019 1007   BUN 13 07/07/2019 1007   CREATININE 0.75 07/07/2019 1007   CALCIUM 8.8 (L) 07/07/2019 1007   PROT 6.7 04/22/2019 1229   ALBUMIN 3.2 (L) 04/22/2019 1229   AST 20 04/22/2019 1229   ALT 17 04/22/2019 1229   ALKPHOS 96 04/22/2019 1229   BILITOT 0.7 04/22/2019 1229   GFRNONAA >60 07/07/2019 1007   GFRAA >60 07/07/2019 1007    No results found for: TOTALPROTELP, ALBUMINELP, A1GS, A2GS, BETS, BETA2SER, GAMS, MSPIKE, SPEI  No results found for: KPAFRELGTCHN, LAMBDASER, KAPLAMBRATIO  Lab Results  Component Value Date   WBC 9.8 07/07/2019   NEUTROABS 6.9 04/22/2019   HGB 12.5 07/07/2019   HCT 37.9 07/07/2019   MCV 90.5 07/07/2019   PLT 266 07/07/2019    No results found for: LABCA2  No components found for: [id number]  No results for input(s): INR in the last 168 hours.  No results found for: LABCA2  No results found for: [id number]  No results found for: [id number]  No results found for: [id number]  No results found for: CA2729  No components found for: HGQUANT  No results found for: CEA1 / No results found for: CEA1   No results found for: AFPTUMOR  No results found for: CHROMOGRNA  No results found for: PSA1  No visits with results within 3 Day(s) from this visit.  Latest known visit with results is:  Admission on 07/07/2019, Discharged on 07/07/2019  Component Date Value Ref Range Status   Sodium 07/07/2019 138  135 - 145 mmol/L Final   Potassium 07/07/2019 4.2  3.5 - 5.1 mmol/L Final   Chloride 07/07/2019 107  98 - 111 mmol/L Final   CO2 07/07/2019 25  22 - 32 mmol/L Final   Glucose, Bld 07/07/2019 92  70 - 99 mg/dL Final   BUN 07/07/2019 13  6 - 20 mg/dL Final   Creatinine, Ser 07/07/2019 0.75  0.44 - 1.00  mg/dL Final   Calcium 07/07/2019 8.8* 8.9 - 10.3 mg/dL Final   GFR calc non Af Amer 07/07/2019 >60  >60 mL/min Final   GFR calc Af Amer 07/07/2019 >60  >60 mL/min Final   Anion gap 07/07/2019 6  5 - 15 Final  Performed at [facility], [place] [address] [place], [place] [address]   WBC 07/07/2019 9.8  4.0 - 10.5 K/uL Final   RBC 07/07/2019 4.19  3.87 - 5.11 MIL/uL Final   Hemoglobin 07/07/2019 12.5  12.0 - 15.0 g/dL Final   HCT 07/07/2019 37.9  36.0 - 46.0 % Final   MCV 07/07/2019 90.5  80.0 - 100.0 fL Final   MCH 07/07/2019 29.8  26.0 - 34.0 pg Final   MCHC 07/07/2019 33.0  30.0 - 36.0 g/dL Final   RDW 07/07/2019 13.5  11.5 - 15.5 % Final   Platelets 07/07/2019 266  150 - 400 K/uL Final   nRBC 07/07/2019 0.0  0.0 - 0.2 % Final   Performed at [facility], [place] [address], [place], [place] [address]   Troponin I (High Sensitivity) 07/07/2019 <2  <18 ng/L Final   Performed at [facility] [address], [place], [place] [address]   I-stat hCG, quantitative 07/07/2019 <5.0  <5 mIU/mL Final   Comment 3 07/07/2019          Final   Comment:   GEST. AGE      CONC.  (mIU/mL)   <=1 WEEK        5 - 50     2 WEEKS       50 - 500     3 WEEKS       100 - 10,000     4 WEEKS     1,000 - 30,000        FEMALE AND NON-PREGNANT FEMALE:     LESS THAN 5 mIU/mL    Troponin I (High Sensitivity) 07/07/2019 3  <18 ng/L Final   Comment: (NOTE) Elevated high sensitivity troponin I (hsTnI) values and significant  changes across serial measurements may suggest ACS but many other  chronic and acute conditions are known to elevate hsTnI results.  Refer to the "Links" section for chest pain algorithms and additional  guidance. Performed at [facility], [place] [address], [place], [place] [address]    D-Dimer, Quant 07/07/2019 7.08* 0.00 - 0.50 ug/mL-FEU Final   Comment: (NOTE) At the manufacturer cut-off of 0.50 ug/mL FEU, this assay has been documented to exclude  PE with a sensitivity and negative predictive value of 97 to 99%.  At this time, this assay has not been approved by the FDA to exclude DVT/VTE. Results should be correlated with clinical presentation. Performed at [facility], [place] [address], [place], [place] [address]    B Natriuretic Peptide 07/07/2019 15.4  0.0 - 100.0 pg/mL Final   Performed at [facility] [address] [place], [place] [address]    (this displays the last labs from the last 3 days)  No results found for: TOTALPROTELP, ALBUMINELP, A1GS, A2GS, BETS, BETA2SER, GAMS, MSPIKE, SPEI (this displays SPEP labs)  No results found for: KPAFRELGTCHN, LAMBDASER, KAPLAMBRATIO (kappa/lambda light chains)  No results found for: HGBA, HGBA2QUANT, HGBFQUANT, HGBSQUAN (Hemoglobinopathy evaluation)   No results found for: LDH  No results found for: IRON, TIBC, IRONPCTSAT (Iron and TIBC)  No results found for: FERRITIN  Urinalysis    Component Value Date/Time   COLORURINE YELLOW 01/17/2016 2341   APPEARANCEUR CLOUDY (A) 01/17/2016 2341   LABSPEC 1.034 (H) 01/17/2016 2341   PHURINE 5.5 01/17/2016 2341   GLUCOSEU NEGATIVE 01/17/2016 2341   HGBUR TRACE (A) 01/17/2016 2341   BILIRUBINUR NEGATIVE 01/17/2016 2341   [place]  01/17/2016 2341   PROTEINUR NEGATIVE 01/17/2016 2341   UROBILINOGEN 1.0 11/05/2014 2304   NITRITE NEGATIVE 01/17/2016 2341   LEUKOCYTESUR SMALL (A) 01/17/2016 2341     STUDIES:  Dg Chest 2 View  Result Date: 07/07/2019 CLINICAL DATA:  Chest pain. EXAM: CHEST - 2 VIEW COMPARISON:  Radiographs of April 22, 2019. FINDINGS: The heart size and mediastinal contours are within normal limits. Both lungs are clear. No pneumothorax or pleural effusion is noted. The visualized skeletal structures are unremarkable. IMPRESSION: No active cardiopulmonary disease. Electronically Signed   By: Marijo Conception M.D.   On: 07/07/2019 10:24   Ct Angio Chest Pe W And/or Wo Contrast  Result Date:  07/07/2019 CLINICAL DATA:  Left chest pain radiating into the left arm with weakness. EXAM: CT ANGIOGRAPHY CHEST WITH CONTRAST TECHNIQUE: Multidetector CT imaging of the chest was performed using the standard protocol during bolus administration of intravenous contrast. Multiplanar CT image reconstructions and MIPs were obtained to evaluate the vascular anatomy. CONTRAST:  55mL OMNIPAQUE IOHEXOL 350 MG/ML SOLN COMPARISON:  Chest radiographs 07/07/2019 FINDINGS: Cardiovascular: Evaluation of segmental and subsegmental pulmonary arteries is limited by patient body habitus and mild respiratory motion artifact. There is no lobar or more proximal pulmonary embolus, and no convincing segmental emboli are identified with heterogeneous density of some subsegmental vessels (for example in the right lower lobe) attributed to artifact. The thoracic aorta is normal in caliber. The heart is normal in size. There is no pericardial effusion. Mediastinum/Nodes: No enlarged axillary, mediastinal, or hilar lymph nodes. Unremarkable esophagus and included portion of the thyroid. Lungs/Pleura: No pleural effusion or pneumothorax. Clear lungs. Upper Abdomen: Punctate left adrenal calcification. Musculoskeletal: Moderate lower thoracic spondylosis. Review of the MIP images confirms the above findings. IMPRESSION: Limited evaluation of the segmental and subsegmental pulmonary arteries. No central pulmonary embolus or other acute abnormality identified. Electronically Signed   By: Logan Bores M.D.   On: 07/07/2019 22:50   Dg Humerus Left  Result Date: 07/07/2019 CLINICAL DATA:  Lateral humeral pain EXAM: LEFT HUMERUS - 2+ VIEW COMPARISON:  None. FINDINGS: There is no evidence of fracture or other focal bone lesions. There is mild arthropathy of the acromioclavicular joint. Soft tissues are unremarkable. IMPRESSION: Negative. Electronically Signed   By: Kathreen Devoid   On: 07/07/2019 18:17   Vas [place] Lower Extremity Venous (dvt) (only  [facility])  Result Date: 07/09/2019  Lower Venous Study Indications: Swelling.  Limitations: Body habitus, poor ultrasound/tissue interface and depth of vessels. Comparison Study: No prior study Performing Technologist: Maudry Mayhew RDMS, RVT, RDCS  Examination Guidelines: A complete evaluation includes B-mode imaging, spectral Doppler, color Doppler, and power Doppler as needed of all accessible portions of each vessel. Bilateral testing is considered an integral part of a complete examination. Limited examinations for reoccurring indications may be performed as noted.  +-------+---------------+---------+-----------+----------+--------------+  RIGHT   Compressibility Phasicity Spontaneity Properties Thrombus Aging  +-------+---------------+---------+-----------+----------+--------------+  FV Prox Full                                                             +-------+---------------+---------+-----------+----------+--------------+  FV Mid  Full                                                             +-------+---------------+---------+-----------+----------+--------------+  POP     Full            Yes       Yes                                    +-------+---------------+---------+-----------+----------+--------------+  PTV     Full                                                             +-------+---------------+---------+-----------+----------+--------------+  PERO    Full                                                             +-------+---------------+---------+-----------+----------+--------------+ Unable to visualize right saphenofemoral junction, common femoral artery, profunda femoral artery, and distal femoral artery due to technical limitations.  +-------+---------------+---------+-----------+----------+--------------+  LEFT    Compressibility Phasicity Spontaneity Properties Thrombus Aging  +-------+---------------+---------+-----------+----------+--------------+  FV Prox Full                                                              +-------+---------------+---------+-----------+----------+--------------+  FV Mid  Full                                                             +-------+---------------+---------+-----------+----------+--------------+  POP     Full            Yes       Yes                                    +-------+---------------+---------+-----------+----------+--------------+  PTV     Full                                                             +-------+---------------+---------+-----------+----------+--------------+  PERO    Full                                                             +-------+---------------+---------+-----------+----------+--------------+ Unable to visualize right saphenofemoral junction, common femoral artery, profunda femoral artery, and distal femoral artery due to technical limitations.    Summary: Right: There is no evidence of deep vein thrombosis in the lower extremity. However, portions of  this examination were limited- see technologist comments above. No cystic structure found in the popliteal fossa. Left: There is no evidence of deep vein thrombosis in the lower extremity. However, portions of this examination were limited- see technologist comments above. No cystic structure found in the popliteal fossa.  *See table(s) above for measurements and observations. Electronically signed by Ruta Hinds MD on 07/09/2019 at 11:06:38 AM.    Final      ELIGIBLE FOR AVAILABLE RESEARCH PROTOCOL: no   ASSESSMENT: 43 y.o. [place], [place] woman with a 70-month history of unexplained bruising, presenting to the emergency room on 07/07/2019 with left upper extremity pain, found to have an elevated d-dimer, but with negative CT NGO and negative bilateral lower extremity Doppler ultrasonography  PLAN: I spent approximately 60 minutes face to face with Madison Welch with more than 50% of that time spent in counseling and coordination of care.  We discussed  normal coagulation and she understands that once the fibrin clot is made it begins to be dissolved and that d-dimer is just 1 of the fragments of fibrin made when the clot is being dissolved.  Accordingly her high d-dimer is simply due to her having clots somewhere.  She does have unexplained bruises.  As these clots then clot dissolution and d-dimer elevation results.  In short the d-dimer in itself gives [place] no other information than that she has been bruising and clotting abnormally.  I do not find either a personal family history suggestive of a bleeding diathesis.  I am starting her work-up by obtaining a DIC panel which will give [place] a fibrinogen level, INR, and PTT as well as a blood film to review.  She had a normal platelet count but if the screening tests are all normal we may need to check for abnormal platelet function.  Von Willebrand disease is uncommon in African-Americans but can occur and of course acquired von Willebrand disease is more common in older people.  I reassured her that I do not find any evidence of cancer although cancer can increase the d-dimer.  As far as the mass in the left upper extremity, we could do an ultrasound or MRI, but perhaps starting with simple surgical evaluation is the best way to go and I am referring her to surgery with that in mind.  She should not take aspirin, Goody powders, or nonsteroidal anti-inflammatories for pain.  I am tentatively making a return appointment for her here in a few months but the results of the upcoming lab tests will guide the rest of the work-up.   Demetric Parslow, Virgie Dad, MD  07/14/19 12:27 PM Medical Oncology and Hematology [facility] [place], [place] [address] Tel. [phone]    Fax. [phone]   I, Jacqualyn Posey am acting as a Education administrator for Chauncey Cruel, MD.   I, Lurline Del MD, have reviewed the above documentation for accuracy and completeness, and I agree with the  above.

## 2019-07-14 ENCOUNTER — Other Ambulatory Visit: Payer: Self-pay

## 2019-07-14 ENCOUNTER — Other Ambulatory Visit: Payer: Self-pay | Admitting: Oncology

## 2019-07-14 ENCOUNTER — Telehealth: Payer: Self-pay | Admitting: Oncology

## 2019-07-14 ENCOUNTER — Inpatient Hospital Stay: Payer: 59

## 2019-07-14 ENCOUNTER — Inpatient Hospital Stay: Payer: 59 | Attending: Oncology | Admitting: Oncology

## 2019-07-14 VITALS — BP 127/70 | HR 84 | Temp 98.3°F | Resp 20 | Ht 64.0 in | Wt 332.6 lb

## 2019-07-14 DIAGNOSIS — R7989 Other specified abnormal findings of blood chemistry: Secondary | ICD-10-CM

## 2019-07-14 DIAGNOSIS — R233 Spontaneous ecchymoses: Secondary | ICD-10-CM

## 2019-07-14 DIAGNOSIS — N924 Excessive bleeding in the premenopausal period: Secondary | ICD-10-CM

## 2019-07-14 DIAGNOSIS — R2232 Localized swelling, mass and lump, left upper limb: Secondary | ICD-10-CM | POA: Diagnosis not present

## 2019-07-14 DIAGNOSIS — R791 Abnormal coagulation profile: Secondary | ICD-10-CM | POA: Diagnosis not present

## 2019-07-14 DIAGNOSIS — Z720 Tobacco use: Secondary | ICD-10-CM

## 2019-07-14 DIAGNOSIS — R936 Abnormal findings on diagnostic imaging of limbs: Secondary | ICD-10-CM

## 2019-07-14 DIAGNOSIS — Z801 Family history of malignant neoplasm of trachea, bronchus and lung: Secondary | ICD-10-CM | POA: Diagnosis not present

## 2019-07-14 DIAGNOSIS — R238 Other skin changes: Secondary | ICD-10-CM

## 2019-07-14 LAB — CBC WITH DIFFERENTIAL/PLATELET
Abs Immature Granulocytes: 0.03 10*3/uL (ref 0.00–0.07)
Basophils Absolute: 0 10*3/uL (ref 0.0–0.1)
Basophils Relative: 0 %
Eosinophils Absolute: 0.4 10*3/uL (ref 0.0–0.5)
Eosinophils Relative: 4 %
HCT: 36.9 % (ref 36.0–46.0)
Hemoglobin: 12.1 g/dL (ref 12.0–15.0)
Immature Granulocytes: 0 %
Lymphocytes Relative: 24 %
Lymphs Abs: 2.7 10*3/uL (ref 0.7–4.0)
MCH: 29.4 pg (ref 26.0–34.0)
MCHC: 32.8 g/dL (ref 30.0–36.0)
MCV: 89.8 fL (ref 80.0–100.0)
Monocytes Absolute: 0.6 10*3/uL (ref 0.1–1.0)
Monocytes Relative: 5 %
Neutro Abs: 7.6 10*3/uL (ref 1.7–7.7)
Neutrophils Relative %: 67 %
Platelets: 275 10*3/uL (ref 150–400)
RBC: 4.11 MIL/uL (ref 3.87–5.11)
RDW: 13.3 % (ref 11.5–15.5)
WBC: 11.4 10*3/uL — ABNORMAL HIGH (ref 4.0–10.5)
nRBC: 0 % (ref 0.0–0.2)

## 2019-07-14 LAB — COMPREHENSIVE METABOLIC PANEL
ALT: 12 U/L (ref 0–44)
AST: 18 U/L (ref 15–41)
Albumin: 3.6 g/dL (ref 3.5–5.0)
Alkaline Phosphatase: 105 U/L (ref 38–126)
Anion gap: 8 (ref 5–15)
BUN: 14 mg/dL (ref 6–20)
CO2: 27 mmol/L (ref 22–32)
Calcium: 9 mg/dL (ref 8.9–10.3)
Chloride: 104 mmol/L (ref 98–111)
Creatinine, Ser: 0.75 mg/dL (ref 0.44–1.00)
GFR calc Af Amer: >60 mL/min (ref >60)
GFR calc non Af Amer: >60 mL/min (ref >60)
Glucose, Bld: 91 mg/dL (ref 70–99)
Potassium: 4 mmol/L (ref 3.5–5.1)
Sodium: 139 mmol/L (ref 135–145)
Total Bilirubin: 0.3 mg/dL (ref 0.3–1.2)
Total Protein: 7.5 g/dL (ref 6.5–8.1)

## 2019-07-14 LAB — DIC (DISSEMINATED INTRAVASCULAR COAGULATION)PANEL
D-Dimer, Quant: 1.29 ug/mL-FEU — ABNORMAL HIGH (ref 0.00–0.50)
Fibrinogen: 538 mg/dL — ABNORMAL HIGH (ref 210–475)
INR: 1 (ref 0.8–1.2)
Platelets: 280 10*3/uL (ref 150–400)
Prothrombin Time: 13.2 seconds (ref 11.4–15.2)
Smear Review: NONE SEEN
aPTT: 32 seconds (ref 24–36)

## 2019-07-14 LAB — SAVE SMEAR(SSMR), FOR PROVIDER SLIDE REVIEW

## 2019-07-14 MED ORDER — TRAMADOL HCL 50 MG PO TABS: ORAL_TABLET | ORAL | 0 refills | Status: DC

## 2019-07-14 MED ORDER — ACETAMINOPHEN 500 MG PO TABS: ORAL_TABLET | ORAL | 0 refills | Status: AC

## 2019-07-14 NOTE — Telephone Encounter (Signed)
I could not reach patient regarding schedule mailbox was full 

## 2019-07-14 NOTE — Progress Notes (Unsigned)
I called Madison Welch and let her know that her d-dimer is already way down although not quite yet normal.  I asked her to stop taking Goody powders, avoid nonsteroidals, and use only Tylenol or tramadol for pain  I am going to repeat a d-dimer in 6 weeks.  That may be all she needs

## 2019-07-20 ENCOUNTER — Encounter: Payer: Self-pay | Admitting: Oncology

## 2019-07-21 ENCOUNTER — Other Ambulatory Visit: Payer: Self-pay | Admitting: *Deleted

## 2019-07-21 DIAGNOSIS — R936 Abnormal findings on diagnostic imaging of limbs: Secondary | ICD-10-CM

## 2019-07-24 ENCOUNTER — Ambulatory Visit (HOSPITAL_COMMUNITY): Admission: RE | Admit: 2019-07-24 | Payer: 59 | Source: Ambulatory Visit

## 2019-07-24 ENCOUNTER — Telehealth: Payer: Self-pay | Admitting: Adult Health

## 2019-07-24 NOTE — Telephone Encounter (Signed)
Spent 4 min on phone for peer to peer for Madison Welch MRI.  Approval # [id number], authorized through 09/07/2019.  Wilber Bihari, NP

## 2019-07-25 ENCOUNTER — Other Ambulatory Visit: Payer: Self-pay

## 2019-07-25 ENCOUNTER — Other Ambulatory Visit: Payer: Self-pay | Admitting: Oncology

## 2019-07-25 ENCOUNTER — Ambulatory Visit (HOSPITAL_COMMUNITY)
Admission: RE | Admit: 2019-07-25 | Discharge: 2019-07-25 | Disposition: A | Discharge status: Home / Self Care | Payer: 59 | Source: Ambulatory Visit | Attending: Oncology | Admitting: Oncology

## 2019-07-25 DIAGNOSIS — R936 Abnormal findings on diagnostic imaging of limbs: Secondary | ICD-10-CM | POA: Insufficient documentation

## 2019-07-27 ENCOUNTER — Other Ambulatory Visit: Payer: Self-pay | Admitting: Oncology

## 2019-07-29 ENCOUNTER — Other Ambulatory Visit: Payer: Self-pay | Admitting: Oncology

## 2019-08-02 ENCOUNTER — Other Ambulatory Visit: Payer: Self-pay | Admitting: Oncology

## 2019-08-03 MED ORDER — TRAMADOL HCL 50 MG PO TABS: ORAL_TABLET | ORAL | 0 refills | Status: DC

## 2019-08-03 NOTE — Telephone Encounter (Signed)
Refill request

## 2019-08-21 ENCOUNTER — Other Ambulatory Visit: Payer: Self-pay | Admitting: Oncology

## 2019-08-24 ENCOUNTER — Other Ambulatory Visit: Payer: Self-pay | Admitting: Oncology

## 2019-08-25 ENCOUNTER — Inpatient Hospital Stay: Payer: 59 | Attending: Oncology

## 2019-09-15 ENCOUNTER — Other Ambulatory Visit: Payer: Self-pay | Admitting: Oncology

## 2019-09-17 ENCOUNTER — Other Ambulatory Visit: Payer: Self-pay | Admitting: Oncology

## 2019-09-19 MED ORDER — TRAMADOL HCL 50 MG PO TABS: ORAL_TABLET | ORAL | 0 refills | Status: DC

## 2019-10-06 ENCOUNTER — Other Ambulatory Visit: Payer: Self-pay | Admitting: Oncology

## 2019-10-06 MED ORDER — TRAMADOL HCL 50 MG PO TABS: ORAL_TABLET | ORAL | 0 refills | Status: DC

## 2019-10-16 ENCOUNTER — Other Ambulatory Visit: Payer: Self-pay | Admitting: Oncology

## 2019-10-16 ENCOUNTER — Encounter: Payer: Self-pay | Admitting: Oncology

## 2019-10-16 MED ORDER — TRAMADOL HCL 50 MG PO TABS: ORAL_TABLET | ORAL | 0 refills | Status: DC

## 2019-10-16 NOTE — Progress Notes (Signed)
No show

## 2019-10-17 ENCOUNTER — Other Ambulatory Visit: Payer: 59

## 2019-10-17 ENCOUNTER — Ambulatory Visit (HOSPITAL_BASED_OUTPATIENT_CLINIC_OR_DEPARTMENT_OTHER): Payer: 59 | Admitting: Oncology

## 2019-10-17 ENCOUNTER — Telehealth: Payer: Self-pay | Admitting: Oncology

## 2019-10-17 DIAGNOSIS — R238 Other skin changes: Secondary | ICD-10-CM

## 2019-10-17 DIAGNOSIS — Z6841 Body Mass Index (BMI) 40.0 and over, adult: Secondary | ICD-10-CM

## 2019-10-17 NOTE — Telephone Encounter (Signed)
Returned patient's phone call regarding rescheduling an appointment, left a voicemail. 

## 2019-10-18 ENCOUNTER — Other Ambulatory Visit: Payer: Self-pay | Admitting: Oncology

## 2019-10-30 MED ORDER — TRAMADOL HCL 50 MG PO TABS: ORAL_TABLET | ORAL | 0 refills | Status: DC

## 2019-10-30 NOTE — Telephone Encounter (Signed)
Pt missed last appt with you and does not currently have another appt.

## 2019-11-06 ENCOUNTER — Other Ambulatory Visit: Payer: Self-pay | Admitting: Oncology

## 2019-11-13 ENCOUNTER — Other Ambulatory Visit: Payer: Self-pay | Admitting: Oncology

## 2019-11-14 ENCOUNTER — Other Ambulatory Visit: Payer: Self-pay | Admitting: *Deleted

## 2019-11-14 MED ORDER — TRAMADOL HCL 50 MG PO TABS: ORAL_TABLET | ORAL | 0 refills | Status: DC

## 2019-11-14 NOTE — Telephone Encounter (Signed)
refill 

## 2019-11-27 ENCOUNTER — Other Ambulatory Visit: Payer: Self-pay | Admitting: Oncology

## 2019-11-30 ENCOUNTER — Other Ambulatory Visit: Payer: Self-pay | Admitting: Oncology

## 2019-11-30 NOTE — Telephone Encounter (Signed)
Script was refilled two weeks ago and is requesting another refill.  Please refuse or refill based on your recommendations.

## 2019-12-01 MED ORDER — TRAMADOL HCL 50 MG PO TABS: ORAL_TABLET | ORAL | 0 refills | Status: DC

## 2019-12-04 ENCOUNTER — Other Ambulatory Visit: Payer: Self-pay | Admitting: *Deleted

## 2019-12-04 ENCOUNTER — Other Ambulatory Visit: Payer: Self-pay | Admitting: Oncology

## 2019-12-05 ENCOUNTER — Telehealth: Payer: Self-pay | Admitting: *Deleted

## 2019-12-05 MED ORDER — TRAMADOL HCL 50 MG PO TABS: ORAL_TABLET | ORAL | 0 refills | Status: DC

## 2019-12-06 ENCOUNTER — Other Ambulatory Visit: Payer: Self-pay | Admitting: *Deleted

## 2019-12-06 DIAGNOSIS — D1722 Benign lipomatous neoplasm of skin and subcutaneous tissue of left arm: Secondary | ICD-10-CM

## 2019-12-19 ENCOUNTER — Other Ambulatory Visit: Payer: Self-pay | Admitting: Oncology

## 2019-12-19 MED ORDER — TRAMADOL HCL 50 MG PO TABS: ORAL_TABLET | ORAL | 0 refills | Status: DC

## 2019-12-19 NOTE — Telephone Encounter (Signed)
Last filled on 12/05/19 for 30 tabs

## 2019-12-20 ENCOUNTER — Other Ambulatory Visit: Payer: Self-pay | Admitting: Oncology

## 2019-12-20 MED ORDER — TRAMADOL HCL 50 MG PO TABS: ORAL_TABLET | ORAL | 0 refills | Status: DC

## 2019-12-20 NOTE — Telephone Encounter (Signed)
No entry 

## 2019-12-27 ENCOUNTER — Other Ambulatory Visit: Payer: Self-pay | Admitting: General Surgery

## 2019-12-27 DIAGNOSIS — M542 Cervicalgia: Secondary | ICD-10-CM

## 2020-01-03 ENCOUNTER — Other Ambulatory Visit: Payer: Self-pay | Admitting: Oncology

## 2020-01-03 NOTE — Telephone Encounter (Signed)
Patient request refill

## 2020-01-04 ENCOUNTER — Other Ambulatory Visit: Payer: Self-pay | Admitting: Oncology

## 2020-01-04 ENCOUNTER — Encounter: Payer: Self-pay | Admitting: Oncology

## 2020-01-22 ENCOUNTER — Other Ambulatory Visit: Payer: 59

## 2020-02-28 ENCOUNTER — Other Ambulatory Visit: Payer: Self-pay | Admitting: Oncology

## 2020-03-12 ENCOUNTER — Other Ambulatory Visit: Payer: Self-pay | Admitting: Family Medicine

## 2020-03-12 ENCOUNTER — Ambulatory Visit
Admission: RE | Admit: 2020-03-12 | Discharge: 2020-03-12 | Disposition: A | Discharge status: Home / Self Care | Payer: 59 | Source: Ambulatory Visit | Attending: Family Medicine | Admitting: Family Medicine

## 2020-03-12 DIAGNOSIS — M79662 Pain in left lower leg: Secondary | ICD-10-CM

## 2020-03-12 DIAGNOSIS — M79661 Pain in right lower leg: Secondary | ICD-10-CM

## 2021-03-15 IMAGING — CR PORTABLE CHEST - 1 VIEW
1 series · 1 of 1 positions shown · non-contrast
Comparison: 12/30/2015

CLINICAL DATA: Chest pain, cough

EXAM:
PORTABLE CHEST 1 VIEW

[AP]
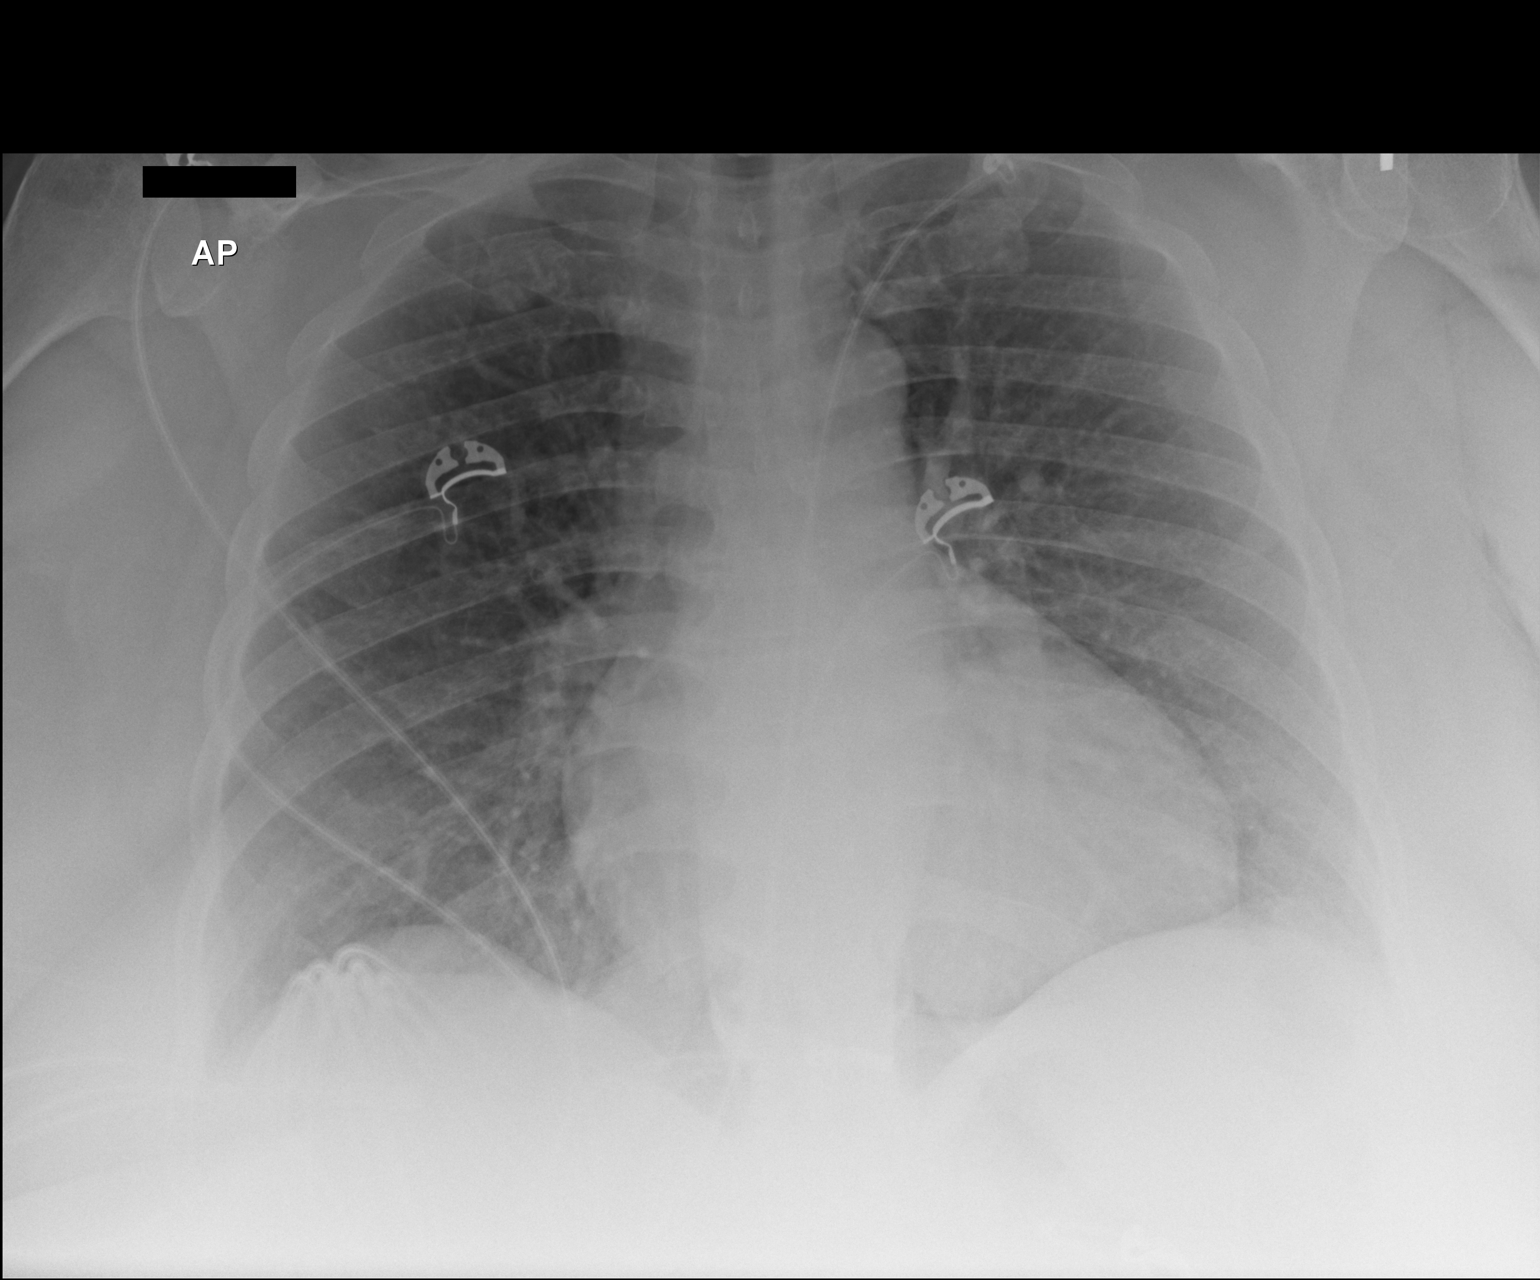

[1 of 1 positions shown; findings below may reference images not displayed]

FINDINGS: The heart size and mediastinal contours are within normal limits.
Both lungs are clear. The visualized skeletal structures are
unremarkable.
IMPRESSION: No active disease.

## 2021-04-23 ENCOUNTER — Other Ambulatory Visit: Payer: Self-pay | Admitting: Radiology

## 2022-10-20 ENCOUNTER — Encounter (HOSPITAL_BASED_OUTPATIENT_CLINIC_OR_DEPARTMENT_OTHER): Payer: Self-pay | Admitting: Emergency Medicine

## 2022-10-20 ENCOUNTER — Emergency Department (HOSPITAL_BASED_OUTPATIENT_CLINIC_OR_DEPARTMENT_OTHER): Payer: BC Managed Care – PPO | Admitting: Radiology

## 2022-10-20 ENCOUNTER — Emergency Department (HOSPITAL_BASED_OUTPATIENT_CLINIC_OR_DEPARTMENT_OTHER)
Admission: EM | Admit: 2022-10-20 | Discharge: 2022-10-20 | Disposition: A | Discharge status: Home / Self Care | Payer: BC Managed Care – PPO | Attending: Emergency Medicine | Admitting: Emergency Medicine

## 2022-10-20 ENCOUNTER — Other Ambulatory Visit: Payer: Self-pay

## 2022-10-20 DIAGNOSIS — M940 Chondrocostal junction syndrome [Tietze]: Secondary | ICD-10-CM

## 2022-10-20 DIAGNOSIS — S29011A Strain of muscle and tendon of front wall of thorax, initial encounter: Secondary | ICD-10-CM | POA: Insufficient documentation

## 2022-10-20 DIAGNOSIS — T148XXA Other injury of unspecified body region, initial encounter: Secondary | ICD-10-CM

## 2022-10-20 DIAGNOSIS — X500XXA Overexertion from strenuous movement or load, initial encounter: Secondary | ICD-10-CM | POA: Diagnosis not present

## 2022-10-20 DIAGNOSIS — R109 Unspecified abdominal pain: Secondary | ICD-10-CM | POA: Diagnosis present

## 2022-10-20 LAB — URINALYSIS, ROUTINE W REFLEX MICROSCOPIC
Bacteria, UA: NONE SEEN
Bilirubin Urine: NEGATIVE
Glucose, UA: NEGATIVE mg/dL
Ketones, ur: NEGATIVE mg/dL
Leukocytes,Ua: NEGATIVE
Nitrite: NEGATIVE
Protein, ur: NEGATIVE mg/dL
Specific Gravity, Urine: 1.015 (ref 1.005–1.030)
pH: 5 (ref 5.0–8.0)

## 2022-10-20 LAB — CBC
HCT: 37.2 % (ref 36.0–46.0)
Hemoglobin: 12 g/dL (ref 12.0–15.0)
MCH: 28.1 pg (ref 26.0–34.0)
MCHC: 32.3 g/dL (ref 30.0–36.0)
MCV: 87.1 fL (ref 80.0–100.0)
Platelets: 279 10*3/uL (ref 150–400)
RBC: 4.27 MIL/uL (ref 3.87–5.11)
RDW: 14.7 % (ref 11.5–15.5)
WBC: 10.9 10*3/uL — ABNORMAL HIGH (ref 4.0–10.5)
nRBC: 0 % (ref 0.0–0.2)

## 2022-10-20 LAB — BASIC METABOLIC PANEL
Anion gap: 10 (ref 5–15)
BUN: 14 mg/dL (ref 6–20)
CO2: 23 mmol/L (ref 22–32)
Calcium: 9.3 mg/dL (ref 8.9–10.3)
Chloride: 104 mmol/L (ref 98–111)
Creatinine, Ser: 0.63 mg/dL (ref 0.44–1.00)
GFR, Estimated: >60 mL/min (ref >60)
Glucose, Bld: 122 mg/dL — ABNORMAL HIGH (ref 70–99)
Potassium: 4.4 mmol/L (ref 3.5–5.1)
Sodium: 137 mmol/L (ref 135–145)

## 2022-10-20 MED ORDER — LIDOCAINE 5 % EX PTCH
1.0000 | MEDICATED_PATCH | CUTANEOUS | Status: DC
  Administered 2022-10-20: 1 via TRANSDERMAL
  Filled 2022-10-20: qty 1

## 2022-10-20 MED ORDER — KETOROLAC TROMETHAMINE 10 MG PO TABS: 10.0000 mg | ORAL_TABLET | Freq: Four times a day (QID) | ORAL | 0 refills | Status: DC | PRN

## 2022-10-20 MED ORDER — HYDROCODONE-ACETAMINOPHEN 5-325 MG PO TABS
1.0000 | ORAL_TABLET | Freq: Once | ORAL | Status: AC
  Administered 2022-10-20: 1 via ORAL
  Filled 2022-10-20: qty 1

## 2022-10-20 MED ORDER — CYCLOBENZAPRINE HCL 5 MG PO TABS
5.0000 mg | ORAL_TABLET | Freq: Once | ORAL | Status: AC
  Administered 2022-10-20: 5 mg via ORAL
  Filled 2022-10-20: qty 1

## 2022-10-20 MED ORDER — CYCLOBENZAPRINE HCL 5 MG PO TABS: 5.0000 mg | ORAL_TABLET | Freq: Three times a day (TID) | ORAL | 0 refills | Status: DC | PRN

## 2022-10-20 MED ORDER — KETOROLAC TROMETHAMINE 30 MG/ML IJ SOLN
30.0000 mg | Freq: Once | INTRAMUSCULAR | Status: AC
  Administered 2022-10-20: 30 mg via INTRAMUSCULAR
  Filled 2022-10-20: qty 1

## 2022-10-20 MED ORDER — LIDOCAINE 4 % EX PTCH: 1.0000 | MEDICATED_PATCH | CUTANEOUS | 0 refills | Status: AC

## 2022-10-20 NOTE — ED Triage Notes (Signed)
Pt arrives to ED with c/o right sided flank pain that started on 12/31.

## 2022-10-20 NOTE — ED Notes (Signed)
Patient transported to X-ray 

## 2022-10-20 NOTE — Discharge Instructions (Addendum)
Please follow-up with your primary care doctor, use ice and heat to help with the pain control.  If you develop severe shortness of breath chest pain please return to the ER.

## 2022-10-20 NOTE — ED Provider Notes (Signed)
[facility]ERGENC[facility] DEPT Provider Note   CSN: [id number] Arrival date & time: 10/20/22  4332     History  Chief Complaint  Patient presents with   Flank Pain    Madison Welch is a 47 y.o. female, no pertinent past medical history, who presents to the ED secondary to right flank pain that started about 2 days ago.  She states that it is around her right upper rib cage, radiates from the front to the back, and is sharp and stabbing in nature, worse with movement.  Denies any known trauma, recent illness.  Does state that she was at her aunties on Sunday, and lifted heavy Duffy's duffel bag and that is when the pain started, and has had a slight cough since then.  Denies any respiratory symptoms such as shortness of breath, chest pain.  Has not had any abdominal pain, nausea or vomiting.  Afebrile.  Has not tried anything for the pain.  She states that it hurts a lot when she coughs.  Denies any urinary symptoms.    Home Medications Prior to Admission medications   Medication Sig Start Date End Date Taking? Authorizing Provider  ketorolac (TORADOL) 10 MG tablet Take 1 tablet (10 mg total) by mouth every 6 (six) hours as needed. 10/20/22  Yes Madison Rosier L, PA  lidocaine (HM LIDOCAINE PATCH) 4 % Place 1 patch onto the skin daily. 10/20/22  Yes Madison Pehl L, PA  acetaminophen (TYLENOL) 500 MG tablet Take one tablet (500 mg) every 6 hours as neededfor mild pain and two tablets (1000 mg) every 8 hours as needed for moderate pain. Maximum 6 tablets daily. Do not take aleve, advil, ibuprofen, aspirin, goody powders, or similar medications 07/14/19   Madison Welch  albuterol (ACCUNEB) 0.63 MG/3ML nebulizer solution Take 1 ampule by nebulization daily as needed for shortness of breath ((mix with budesonide)).    Provider, Historical, Welch  albuterol (PROVENTIL HFA;VENTOLIN HFA) 108 (90 BASE) MCG/ACT inhaler Inhale 2 puffs into the lungs every 6 (six) hours as needed for wheezing  or shortness of breath.     Provider, Historical, Welch  Ascorbic Acid (VITAMIN C PO) Take 1 tablet by mouth every morning.    Provider, Historical, Welch  budesonide (PULMICORT) 0.5 MG/2ML nebulizer solution Take 0.5 mg by nebulization daily as needed (shortness of breath (mix with abulterol)).    Provider, Historical, Welch  cetirizine (ZYRTEC) 10 MG tablet Take 10 mg by mouth every morning.    Provider, Historical, Welch  cyclobenzaprine (FLEXERIL) 10 MG tablet Take 1 tablet (10 mg total) by mouth 2 (two) times daily as needed for muscle spasms. 07/07/19   Madison Welch  desloratadine (CLARINEX) 5 MG tablet Take 5 mg by mouth daily. 09/09/22   Provider, Historical, Welch  fluticasone (FLONASE) 50 MCG/ACT nasal spray Place into both nostrils. 09/09/22   Provider, Historical, Welch  Menthol, Topical Analgesic, (BIOFREEZE EX) Apply 1 application topically 3 (three) times daily as needed (pain).    Provider, Historical, Welch  Multiple Vitamin (MULTIVITAMIN WITH MINERALS) TABS tablet Take 1 tablet by mouth 3 times/day as needed-between meals & bedtime. GOODY POWDERS USES UP TO 4 TIMES A WEEK    Provider, Historical, Welch  OVER THE COUNTER MEDICATION Take 2 capsules by mouth every morning.    Provider, Historical, Welch  traMADol (ULTRAM) 50 MG tablet TAKE 1 TABLET BY MOUTH EVERY 6 HOURS AS NEEDED FOR SEVERE PAIN. DO NOT TAKE ALEVE, ADVIL, IBUPROFEN, ASPIRIN, GOODY POWDERS  OR SIMILAR MEDICATIONS. 12/20/19   Madison Welch      Allergies    Patient has no known allergies.    Review of Systems   Review of Systems  Constitutional:  Negative for fever.  Respiratory:  Negative for cough and shortness of breath.   Cardiovascular:  Negative for chest pain.  Genitourinary:  Positive for flank pain.    Physical Exam Updated Vital Signs BP 124/71   Pulse 66   Temp 97.9 F (36.6 C)   Resp 20   Ht '5\' 3"'$  (1.6 m)   Wt (!) 145.2 kg   LMP 09/16/2012   SpO2 98%   BMI 56.69 kg/m  Physical Exam Vitals and  nursing note reviewed.  Constitutional:      General: She is not in acute distress.    Appearance: She is well-developed.  HENT:     Head: Normocephalic and atraumatic.  Eyes:     Conjunctiva/sclera: Conjunctivae normal.  Cardiovascular:     Rate and Rhythm: Normal rate and regular rhythm.     Heart sounds: No murmur heard. Pulmonary:     Effort: Pulmonary effort is normal. No respiratory distress.     Breath sounds: Normal breath sounds.  Abdominal:     Palpations: Abdomen is soft.     Tenderness: There is no abdominal tenderness.  Musculoskeletal:        General: No swelling.     Cervical back: Neck supple.     Comments: Tenderness to palpation under right breast, radiating to right back.  Skin:    General: Skin is warm and dry.     Capillary Refill: Capillary refill takes less than 2 seconds.     Comments: No rash under right breast to back.  Or present anywhere else.  Neurological:     Mental Status: She is alert.  Psychiatric:        Mood and Affect: Mood normal.     ED Results / Procedures / Treatments   Labs (all labs ordered are listed, but only abnormal results are displayed) Labs Reviewed  URINALYSIS, ROUTINE W REFLEX MICROSCOPIC - Abnormal; Notable for the following components:      Result Value   Hgb urine dipstick Madison Welch (*)    All other components within normal limits  BASIC METABOLIC PANEL - Abnormal; Notable for the following components:   Glucose, Bld 122 (*)    All other components within normal limits  CBC - Abnormal; Notable for the following components:   WBC 10.9 (*)    All other components within normal limits    EKG None  Radiology DG Chest 2 View  Result Date: 10/20/2022 CLINICAL DATA:  R flank/upper chest pain EXAM: CHEST - 2 VIEW COMPARISON:  Radiograph 07/07/2019 FINDINGS: Unchanged cardiomediastinal silhouette. There is no focal airspace consolidation. There is no pleural effusion or evidence of pneumothorax. There is no acute osseous  abnormality. IMPRESSION: No evidence of acute cardiopulmonary disease. Electronically Signed   By: Madison Simmering M.D.   On: 10/20/2022 10:10    Procedures Procedures    Medications Ordered in ED Medications  lidocaine (LIDODERM) 5 % 1 patch (1 patch Transdermal Patch Applied 10/20/22 0930)  ketorolac (TORADOL) 30 MG/ML injection 30 mg (has no administration in time range)  HYDROcodone-acetaminophen (NORCO/VICODIN) 5-325 MG per tablet 1 tablet (1 tablet Oral Given 10/20/22 0931)  cyclobenzaprine (FLEXERIL) tablet 5 mg (5 mg Oral Given 10/20/22 5102)    ED Course/ Medical Decision Making/ A&P  Medical Decision Making Patient is a 47 year old female, here for right-sided rib cage pain for the last 2 days.  She states that she lifted a heavy duffel bag and started having pain.  She reports tenderness to palpation to the right rib cage, and is sharp and stabbing in nature.  Has not tried anything for this pain.  Triage ordered labs, I will order an x-ray to ensure that she does not have pneumonia or fractured ribs given recent coughing.  We will trial pain medications including Norco, lidocaine, and Flexeril for pain control.  Amount and/or Complexity of Data Reviewed Labs: ordered.    Details: Unremarkable, mild leukocytosis 10.9 K Radiology: ordered.    Details: Unremarkable normal chest x-ray Discussion of management or test interpretation with external provider(s): Patient has tenderness to palpation of right rib cage, and occurred after his lifting heavy duffel bag.  Pain is worse with rotation, and motion in general.  She is PERC negative.  Chest x-ray unremarkable, labs unremarkable.  She denies any chest pain, nausea, vomiting, shortness of breath.  Likely costochondritis, due to muscle strain, discussed with patient, follow-up, given Toradol for pain control, and lidocaine patch for home.  Return precautions emphasized.  Risk OTC drugs. Prescription drug  management.   Final Clinical Impression(s) / ED Diagnoses Final diagnoses:  Muscle strain  Costochondritis    Rx / DC Orders ED Discharge Orders          Ordered    ketorolac (TORADOL) 10 MG tablet  Every 6 hours PRN        10/20/22 1019    lidocaine (HM LIDOCAINE PATCH) 4 %  Every 24 hours        10/20/22 1019              Itxel Wickard, Kathryn, [place] 10/20/22 1024    Blanchie Dessert, Welch 10/25/22 1655

## 2023-07-11 ENCOUNTER — Emergency Department (HOSPITAL_COMMUNITY)
Admission: EM | Admit: 2023-07-11 | Discharge: 2023-07-11 | Disposition: A | Discharge status: Home / Self Care | Payer: BC Managed Care – PPO | Attending: Emergency Medicine | Admitting: Emergency Medicine

## 2023-07-11 ENCOUNTER — Other Ambulatory Visit: Payer: Self-pay

## 2023-07-11 ENCOUNTER — Emergency Department (HOSPITAL_COMMUNITY): Payer: BC Managed Care – PPO

## 2023-07-11 ENCOUNTER — Encounter (HOSPITAL_COMMUNITY): Payer: Self-pay | Admitting: *Deleted

## 2023-07-11 DIAGNOSIS — Z7951 Long term (current) use of inhaled steroids: Secondary | ICD-10-CM | POA: Insufficient documentation

## 2023-07-11 DIAGNOSIS — J45909 Unspecified asthma, uncomplicated: Secondary | ICD-10-CM | POA: Insufficient documentation

## 2023-07-11 DIAGNOSIS — S8392XA Sprain of unspecified site of left knee, initial encounter: Secondary | ICD-10-CM | POA: Insufficient documentation

## 2023-07-11 DIAGNOSIS — X509XXA Other and unspecified overexertion or strenuous movements or postures, initial encounter: Secondary | ICD-10-CM | POA: Insufficient documentation

## 2023-07-11 DIAGNOSIS — M25562 Pain in left knee: Secondary | ICD-10-CM | POA: Diagnosis present

## 2023-07-11 DIAGNOSIS — Y990 Civilian activity done for income or pay: Secondary | ICD-10-CM | POA: Diagnosis not present

## 2023-07-11 MED ORDER — OXYCODONE-ACETAMINOPHEN 5-325 MG PO TABS
1.0000 | ORAL_TABLET | Freq: Once | ORAL | Status: AC
  Administered 2023-07-11: 1 via ORAL
  Filled 2023-07-11: qty 1

## 2023-07-11 MED ORDER — OXYCODONE-ACETAMINOPHEN 5-325 MG PO TABS: 1.0000 | ORAL_TABLET | Freq: Four times a day (QID) | ORAL | 0 refills | Status: DC | PRN

## 2023-07-11 NOTE — ED Provider Notes (Signed)
[place] EMERGENCY DEPARTMENT AT [facility] Provider Note   CSN: [id number] Arrival date & time: 07/11/23  1828     History  Chief Complaint  Patient presents with   Knee Pain    Madison Welch is a 47 y.o. female with history of asthma who presents to the ER complaining of pain in her left knee. Patient states that about 5 days ago she was at work and got an upsetting phone call about a family member. This caused her to jump up from her chair abruptly, and she believes she stepped wrong in doing so. She has had severe pain in the left knee since that brings her to tears. She describes pain radiating down her left lower leg to her ankle. She has tried ibuprofen, biofreeze, tiger balm, all with minimal relief. Notes no other trauma.    Knee Pain      Home Medications Prior to Admission medications   Medication Sig Start Date End Date Taking? Authorizing Provider  oxyCODONE-acetaminophen (PERCOCET/ROXICET) 5-325 MG tablet Take 1 tablet by mouth every 6 (six) hours as needed for severe pain. 07/11/23  Yes Jermya Dowding T, PA-C  acetaminophen (TYLENOL) 500 MG tablet Take one tablet (500 mg) every 6 hours as neededfor mild pain and two tablets (1000 mg) every 8 hours as needed for moderate pain. Maximum 6 tablets daily. Do not take aleve, advil, ibuprofen, aspirin, goody powders, or similar medications 07/14/19   Magrinat, Valentino Hue, MD  albuterol (ACCUNEB) 0.63 MG/3ML nebulizer solution Take 1 ampule by nebulization daily as needed for shortness of breath ((mix with budesonide)).    [provider]  albuterol (PROVENTIL HFA;VENTOLIN HFA) 108 (90 BASE) MCG/ACT inhaler Inhale 2 puffs into the lungs every 6 (six) hours as needed for wheezing or shortness of breath.     [provider]  Ascorbic Acid (VITAMIN C PO) Take 1 tablet by mouth every morning.    [provider]  budesonide (PULMICORT) 0.5 MG/2ML nebulizer solution Take 0.5 mg by nebulization  daily as needed (shortness of breath (mix with abulterol)).    [provider]  cetirizine (ZYRTEC) 10 MG tablet Take 10 mg by mouth every morning.    [provider]  cyclobenzaprine (FLEXERIL) 5 MG tablet Take 1 tablet (5 mg total) by mouth 3 (three) times daily as needed for muscle spasms. 10/20/22   Small, Brooke L, PA  desloratadine (CLARINEX) 5 MG tablet Take 5 mg by mouth daily. 09/09/22   [provider]  fluticasone (FLONASE) 50 MCG/ACT nasal spray Place into both nostrils. 09/09/22   [provider]  ketorolac (TORADOL) 10 MG tablet Take 1 tablet (10 mg total) by mouth every 6 (six) hours as needed. 10/20/22   Small, Brooke L, PA  lidocaine (HM LIDOCAINE PATCH) 4 % Place 1 patch onto the skin daily. 10/20/22   Small, Brooke L, PA  Menthol, Topical Analgesic, (BIOFREEZE EX) Apply 1 application topically 3 (three) times daily as needed (pain).    [provider]  Multiple Vitamin (MULTIVITAMIN WITH MINERALS) TABS tablet Take 1 tablet by mouth 3 times/day as needed-between meals & bedtime. GOODY POWDERS USES UP TO 4 TIMES A WEEK    [provider]  OVER THE COUNTER MEDICATION Take 2 capsules by mouth every morning.    [provider]      Allergies    Patient has no known allergies.    Review of Systems   Review of Systems  Musculoskeletal:  Positive  for arthralgias.       L knee  All other systems reviewed and are negative.   Physical Exam Updated Vital Signs BP (!) 138/90 (BP Location: Right Arm)   Pulse 92   Temp 98.9 F (37.2 C)   Resp 18   Ht 5\' 3"  (1.6 m)   Wt (!) 145.2 kg   LMP 09/16/2012   SpO2 100%   BMI 56.70 kg/m  Physical Exam Vitals and nursing note reviewed.  Constitutional:      Appearance: Normal appearance.  HENT:     Head: Normocephalic and atraumatic.  Eyes:     Conjunctiva/sclera: Conjunctivae normal.  Cardiovascular:     Pulses:          Posterior tibial pulses are 2+ on the right side  and 2+ on the left side.  Pulmonary:     Effort: Pulmonary effort is normal. No respiratory distress.  Musculoskeletal:     Comments: Pain with passive ROM of the left knee, small effusion noted, no overlying skin changes. No appreciable laxity.  No calf swelling or tenderness, no tenderness or deformities of L ankle  Skin:    General: Skin is warm and dry.  Neurological:     Mental Status: She is alert.  Psychiatric:        Mood and Affect: Mood normal.        Behavior: Behavior normal.     ED Results / Procedures / Treatments   Labs (all labs ordered are listed, but only abnormal results are displayed) Labs Reviewed - No data to display  EKG None  Radiology DG Knee Complete 4 Views Left  Result Date: 07/11/2023 CLINICAL DATA:  Knee injury with worsening pain EXAM: LEFT KNEE - COMPLETE 4+ VIEW COMPARISON:  03/12/2020 FINDINGS: No fracture or malalignment. Moderate tricompartment arthritis of the knee. Small knee effusion. Generalized soft tissue edema IMPRESSION: Moderate tricompartment arthritis of the knee with small effusion. No acute osseous abnormality Electronically Signed   By: Jasmine Pang M.D.   On: 07/11/2023 20:15    Procedures Procedures    Medications Ordered in ED Medications  oxyCODONE-acetaminophen (PERCOCET/ROXICET) 5-325 MG per tablet 1 tablet (1 tablet Oral Given 07/11/23 1859)    ED Course/ Medical Decision Making/ A&P                                 Medical Decision Making Amount and/or Complexity of Data Reviewed Radiology: ordered.  Risk Prescription drug management.   This patient is a 47 y.o. female  who presents to the ED for concern of L knee pain after injury.   Differential diagnoses prior to evaluation: The emergent differential diagnosis includes, but is not limited to,  fracture, dislocation, ligamentous injury, traumatic effusion, septic joint, DVT . This is not an exhaustive differential.   Past Medical History / Co-morbidities  / Social History: asthma  Additional history: Chart reviewed. Pertinent results include: PDMP reviewed  Physical Exam: Physical exam performed. The pertinent findings include: Normal vital signs, no acute distress. L knee swelling with small effusion, pain with passive ROM. No obvious laxity or overlying skin changes. No calf tenderness or swelling. No evidence of septic joint.   Lab Tests/Imaging studies: I personally interpreted labs/imaging and the pertinent results include:  XR of the left knee shows arthritis and small effusion, no other findings. I agree with the radiologist interpretation.  Medications: Percocet was ordered in triage.  I  have reviewed the patients home medicines and have made adjustments as needed.   Disposition: After consideration of the diagnostic results and the patients response to treatment, I feel that emergency department workup does not suggest an emergent condition requiring admission or immediate intervention beyond what has been performed at this time. The plan is: discharge to home with symptomatic management of likely L knee sprain. Provided with immobilizer and prescription for short course of pain medication for break through pain. Given orthopedic follow up information. The patient is safe for discharge and has been instructed to return immediately for worsening symptoms, change in symptoms or any other concerns.  Final Clinical Impression(s) / ED Diagnoses Final diagnoses:  Sprain of left knee, unspecified ligament, initial encounter    Rx / DC Orders ED Discharge Orders          Ordered    oxyCODONE-acetaminophen (PERCOCET/ROXICET) 5-325 MG tablet  Every 6 hours PRN        07/11/23 2049           Portions of this report may have been transcribed using voice recognition software. Every effort was made to ensure accuracy; however, inadvertent computerized transcription errors may be present.    Jeanella Flattery 07/11/23 2120     Linwood Dibbles, MD 07/12/23 1110

## 2023-07-11 NOTE — Discharge Instructions (Addendum)
You were seen in the ER for left knee pain after an injury.  As we discussed, your x-ray showed some arthritis and swelling in your knee, but no broken or dislocated bones. I suspect you likely sprained your knee. We have given you an immobilizer to provide you with some more stability, and I have sent a short course of pain medication you can take at home as needed.   I recommend following up with an orthopedist regarding your symptoms. I've attached their contact information for you to call and make an appointment.  Continue to monitor how you're doing and return to the ER for new or worsening symptoms.

## 2023-07-11 NOTE — ED Triage Notes (Addendum)
The pt fell Monday at work  since then she has had severe lt knee pain and it is not getting any better.  Lmp none

## 2023-07-11 NOTE — ED Notes (Signed)
Called ortho tech for knee immobilizer

## 2023-07-12 ENCOUNTER — Other Ambulatory Visit (INDEPENDENT_AMBULATORY_CARE_PROVIDER_SITE_OTHER): Payer: Self-pay

## 2023-07-12 ENCOUNTER — Other Ambulatory Visit (INDEPENDENT_AMBULATORY_CARE_PROVIDER_SITE_OTHER): Payer: BC Managed Care – PPO

## 2023-07-12 ENCOUNTER — Ambulatory Visit: Payer: BC Managed Care – PPO | Admitting: Surgical

## 2023-07-12 ENCOUNTER — Encounter: Payer: Self-pay | Admitting: Surgical

## 2023-07-12 DIAGNOSIS — M25562 Pain in left knee: Secondary | ICD-10-CM

## 2023-07-12 DIAGNOSIS — M79602 Pain in left arm: Secondary | ICD-10-CM

## 2023-07-12 DIAGNOSIS — M5412 Radiculopathy, cervical region: Secondary | ICD-10-CM

## 2023-07-12 DIAGNOSIS — M25552 Pain in left hip: Secondary | ICD-10-CM | POA: Diagnosis not present

## 2023-07-12 MED ORDER — OXYCODONE-ACETAMINOPHEN 5-325 MG PO TABS: 1.0000 | ORAL_TABLET | Freq: Two times a day (BID) | ORAL | 0 refills | Status: DC | PRN

## 2023-07-12 NOTE — Progress Notes (Signed)
Orthopedic Tech Progress Note Patient Details:  Madison Welch Oct 11, 1976 [id number]  Ortho Devices Type of Ortho Device: Knee Immobilizer Ortho Device/Splint Location: lle Ortho Device/Splint Interventions: Ordered, Application, Adjustment   Post Interventions Patient Tolerated: Well Instructions Provided: Care of device, Adjustment of device  [name] Post 07/12/2023, 12:21 AM

## 2023-07-12 NOTE — Progress Notes (Signed)
Office Visit Note   Patient: Madison Welch           Date of Birth: 12-Dec-1975           MRN: [id number] Visit Date: 07/12/2023 Requested by: Renaye Rakers, MD [address] [place],  [place] [address] PCP: Renaye Rakers, MD  Subjective: Chief Complaint  Patient presents with   Left Knee - Pain    HPI: Madison Welch is a 47 y.o. female who presents to the office reporting multiple joint complaints.  Her primary complaint is left knee pain.  She states that last Tuesday, she got a text message/phone call that was very upsetting to her.  She stood up suddenly from her chair at work and felt sudden severe acute pain in her left knee primarily in the posterior aspect of the knee.  She did not have a popping sensation at the time but she immediately had difficulty putting weight on her leg and had to limp around all day at work.  She states that pain was worse the following day.  She has tried lidocaine patches, Tiger balm, Biofreeze without any significant relief.  Also taking ibuprofen.  Eventually got to the point yesterday that she felt she had to go to the emergency department where she had radiographs demonstrating no fracture or acute osseous abnormality but she does have mild to moderate degenerative changes in all 3 compartments of the left knee.  She did have a prior issue with her knee years ago that was significantly improved with cortisone injection.  No mechanical symptoms in the left knee.  No fevers or chills.  Ambulating in knee immobilizer.  She also complains of left buttock and left groin pain that has been fairly chronic for her and not worse since the incident on last Tuesday.  She will have occasional radicular pain down to the calf but no numbness or tingling in the left leg.  No right leg symptoms.  Most of her pain around the pelvis and back region is located at the top of the left buttock but does not really have a lot of focal lumbar spine pain.  She also complains of  left arm pain and numbness and tingling.  Most of the pain is around the trapezius and scapular region in the left side.  Does have some neck stiffness at times as well.  She has numbness and tingling in her entire left hand with no tingling in the right hand.  She is right-hand dominant.  Numbness and tingling is worse in the fourth, fifth, third fingers.  Lifting her arm overhead helps with the symptoms.  She has radicular pain that travels down the entirety of her left arm down into the left hand.  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 Darnelle Catalan in October 2020 that demonstrated possible lipoma without any remarkable features.  She feels that the lipoma has gotten somewhat bigger over the last several years.              ROS: All systems reviewed are negative as they relate to the chief complaint within the history of present illness.  Patient denies fevers or chills.  Assessment & Plan: Visit Diagnoses:  1. Left arm pain   2. Pain in left hip   3. Radiculopathy, cervical region   4. Acute pain of left knee     Plan: Patient is a  47 year old female who presents for evaluation of multiple joint complaints.  She has left knee pain that is acute as well as chronic buttock pain, groin pain and semiacute neck pain with left-sided radicular symptoms.  She also has a left arm lipoma.  Seems to the 2 things bothering her the most are the likely cervical radiculopathy and the axial load injury to the left knee.  She is having difficulty weightbearing on the left lower extremity.  Main differential diagnosis includes meniscus tear versus acute exacerbation of existing arthritis versus occult fracture which is less likely.  Plan for further evaluation with MRI of the left knee.  Plan to evaluate left-sided radiculopathy with MRI of the cervical spine.  Do not think this is really related to her lipoma which is not tender on exam and has  been imaged several years ago without any concerning findings.  We will see her back after MRI scans to review results and decide next steps.  Follow-Up Instructions: No follow-ups on file.   Orders:  Orders Placed This Encounter  Procedures   XR Lumbar Spine 2-3 Views   XR HIP UNILAT W OR W/O PELVIS 2-3 VIEWS LEFT   XR Cervical Spine 2 or 3 views   MR Cervical Spine w/o contrast   MR Knee Left w/o contrast   Meds ordered this encounter  Medications   oxyCODONE-acetaminophen (PERCOCET/ROXICET) 5-325 MG tablet    Sig: Take 1 tablet by mouth every 12 (twelve) hours as needed for severe pain. Do not take while operating a motor vehicle    Dispense:  14 tablet    Refill:  0      Procedures: No procedures performed   Clinical Data: No additional findings.  Objective: Vital Signs: LMP 09/16/2012   Physical Exam:  Constitutional: Patient appears well-developed HEENT:  Head: Normocephalic Eyes:EOM are normal Neck: Normal range of motion Cardiovascular: Normal rate Pulmonary/chest: Effort normal Neurologic: Patient is alert Skin: Skin is warm Psychiatric: Patient has normal mood and affect  Ortho Exam: Ortho exam demonstrates left knee with positive effusion.  Tenderness over the medial and lateral joint lines.  She is able to perform straight leg raise without extensor lag.  No calf tenderness.  Negative Homans' sign.  Stable to MCL and LCL stressing.  Stable to anterior and posterior drawer.  No calf tenderness.  Negative Homans' sign.  She does have some reproducible pain with hip range of motion that is rated mild.  She has intact EHL, dorsiflexion, plantarflexion, quadricep strength, hamstring strength, hip flexion strength.  Left shoulder with 70 degrees X rotation, 90 degrees abduction, 180 degrees forward elevation passively and actively.  This compared with the right shoulder with 70 degrees external rotation, 120 degrees abduction, 180 degrees forward elevation  passively and actively.  She has excellent rotator cuff strength of supra, infra, subscap rated 5/5 bilaterally.  She has full 5/5 motor strength of bilateral upper extremities except for left sided EPL and finger abduction strength which is rated 4/5.  She does have paresthesias noted throughout the left arm.  Positive Spurling sign reproducing trapezius and radicular left arm pain.  Negative Lhermitte sign.  Positive tenderness throughout the axial cervical spine.  Moderate tenderness over the left AC joint.  Minimal tenderness over the bicipital groove.  Pain is better with lifting her arm overhead.  Specialty Comments:  No specialty comments available.  Imaging: DG Knee Complete 4 Views Left  Result Date: 07/11/2023 CLINICAL DATA:  Knee injury with worsening pain EXAM:  LEFT KNEE - COMPLETE 4+ VIEW COMPARISON:  03/12/2020 FINDINGS: No fracture or malalignment. Moderate tricompartment arthritis of the knee. Small knee effusion. Generalized soft tissue edema IMPRESSION: Moderate tricompartment arthritis of the knee with small effusion. No acute osseous abnormality Electronically Signed   By: Jasmine Pang M.D.   On: 07/11/2023 20:15     PMFS History: Patient Active Problem List   Diagnosis Date Noted   Easy bruising 07/14/2019   Morbid obesity with BMI of 50.0-59.9, adult (HCC) 07/14/2019   Menorrhagia 09/22/2012   Dysmenorrhea 09/22/2012   Past Medical History:  Diagnosis Date   Asthma    Headache(784.0)    Legally blind    Obesity     Family History  Problem Relation Age of Onset   Lung cancer Father        metastatic to brain    Past Surgical History:  Procedure Laterality Date   ABDOMINAL HYSTERECTOMY     partial   CESAREAN SECTION     CYSTOSCOPY  09/21/2012   Procedure: CYSTOSCOPY;  Surgeon: Dorien Chihuahua. Richardson Dopp, MD;  Location: [facility];  Service: Gynecology;  Laterality: N/A;  with cystotomy repair   EYE SURGERY     LEG SURGERY     ROBOTIC ASSISTED LAPAROSCOPIC LYSIS OF ADHESION   09/21/2012   Procedure: ROBOTIC ASSISTED LAPAROSCOPIC LYSIS OF ADHESION;  Surgeon: Dorien Chihuahua. Richardson Dopp, MD;  Location: [facility];  Service: Gynecology;  Laterality: N/A;   ROBOTIC ASSISTED TOTAL HYSTERECTOMY  09/21/2012   Procedure: ROBOTIC ASSISTED TOTAL HYSTERECTOMY;  Surgeon: Dorien Chihuahua. Richardson Dopp, MD;  Location: [facility];  Service: Gynecology;  Laterality: N/A;   TUBAL LIGATION     Social History   Occupational History   Not on file  Tobacco Use   Smoking status: Every Day   Smokeless tobacco: Never  Substance and Sexual Activity   Alcohol use: Yes   Drug use: No   Sexual activity: Not on file

## 2023-07-15 ENCOUNTER — Ambulatory Visit
Admission: RE | Admit: 2023-07-15 | Discharge: 2023-07-15 | Disposition: A | Discharge status: Home / Self Care | Payer: BC Managed Care – PPO | Source: Ambulatory Visit | Attending: Surgical

## 2023-07-15 DIAGNOSIS — M25562 Pain in left knee: Secondary | ICD-10-CM

## 2023-07-19 ENCOUNTER — Telehealth: Payer: Self-pay

## 2023-07-19 NOTE — Telephone Encounter (Signed)
Madison Welch called with results

## 2023-07-19 NOTE — Telephone Encounter (Signed)
-----   Message from [facility] sent at 07/19/2023  5:18 PM EDT ----- I called and discussed MRI findings with Madison Welch.  She is coming in to see Dr. August Saucer on 07/21/2023 and to review her MRI with him.  Probably just needs to have cortisone injection at that time unless she wants to try something different.  She has MRI C-spine pending for 10/13.

## 2023-07-21 ENCOUNTER — Ambulatory Visit (INDEPENDENT_AMBULATORY_CARE_PROVIDER_SITE_OTHER): Payer: BC Managed Care – PPO | Admitting: Orthopedic Surgery

## 2023-07-21 DIAGNOSIS — M1712 Unilateral primary osteoarthritis, left knee: Secondary | ICD-10-CM

## 2023-07-21 DIAGNOSIS — M25562 Pain in left knee: Secondary | ICD-10-CM

## 2023-07-21 DIAGNOSIS — M79602 Pain in left arm: Secondary | ICD-10-CM

## 2023-07-22 ENCOUNTER — Encounter: Payer: Self-pay | Admitting: Orthopedic Surgery

## 2023-07-23 ENCOUNTER — Telehealth: Payer: Self-pay

## 2023-07-23 ENCOUNTER — Encounter: Payer: Self-pay | Admitting: Orthopedic Surgery

## 2023-07-23 ENCOUNTER — Other Ambulatory Visit: Payer: Self-pay | Admitting: Surgical

## 2023-07-23 DIAGNOSIS — M1712 Unilateral primary osteoarthritis, left knee: Secondary | ICD-10-CM

## 2023-07-23 MED ORDER — BUPIVACAINE HCL 0.25 % IJ SOLN
4.0000 mL | INTRAMUSCULAR | Status: AC | PRN
Start: 2023-07-23 — End: 2023-07-23
  Administered 2023-07-23: 4 mL via INTRA_ARTICULAR

## 2023-07-23 MED ORDER — CELECOXIB 100 MG PO CAPS: 100.0000 mg | ORAL_CAPSULE | Freq: Two times a day (BID) | ORAL | 0 refills | Status: DC

## 2023-07-23 MED ORDER — LIDOCAINE HCL 1 % IJ SOLN
5.0000 mL | INTRAMUSCULAR | Status: AC | PRN
Start: 2023-07-23 — End: 2023-07-23
  Administered 2023-07-23: 5 mL

## 2023-07-23 NOTE — Telephone Encounter (Signed)
Patient called concerning her message that she sent through [facility] and stated that she is in a lot of pain.  Please advise.  Thank you.

## 2023-07-23 NOTE — Telephone Encounter (Signed)
Hey Autumn, can we get [place]e approved for gel injection?

## 2023-07-23 NOTE — Progress Notes (Signed)
Office Visit Note   Patient: Madison Welch           Date of Birth: 08/08/1976           MRN: [id number] Visit Date: 07/21/2023 Requested by: Renaye Rakers, MD [address] [place],  [place] [address] PCP: Renaye Rakers, MD  Subjective: Chief Complaint  Patient presents with   Other     Review MRI    HPI: Madison Welch is a 47 y.o. female who presents to the office reporting left knee pain.  She states her knee is about the same.  Using pain patches and ibuprofen.  Pain meds at night.  She has to walk into work.  The brace she wears is irritating.  She works 2 jobs.  MRI scan of the knee shows partial-thickness radial tear involving the posterior horn with mild medial protrusion.  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                ROS: All systems reviewed are negative as they relate to the chief complaint within the history of present illness.  Patient denies fevers or chills.  Assessment & Plan: Visit Diagnoses:  1. Left arm pain   2. Acute pain of left knee     Plan: Impression is left knee pain with small radial tear of the medial meniscus.  We reviewed the MRI scan.  Hard to say if arthroscopic intervention is going to make a meaningful difference in her knee.  She does have significant arthritis already.  We did aspirate and inject Toradol in the knee today.  Should come back to see [facility] for follow-up after her C-spine MRI.  I think arthroscopy and debridement is at best a 50-50 proposition to help her knee symptoms.  Follow-Up Instructions: No follow-ups on file.   Orders:  No orders of the defined types were placed in this encounter.  No orders of the defined types were placed in this encounter.     Procedures: Large Joint Inj: L knee on 07/23/2023 4:52 PM Indications: diagnostic evaluation, joint swelling and pain Details: 18 G 1.5 in needle,  superolateral approach  Arthrogram: No  Medications: 5 mL lidocaine 1 %; 4 mL bupivacaine 0.25 % Outcome: tolerated well, no immediate complications Procedure, treatment alternatives, risks and benefits explained, specific risks discussed. Consent was given by the patient. Immediately prior to procedure a time out was called to verify the correct patient, procedure, equipment, support staff and site/side marked as required. Patient was prepped and draped in the usual sterile fashion.     Toradol injected  Clinical Data: No additional findings.  Objective: Vital Signs: LMP 09/16/2012   Physical Exam:  Constitutional: Patient appears well-developed HEENT:  Head: Normocephalic Eyes:EOM are normal Neck: Normal range of motion Cardiovascular: Normal rate Pulmonary/chest: Effort normal Neurologic: Patient is alert Skin: Skin is warm Psychiatric: Patient has normal mood and affect  Ortho Exam: Ortho exam demonstrates mild effusion left knee.  Collateral crucial ligaments are stable.  Does have medial greater than lateral joint line tenderness with intact extensor mechanism and palpable pedal pulses.  No nerve root tension signs.  No groin pain on the left with internal and external rotation of the leg.  Specialty Comments:  No specialty comments available.  Imaging: No results found.   PMFS History: Patient Active Problem List   Diagnosis Date Noted   Easy bruising 07/14/2019  Morbid obesity with BMI of 50.0-59.9, adult (HCC) 07/14/2019   Menorrhagia 09/22/2012   Dysmenorrhea 09/22/2012   Past Medical History:  Diagnosis Date   Asthma    Headache(784.0)    Legally blind    Obesity     Family History  Problem Relation Age of Onset   Lung cancer Father        metastatic to brain    Past Surgical History:  Procedure Laterality Date   ABDOMINAL HYSTERECTOMY     partial   CESAREAN SECTION     CYSTOSCOPY  09/21/2012   Procedure: CYSTOSCOPY;  Surgeon: Dorien Chihuahua. Richardson Dopp,  MD;  Location: [facility];  Service: Gynecology;  Laterality: N/A;  with cystotomy repair   EYE SURGERY     LEG SURGERY     ROBOTIC ASSISTED LAPAROSCOPIC LYSIS OF ADHESION  09/21/2012   Procedure: ROBOTIC ASSISTED LAPAROSCOPIC LYSIS OF ADHESION;  Surgeon: Dorien Chihuahua. Richardson Dopp, MD;  Location: [facility];  Service: Gynecology;  Laterality: N/A;   ROBOTIC ASSISTED TOTAL HYSTERECTOMY  09/21/2012   Procedure: ROBOTIC ASSISTED TOTAL HYSTERECTOMY;  Surgeon: Dorien Chihuahua. Richardson Dopp, MD;  Location: [facility];  Service: Gynecology;  Laterality: N/A;   TUBAL LIGATION     Social History   Occupational History   Not on file  Tobacco Use   Smoking status: Every Day   Smokeless tobacco: Never  Substance and Sexual Activity   Alcohol use: Yes   Drug use: No   Sexual activity: Not on file

## 2023-07-23 NOTE — Telephone Encounter (Signed)
Pt portal message sent to luke to review

## 2023-07-26 NOTE — Telephone Encounter (Signed)
Can we do prior auth? 

## 2023-07-26 NOTE — Telephone Encounter (Signed)
VOB submitted for Monovisc, left knee 

## 2023-07-29 ENCOUNTER — Other Ambulatory Visit: Payer: Self-pay | Admitting: Surgical

## 2023-07-29 MED ORDER — OXYCODONE-ACETAMINOPHEN 5-325 MG PO TABS: 1.0000 | ORAL_TABLET | Freq: Every evening | ORAL | 0 refills | Status: DC | PRN

## 2023-07-29 NOTE — Telephone Encounter (Signed)
I called patient.  Had extensive discussion about what surgery would entail.  Also discussed the risks and benefits of the procedure especially the risk of worsening her knee pain and symptoms and the fact that her BMI of 56 precludes any knee replacement until she reaches a BMI of 40.  After lengthy discussion, patient is open to trying the gel injection prior to committing to surgery.  She will try this and give it about 2 weeks and if no improvement, next step would be arthroscopy and and debridement.  Autumn, are you okay getting her a work note saying that she should be able to work from home for the next 6 weeks and send it to her on MyChart?

## 2023-07-30 ENCOUNTER — Ambulatory Visit: Payer: BC Managed Care – PPO | Admitting: Surgical

## 2023-08-01 ENCOUNTER — Other Ambulatory Visit: Payer: BC Managed Care – PPO

## 2023-08-09 ENCOUNTER — Other Ambulatory Visit: Payer: Self-pay | Admitting: Surgical

## 2023-08-09 MED ORDER — OXYCODONE-ACETAMINOPHEN 5-325 MG PO TABS: 1.0000 | ORAL_TABLET | Freq: Every evening | ORAL | 0 refills | Status: DC | PRN

## 2023-08-10 ENCOUNTER — Other Ambulatory Visit: Payer: Self-pay

## 2023-08-10 DIAGNOSIS — M1712 Unilateral primary osteoarthritis, left knee: Secondary | ICD-10-CM

## 2023-08-10 NOTE — Telephone Encounter (Signed)
Talked with patient and advised her that her insurance [employer], Madison Welch does not cover any gel injections.  I did advise about TriVisc, but patient declined.  Patient would like to know what the next option would be and would like a call back to discuss?  Please advise.  Thank you

## 2023-08-11 NOTE — Telephone Encounter (Signed)
I called and left voicemail on patient's machine.  Main option at this point would be just living with her knee pain and seeing if it will improve.  Another option would be arthroscopy and debridement as we have discussed before.  Do not really think cortisone injections a great option as that may cause continued damage to the partial-thickness meniscus tear.  Left our office number so she can call us with what she wants to do or send [place] a message on MyChart.

## 2023-08-16 ENCOUNTER — Telehealth: Payer: Self-pay

## 2023-08-16 NOTE — Telephone Encounter (Signed)
Totally, fine by me

## 2023-08-16 NOTE — Telephone Encounter (Signed)
Patient request to have accommodation for her job to continue to work from home. Please advise if you are ok with this?

## 2023-08-17 NOTE — Telephone Encounter (Signed)
Form completed.

## 2023-08-19 ENCOUNTER — Ambulatory Visit (INDEPENDENT_AMBULATORY_CARE_PROVIDER_SITE_OTHER): Payer: BC Managed Care – PPO | Admitting: Surgical

## 2023-08-19 ENCOUNTER — Encounter: Payer: Self-pay | Admitting: Surgical

## 2023-08-19 DIAGNOSIS — M1712 Unilateral primary osteoarthritis, left knee: Secondary | ICD-10-CM

## 2023-08-19 DIAGNOSIS — S83242D Other tear of medial meniscus, current injury, left knee, subsequent encounter: Secondary | ICD-10-CM

## 2023-08-19 NOTE — Progress Notes (Signed)
Office Visit Note   Patient: Madison Welch           Date of Birth: August 13, 1976           MRN: [id number] Visit Date: 08/19/2023 Requested by: Madison Rakers, MD [address] [place],  [place] [address] PCP: Madison Rakers, MD  Subjective: Chief Complaint  Patient presents with   Other    Discuss surgery    HPI: Madison Welch is a 47 y.o. female who presents to the office reporting left knee pain.  Patient is here to discuss left knee surgery after phone discussion on 07/29/2023.  She reports continued knee pain since initial injury about 5 to 6 weeks ago.  May be 5% improvement but no more than this.  She is continuing to limp.  Has not been able to return to work due to the amount of pain she is in and has to work remotely.  She lives in a house with a bedroom upstairs.  Insurance denied Monovisc injection.  She would like to proceed with left knee surgery.  She is pursuing weight loss and has reach out to her PCP about weight loss medication.  Does have family history of DVT with mom and sister having history of DVT and her dad passing away from pulmonary embolus though this was while he was undergoing treatment for cancer..                ROS: All systems reviewed are negative as they relate to the chief complaint within the history of present illness.  Patient denies fevers or chills.  Assessment & Plan: Visit Diagnoses:  1. Arthritis of left knee     Plan: Patient is a 47 year old female who presents for evaluation of left knee pain.  Has history of left knee partial thickness radial tear of the medial meniscus.  Toradol injection has not provided any relief.  Monovisc injection was not approved.  At this point just like we have talked about in the past, [name] is between a rock and a hard place with her knee pain.  She understands that any arthroscopic debridement in the setting of knee arthritis may potentially make her symptoms worse.  She would still like to proceed with  arthroscopic surgery after we discussed the risks and benefits of the procedure including but not limited to the risk of nerve/blood vessel damage, knee stiffness, infection, medical complications such as DVT/PE, increased knee pain afterward.  She understands that if she has increased knee pain afterward the long-term solution for this would be left knee arthroplasty which is not an option for her at her current BMI of around 56.  Would likely need around a BMI of 40 before being able to proceed.  All patient's questions answered.  We will have her follow-up after left knee arthroscopy with meniscal debridement.  Anticipate Xarelto postoperatively with her family history of DVT/PE.  Follow-Up Instructions: Return for After procedure.   Orders:  No orders of the defined types were placed in this encounter.  No orders of the defined types were placed in this encounter.     Procedures: No procedures performed   Clinical Data: No additional findings.  Objective: Vital Signs: LMP 09/16/2012   Physical Exam:  Constitutional: Patient appears well-developed HEENT:  Head: Normocephalic Eyes:EOM are normal Neck: Normal range of motion Cardiovascular: Normal rate Pulmonary/chest: Effort normal Neurologic: Patient is alert Skin: Skin is warm Psychiatric: Patient has normal mood and affect  Ortho Exam: Ortho  exam demonstrates left knee with 3 degrees extension and greater than 90 degrees of knee flexion.  No cellulitis or skin changes noted.  Able to perform straight leg raise without extensor lag.  Positive effusion.  Minimal pain with hip range of motion.  Moderate to severe tenderness over the medial joint line.  Mild to moderate tenderness over the lateral joint line.  Palpable DP pulse of the left lower extremity.  No calf tenderness.  Negative Homans' sign.  Specialty Comments:  No specialty comments available.  Imaging: No results found.   PMFS History: Patient Active Problem  List   Diagnosis Date Noted   Easy bruising 07/14/2019   Morbid obesity with BMI of 50.0-59.9, adult (HCC) 07/14/2019   Menorrhagia 09/22/2012   Dysmenorrhea 09/22/2012   Past Medical History:  Diagnosis Date   Asthma    Headache(784.0)    Legally blind    Obesity     Family History  Problem Relation Age of Onset   Lung cancer Father        metastatic to brain    Past Surgical History:  Procedure Laterality Date   ABDOMINAL HYSTERECTOMY     partial   CESAREAN SECTION     CYSTOSCOPY  09/21/2012   Procedure: CYSTOSCOPY;  Surgeon: Dorien Chihuahua. Richardson Dopp, MD;  Location: [facility];  Service: Gynecology;  Laterality: N/A;  with cystotomy repair   EYE SURGERY     LEG SURGERY     ROBOTIC ASSISTED LAPAROSCOPIC LYSIS OF ADHESION  09/21/2012   Procedure: ROBOTIC ASSISTED LAPAROSCOPIC LYSIS OF ADHESION;  Surgeon: Dorien Chihuahua. Richardson Dopp, MD;  Location: [facility];  Service: Gynecology;  Laterality: N/A;   ROBOTIC ASSISTED TOTAL HYSTERECTOMY  09/21/2012   Procedure: ROBOTIC ASSISTED TOTAL HYSTERECTOMY;  Surgeon: Dorien Chihuahua. Richardson Dopp, MD;  Location: [facility];  Service: Gynecology;  Laterality: N/A;   TUBAL LIGATION     Social History   Occupational History   Not on file  Tobacco Use   Smoking status: Every Day   Smokeless tobacco: Never  Substance and Sexual Activity   Alcohol use: Yes   Drug use: No   Sexual activity: Not on file

## 2023-08-20 ENCOUNTER — Other Ambulatory Visit: Payer: Self-pay | Admitting: Surgical

## 2023-08-20 MED ORDER — OXYCODONE-ACETAMINOPHEN 5-325 MG PO TABS: 1.0000 | ORAL_TABLET | Freq: Every evening | ORAL | 0 refills | Status: DC | PRN

## 2023-08-30 NOTE — Telephone Encounter (Signed)
Can we email her the paperwork that was completed? I guess the fax didn't go through or something.  Debbie, do you have surgery sheet for Madison Welch? Should've been filled out and placed on your desk about one week ago

## 2023-08-31 ENCOUNTER — Telehealth: Payer: Self-pay | Admitting: Surgical

## 2023-08-31 NOTE — Telephone Encounter (Signed)
I have talked with patient. See mychart message thread. Waiting for additional response from [employer]

## 2023-08-31 NOTE — Telephone Encounter (Signed)
Patient called and said that the job didn't receive the paperwork. [phone]

## 2023-08-31 NOTE — Telephone Encounter (Signed)
Yes this is okay, totally

## 2023-09-07 ENCOUNTER — Other Ambulatory Visit: Payer: Self-pay | Admitting: Surgical

## 2023-09-07 ENCOUNTER — Telehealth: Payer: Self-pay | Admitting: Orthopedic Surgery

## 2023-09-07 MED ORDER — OXYCODONE-ACETAMINOPHEN 5-325 MG PO TABS: 1.0000 | ORAL_TABLET | Freq: Every evening | ORAL | 0 refills | Status: DC | PRN

## 2023-09-07 NOTE — Telephone Encounter (Signed)
Patient needed updated work note. I completed and faxed to Rochel [employer]rome [phone] per patients request

## 2023-09-07 NOTE — Telephone Encounter (Signed)
Pt called requesting a call from Lauren F. Pt states it is urgent. Pt did not leave a detailed message. Pt phone number is [phone].

## 2023-09-09 ENCOUNTER — Other Ambulatory Visit: Payer: Self-pay

## 2023-09-27 NOTE — Pre-Procedure Instructions (Addendum)
Surgical Instructions   Your procedure is scheduled on September 30, 2023. Report to [facility] Main Entrance "A" at 7:44 A.M., then check in with the Admitting office. Any questions or running late day of surgery: call [phone]  Questions prior to your surgery date: call [phone], Monday-Friday, 8am-4pm. If you experience any cold or flu symptoms such as cough, fever, chills, shortness of breath, etc. between now and your scheduled surgery, please notify Madison Welch at the above number.     Remember:  Do not eat after midnight the night before your surgery   You may drink clear liquids until 6:44AM the morning of your surgery.   Clear liquids allowed are: Water, Non-Citrus Juices (without pulp), Carbonated Beverages, Clear Tea (no milk, honey, etc.), Black Coffee Only (NO MILK, CREAM OR POWDERED CREAMER of any kind), and Gatorade.    Take these medicines the morning of surgery with A SIP OF WATER none   May take these medicines IF NEEDED: albuterol (PROVENTIL HFA;VENTOLIN HFA) 108 (90 BASE) MCG/ACT inhaler  hydrOXYzine (VISTARIL)   One week prior to surgery, STOP taking any Aspirin (unless otherwise instructed by your surgeon) Aleve, Naproxen, Ibuprofen, Motrin, Advil, Goody's, BC's, all herbal medications, fish oil, and non-prescription vitamins, including Aspirin-Acetaminophen-Caffeine (GOODY HEADACHE PO)                      Do NOT Smoke (Tobacco/Vaping) for 24 hours prior to your procedure.  If you use a CPAP at night, you may bring your mask/headgear for your overnight stay.   You will be asked to remove any contacts, glasses, piercing's, hearing aid's, dentures/partials prior to surgery. Please bring cases for these items if needed.    Patients discharged the day of surgery will not be allowed to drive home, and someone needs to stay with them for 24 hours.  SURGICAL WAITING ROOM VISITATION Patients may have no more than 2 support people in the waiting area - these visitors  may rotate.   Pre-op nurse will coordinate an appropriate time for 1 ADULT support person, who may not rotate, to accompany patient in pre-op.  Children under the age of 100 must have an adult with them who is not the patient and must remain in the main waiting area with an adult.  If the patient needs to stay at the hospital during part of their recovery, the visitor guidelines for inpatient rooms apply.  Please refer to the [facility] website for the visitor guidelines for any additional information.   If you received a COVID test during your pre-op visit  it is requested that you wear a mask when out in public, stay away from anyone that may not be feeling well and notify your surgeon if you develop symptoms. If you have been in contact with anyone that has tested positive in the last 10 days please notify you surgeon.      Pre-operative CHG Bathing Instructions   You can play a key role in reducing the risk of infection after surgery. Your skin needs to be as free of germs as possible. You can reduce the number of germs on your skin by washing with CHG (chlorhexidine gluconate) soap before surgery. CHG is an antiseptic soap that kills germs and continues to kill germs even after washing.   DO NOT use if you have an allergy to chlorhexidine/CHG or antibacterial soaps. If your skin becomes reddened or irritated, stop using the CHG and notify one of our RNs at [phone].  TAKE A SHOWER THE NIGHT BEFORE SURGERY AND THE DAY OF SURGERY    Please keep in mind the following:  DO NOT shave, including legs and underarms, 48 hours prior to surgery.   You may shave your face before/day of surgery.  Place clean sheets on your bed the night before surgery Use a clean washcloth (not used since being washed) for each shower. DO NOT sleep with pet's night before surgery.  CHG Shower Instructions:  Wash your face and private area with normal soap. If you choose to wash your hair, wash  first with your normal shampoo.  After you use shampoo/soap, rinse your hair and body thoroughly to remove shampoo/soap residue.  Turn the water OFF and apply half the bottle of CHG soap to a CLEAN washcloth.  Apply CHG soap ONLY FROM YOUR NECK DOWN TO YOUR TOES (washing for 3-5 minutes)  DO NOT use CHG soap on face, private areas, open wounds, or sores.  Pay special attention to the area where your surgery is being performed.  If you are having back surgery, having someone wash your back for you may be helpful. Wait 2 minutes after CHG soap is applied, then you may rinse off the CHG soap.  Pat dry with a clean towel  Put on clean pajamas    Additional instructions for the day of surgery: DO NOT APPLY any lotions, deodorants, cologne, or perfumes.   Do not wear jewelry or makeup Do not wear nail polish, gel polish, artificial nails, or any other type of covering on natural nails (fingers and toes) Do not bring valuables to the hospital. [facility] is not responsible for valuables/personal belongings. Put on clean/comfortable clothes.  Please brush your teeth.  Ask your nurse before applying any prescription medications to the skin.

## 2023-09-28 ENCOUNTER — Other Ambulatory Visit: Payer: Self-pay

## 2023-09-28 ENCOUNTER — Encounter (HOSPITAL_COMMUNITY): Payer: Self-pay

## 2023-09-28 ENCOUNTER — Encounter (HOSPITAL_COMMUNITY)
Admission: RE | Admit: 2023-09-28 | Discharge: 2023-09-28 | Disposition: A | Discharge status: Home / Self Care | Payer: BC Managed Care – PPO | Source: Ambulatory Visit | Attending: Orthopedic Surgery | Admitting: Orthopedic Surgery

## 2023-09-28 VITALS — BP 146/82 | HR 88 | Temp 99.0°F | Resp 20 | Ht 63.0 in | Wt 330.0 lb

## 2023-09-28 DIAGNOSIS — F17210 Nicotine dependence, cigarettes, uncomplicated: Secondary | ICD-10-CM | POA: Diagnosis not present

## 2023-09-28 DIAGNOSIS — R519 Headache, unspecified: Secondary | ICD-10-CM | POA: Insufficient documentation

## 2023-09-28 DIAGNOSIS — Z01812 Encounter for preprocedural laboratory examination: Secondary | ICD-10-CM | POA: Insufficient documentation

## 2023-09-28 DIAGNOSIS — E669 Obesity, unspecified: Secondary | ICD-10-CM | POA: Diagnosis not present

## 2023-09-28 DIAGNOSIS — J45909 Unspecified asthma, uncomplicated: Secondary | ICD-10-CM | POA: Diagnosis not present

## 2023-09-28 DIAGNOSIS — M2342 Loose body in knee, left knee: Secondary | ICD-10-CM | POA: Diagnosis not present

## 2023-09-28 DIAGNOSIS — X58XXXA Exposure to other specified factors, initial encounter: Secondary | ICD-10-CM | POA: Diagnosis not present

## 2023-09-28 DIAGNOSIS — S83282A Other tear of lateral meniscus, current injury, left knee, initial encounter: Secondary | ICD-10-CM | POA: Diagnosis not present

## 2023-09-28 DIAGNOSIS — Z01818 Encounter for other preprocedural examination: Secondary | ICD-10-CM

## 2023-09-28 DIAGNOSIS — H5461 Unqualified visual loss, right eye, normal vision left eye: Secondary | ICD-10-CM | POA: Insufficient documentation

## 2023-09-28 DIAGNOSIS — Z6841 Body Mass Index (BMI) 40.0 and over, adult: Secondary | ICD-10-CM | POA: Diagnosis not present

## 2023-09-28 DIAGNOSIS — S83242A Other tear of medial meniscus, current injury, left knee, initial encounter: Secondary | ICD-10-CM | POA: Diagnosis present

## 2023-09-28 DIAGNOSIS — M2242 Chondromalacia patellae, left knee: Secondary | ICD-10-CM | POA: Diagnosis not present

## 2023-09-28 LAB — CBC
HCT: 37.2 % (ref 36.0–46.0)
Hemoglobin: 11.9 g/dL — ABNORMAL LOW (ref 12.0–15.0)
MCH: 27.9 pg (ref 26.0–34.0)
MCHC: 32 g/dL (ref 30.0–36.0)
MCV: 87.3 fL (ref 80.0–100.0)
Platelets: 241 10*3/uL (ref 150–400)
RBC: 4.26 MIL/uL (ref 3.87–5.11)
RDW: 14.6 % (ref 11.5–15.5)
WBC: 7.1 10*3/uL (ref 4.0–10.5)
nRBC: 0 % (ref 0.0–0.2)

## 2023-09-28 LAB — SURGICAL PCR SCREEN
MRSA, PCR: NEGATIVE
Staphylococcus aureus: NEGATIVE

## 2023-09-28 NOTE — Progress Notes (Signed)
PCP - Renaye Rakers, MD Cardiologist - denies  PPM/ICD - denies Device Orders - n/a Rep Notified - n/a  Chest x-ray - denies EKG - 2020 Stress Test - denies ECHO - denies Cardiac Cath - denies  Sleep Study - denies CPAP - n/a  Fasting Blood Sugar - no DM Checks Blood Sugar _____ times a day  Last dose of GLP1 agonist-  n/a GLP1 instructions: n/a  Blood Thinner Instructions: n/a Aspirin Instructions: n/a  ERAS Protcol - yes, till 0645 am  PRE-SURGERY Ensure or G2- no  COVID TEST- n/a   Anesthesia review: yes, Fayrene Fearing PA assessing the pt.   Patient denies shortness of breath, fever, cough and chest pain at PAT appointment. Pt sounds congested and she has runny nose. Per pt she has asthma and she wakes up congested every morning and by 12 pm it resolves. Per pt she uses inhaler and Flonase as needed. Pt is instructed to notify if she feels worse tmrw. James PA to follow up. Last office visit note from pt's PCP is requested.    All instructions explained to the patient, with a verbal understanding of the material. Patient agrees to go over the instructions while at home for a better understanding. Patient also instructed to self quarantine after being tested for COVID-19. The opportunity to ask questions was provided.

## 2023-09-29 NOTE — Anesthesia Preprocedure Evaluation (Signed)
Anesthesia Evaluation  Patient identified by MRN, date of birth, ID band Patient awake    Reviewed: Allergy & Precautions, H&P , NPO status , Patient's Chart, lab work & pertinent test results  Airway Mallampati: III  TM Distance: >3 FB Neck ROM: Full    Dental no notable dental hx.    Pulmonary asthma (albuterol every day the last few days, seasonal use) , Current Smoker (1/2 ppd) and Patient abstained from smoking. Snores at night, no witnessed apneas, no sleep study    Pulmonary exam normal breath sounds clear to auscultation       Cardiovascular negative cardio ROS Normal cardiovascular exam Rhythm:Regular Rate:Normal     Neuro/Psych  Headaches  negative psych ROS   GI/Hepatic negative GI ROS,,,(+)     substance abuse (marijuana twice per week)  marijuana use  Endo/Other    Class 4 obesityBMI 58  Renal/GU negative Renal ROS  negative genitourinary   Musculoskeletal L knee meniscal tear   Abdominal  (+) + obese  Peds negative pediatric ROS (+)  Hematology Hb 11.9   Anesthesia Other Findings   Reproductive/Obstetrics negative OB ROS                             Anesthesia Physical Anesthesia Plan  ASA: 3  Anesthesia Plan: General   Post-op Pain Management: Tylenol PO (pre-op)* and Toradol IV (intra-op)*   Induction: Intravenous  PONV Risk Score and Plan: 2 and Ondansetron, Dexamethasone, Midazolam and Treatment may vary due to age or medical condition  Airway Management Planned: LMA  Additional Equipment: None  Intra-op Plan:   Post-operative Plan: Extubation in OR  Informed Consent: I have reviewed the patients History and Physical, chart, labs and discussed the procedure including the risks, benefits and alternatives for the proposed anesthesia with the patient or authorized representative who has indicated his/her understanding and acceptance.     Dental advisory  given  Plan Discussed with: CRNA  Anesthesia Plan Comments: (  )        Anesthesia Quick Evaluation

## 2023-09-29 NOTE — Progress Notes (Addendum)
Anesthesia Chart Review:  47 year old female with pertinent history including current everyday smoker, asthma, seasonal rhinitis, headaches, blindness in right eye.  I evaluated patient at her preadmission testing appointment on 09/28/2023 due to report of sinus congestion.  She reports several days of sinus congestion and rhinitis.  She reports a similar history of seasonal allergies/rhinitis in the spring and winter.  Per records in Care Everywhere, she last had a flareup of allergy/asthma in April 2024 which required steroids at that time.  Patient states her current symptoms are nowhere near as severe as that.  She says she does not feel ill and says that typically her sinus congestion clears up throughout the day.  She does not have any significant cough; she does have underlying asthma and occasionally uses her albuterol inhaler.  She said she used it once this morning but otherwise does not use it frequently.  She also says she just traveled back and forth from [place] in the span of one day and attributes some of her symptoms to flying and change in climate.  On exam she is well-appearing, in no acute distress, breathing unlabored.  Auscultation reveals lung sounds to be clear but distant secondary to body habitus.  Heart is regular rate and rhythm, no M/R/G.  Exam today is overall fairly benign, however I advised I will call her tomorrow to follow-up to make sure there has been no change in symptoms.  I did call and speak with the patient on 09/25/2023.  She reports overall feeling well, has not needed to use her albuterol inhaler today, says that her sinus congestion is improved.  She did not have any coughing or shortness of breath during our conversation. Anticipate she can proceed as planned barring acute status change.   Preop labs reviewed, unremarkable.     Zannie Cove [facility] Short Stay Center/Anesthesiology Phone [phone] 09/29/2023 3:55 PM

## 2023-09-30 ENCOUNTER — Ambulatory Visit (HOSPITAL_COMMUNITY): Payer: Self-pay | Admitting: Physician Assistant

## 2023-09-30 ENCOUNTER — Encounter (HOSPITAL_COMMUNITY): Payer: Self-pay | Admitting: Orthopedic Surgery

## 2023-09-30 ENCOUNTER — Other Ambulatory Visit: Payer: Self-pay

## 2023-09-30 ENCOUNTER — Ambulatory Visit (HOSPITAL_COMMUNITY): Payer: BC Managed Care – PPO

## 2023-09-30 ENCOUNTER — Encounter (HOSPITAL_COMMUNITY)
Admission: RE | Disposition: A | Discharge status: Home / Self Care | Payer: Self-pay | Source: Ambulatory Visit | Attending: Orthopedic Surgery

## 2023-09-30 ENCOUNTER — Ambulatory Visit (HOSPITAL_COMMUNITY)
Admission: RE | Admit: 2023-09-30 | Discharge: 2023-09-30 | Disposition: A | Discharge status: Home / Self Care | Payer: BC Managed Care – PPO | Source: Ambulatory Visit | Attending: Orthopedic Surgery | Admitting: Orthopedic Surgery

## 2023-09-30 DIAGNOSIS — M2342 Loose body in knee, left knee: Secondary | ICD-10-CM

## 2023-09-30 DIAGNOSIS — E669 Obesity, unspecified: Secondary | ICD-10-CM | POA: Insufficient documentation

## 2023-09-30 DIAGNOSIS — S83242D Other tear of medial meniscus, current injury, left knee, subsequent encounter: Secondary | ICD-10-CM | POA: Diagnosis not present

## 2023-09-30 DIAGNOSIS — Z6841 Body Mass Index (BMI) 40.0 and over, adult: Secondary | ICD-10-CM | POA: Insufficient documentation

## 2023-09-30 DIAGNOSIS — X58XXXA Exposure to other specified factors, initial encounter: Secondary | ICD-10-CM | POA: Insufficient documentation

## 2023-09-30 DIAGNOSIS — F17210 Nicotine dependence, cigarettes, uncomplicated: Secondary | ICD-10-CM | POA: Insufficient documentation

## 2023-09-30 DIAGNOSIS — M2242 Chondromalacia patellae, left knee: Secondary | ICD-10-CM | POA: Insufficient documentation

## 2023-09-30 DIAGNOSIS — S83282D Other tear of lateral meniscus, current injury, left knee, subsequent encounter: Secondary | ICD-10-CM

## 2023-09-30 DIAGNOSIS — S83282A Other tear of lateral meniscus, current injury, left knee, initial encounter: Secondary | ICD-10-CM

## 2023-09-30 DIAGNOSIS — S83242A Other tear of medial meniscus, current injury, left knee, initial encounter: Secondary | ICD-10-CM

## 2023-09-30 DIAGNOSIS — J45909 Unspecified asthma, uncomplicated: Secondary | ICD-10-CM | POA: Insufficient documentation

## 2023-09-30 HISTORY — PX: KNEE ARTHROSCOPY WITH MEDIAL MENISECTOMY: SHX5651

## 2023-09-30 SURGERY — ARTHROSCOPY, KNEE, WITH MEDIAL MENISCECTOMY
Anesthesia: General | Site: Knee | Laterality: Left

## 2023-09-30 MED ORDER — EPINEPHRINE PF 1 MG/ML IJ SOLN
INTRAMUSCULAR | Status: AC
  Filled 2023-09-30: qty 1

## 2023-09-30 MED ORDER — MIDAZOLAM HCL 2 MG/2ML IJ SOLN
INTRAMUSCULAR | Status: DC | PRN
  Administered 2023-09-30: 2 mg via INTRAVENOUS

## 2023-09-30 MED ORDER — LIDOCAINE 2% (20 MG/ML) 5 ML SYRINGE
INTRAMUSCULAR | Status: DC | PRN
  Administered 2023-09-30: 60 mg via INTRAVENOUS

## 2023-09-30 MED ORDER — ONDANSETRON HCL 4 MG/2ML IJ SOLN
INTRAMUSCULAR | Status: DC | PRN
  Administered 2023-09-30: 4 mg via INTRAVENOUS

## 2023-09-30 MED ORDER — ONDANSETRON HCL 4 MG/2ML IJ SOLN: 4.0000 mg | Freq: Once | INTRAMUSCULAR | Status: DC | PRN

## 2023-09-30 MED ORDER — ACETAMINOPHEN 500 MG PO TABS
1000.0000 mg | ORAL_TABLET | Freq: Once | ORAL | Status: AC
  Administered 2023-09-30: 1000 mg via ORAL
  Filled 2023-09-30: qty 2

## 2023-09-30 MED ORDER — DEXAMETHASONE SODIUM PHOSPHATE 10 MG/ML IJ SOLN
INTRAMUSCULAR | Status: DC | PRN
  Administered 2023-09-30: 10 mg via INTRAVENOUS

## 2023-09-30 MED ORDER — FENTANYL CITRATE (PF) 250 MCG/5ML IJ SOLN
INTRAMUSCULAR | Status: DC | PRN
  Administered 2023-09-30: 50 ug via INTRAVENOUS
  Administered 2023-09-30: 100 ug via INTRAVENOUS

## 2023-09-30 MED ORDER — SODIUM CHLORIDE 0.9 % IR SOLN
Status: DC | PRN
  Administered 2023-09-30: 1000 mL

## 2023-09-30 MED ORDER — PHENYLEPHRINE 80 MCG/ML (10ML) SYRINGE FOR IV PUSH (FOR BLOOD PRESSURE SUPPORT)
PREFILLED_SYRINGE | INTRAVENOUS | Status: DC | PRN
  Administered 2023-09-30: 80 ug via INTRAVENOUS

## 2023-09-30 MED ORDER — OXYCODONE HCL 5 MG PO TABS
5.0000 mg | ORAL_TABLET | ORAL | 0 refills | Status: DC | PRN
Start: 2023-09-30 — End: 2023-10-15

## 2023-09-30 MED ORDER — LIDOCAINE 2% (20 MG/ML) 5 ML SYRINGE
INTRAMUSCULAR | Status: AC
  Filled 2023-09-30: qty 5

## 2023-09-30 MED ORDER — MIDAZOLAM HCL 2 MG/2ML IJ SOLN
INTRAMUSCULAR | Status: AC
  Filled 2023-09-30: qty 2

## 2023-09-30 MED ORDER — DEXTROSE 5 % IV SOLN
INTRAVENOUS | Status: DC | PRN
  Administered 2023-09-30: 3 g via INTRAVENOUS

## 2023-09-30 MED ORDER — ALBUTEROL SULFATE HFA 108 (90 BASE) MCG/ACT IN AERS
INHALATION_SPRAY | RESPIRATORY_TRACT | Status: AC
  Filled 2023-09-30: qty 6.7

## 2023-09-30 MED ORDER — MORPHINE SULFATE (PF) 4 MG/ML IV SOLN
INTRAVENOUS | Status: AC
  Filled 2023-09-30: qty 2

## 2023-09-30 MED ORDER — AMISULPRIDE (ANTIEMETIC) 5 MG/2ML IV SOLN: 10.0000 mg | Freq: Once | INTRAVENOUS | Status: DC | PRN

## 2023-09-30 MED ORDER — LACTATED RINGERS IV SOLN: INTRAVENOUS | Status: DC

## 2023-09-30 MED ORDER — MEPERIDINE HCL 25 MG/ML IJ SOLN: 6.2500 mg | INTRAMUSCULAR | Status: DC | PRN

## 2023-09-30 MED ORDER — BUPIVACAINE-EPINEPHRINE (PF) 0.25% -1:200000 IJ SOLN
INTRAMUSCULAR | Status: DC | PRN
Start: 2023-09-30 — End: 2023-09-30
  Administered 2023-09-30: 10 mL

## 2023-09-30 MED ORDER — RIVAROXABAN 10 MG PO TABS: 10.0000 mg | ORAL_TABLET | Freq: Every day | ORAL | 0 refills | Status: DC

## 2023-09-30 MED ORDER — OXYCODONE HCL 5 MG PO TABS
ORAL_TABLET | ORAL | Status: AC
  Filled 2023-09-30: qty 1

## 2023-09-30 MED ORDER — FENTANYL CITRATE (PF) 250 MCG/5ML IJ SOLN
INTRAMUSCULAR | Status: AC
  Filled 2023-09-30: qty 5

## 2023-09-30 MED ORDER — HYDROMORPHONE HCL 1 MG/ML IJ SOLN
INTRAMUSCULAR | Status: AC
  Filled 2023-09-30: qty 1

## 2023-09-30 MED ORDER — SODIUM CHLORIDE 0.9 % IR SOLN
Status: DC | PRN
  Administered 2023-09-30 (×2): 3001 mL

## 2023-09-30 MED ORDER — KETOROLAC TROMETHAMINE 30 MG/ML IJ SOLN
30.0000 mg | Freq: Once | INTRAMUSCULAR | Status: AC | PRN
  Administered 2023-09-30: 30 mg via INTRAVENOUS

## 2023-09-30 MED ORDER — PROPOFOL 10 MG/ML IV BOLUS
INTRAVENOUS | Status: DC | PRN
  Administered 2023-09-30 (×2): 50 mg via INTRAVENOUS
  Administered 2023-09-30: 200 mg via INTRAVENOUS

## 2023-09-30 MED ORDER — SODIUM CHLORIDE 0.9 % IV SOLN: INTRAVENOUS | Status: DC

## 2023-09-30 MED ORDER — CHLORHEXIDINE GLUCONATE 0.12 % MT SOLN
15.0000 mL | Freq: Once | OROMUCOSAL | Status: AC
  Administered 2023-09-30: 15 mL via OROMUCOSAL
  Filled 2023-09-30: qty 15

## 2023-09-30 MED ORDER — OXYCODONE HCL 5 MG PO TABS
5.0000 mg | ORAL_TABLET | Freq: Once | ORAL | Status: AC | PRN
  Administered 2023-09-30: 5 mg via ORAL

## 2023-09-30 MED ORDER — HYDROMORPHONE HCL 1 MG/ML IJ SOLN
0.2500 mg | INTRAMUSCULAR | Status: DC | PRN
  Administered 2023-09-30 (×2): 0.5 mg via INTRAVENOUS

## 2023-09-30 MED ORDER — ALBUTEROL SULFATE HFA 108 (90 BASE) MCG/ACT IN AERS
INHALATION_SPRAY | RESPIRATORY_TRACT | Status: DC | PRN
  Administered 2023-09-30: 6 via RESPIRATORY_TRACT

## 2023-09-30 MED ORDER — METHOCARBAMOL 500 MG PO TABS: 500.0000 mg | ORAL_TABLET | Freq: Three times a day (TID) | ORAL | 1 refills | Status: DC | PRN

## 2023-09-30 MED ORDER — KETOROLAC TROMETHAMINE 30 MG/ML IJ SOLN
INTRAMUSCULAR | Status: AC
  Filled 2023-09-30: qty 1

## 2023-09-30 MED ORDER — OXYCODONE HCL 5 MG/5ML PO SOLN: 5.0000 mg | Freq: Once | ORAL | Status: AC | PRN

## 2023-09-30 MED ORDER — PROPOFOL 10 MG/ML IV BOLUS
INTRAVENOUS | Status: AC
  Filled 2023-09-30: qty 20

## 2023-09-30 MED ORDER — DEXAMETHASONE SODIUM PHOSPHATE 10 MG/ML IJ SOLN
INTRAMUSCULAR | Status: AC
  Filled 2023-09-30: qty 1

## 2023-09-30 MED ORDER — ORAL CARE MOUTH RINSE: 15.0000 mL | Freq: Once | OROMUCOSAL | Status: AC

## 2023-09-30 MED ORDER — BUPIVACAINE-EPINEPHRINE (PF) 0.25% -1:200000 IJ SOLN
INTRAMUSCULAR | Status: AC
  Filled 2023-09-30: qty 30

## 2023-09-30 MED ORDER — ONDANSETRON HCL 4 MG/2ML IJ SOLN
INTRAMUSCULAR | Status: AC
  Filled 2023-09-30: qty 2

## 2023-09-30 MED ORDER — MORPHINE SULFATE 4 MG/ML IJ SOLN
INTRAMUSCULAR | Status: DC | PRN
  Administered 2023-09-30: 23 mL

## 2023-09-30 SURGICAL SUPPLY — 52 items
ALCOHOL 70% 16 OZ (MISCELLANEOUS) ×2 IMPLANT
BAG COUNTER SPONGE SURGICOUNT (BAG) ×2 IMPLANT
BANDAGE ESMARK 6X9 LF (GAUZE/BANDAGES/DRESSINGS) IMPLANT
BLADE CLIPPER SURG (BLADE) IMPLANT
BLADE CUTTER GATOR 3.5 (BLADE) ×2 IMPLANT
BLADE EXCALIBUR 4.0X13 (MISCELLANEOUS) IMPLANT
BLADE SHAVER TORPEDO 4X13 (MISCELLANEOUS) IMPLANT
BNDG ELASTIC 6X5.8 VLCR STR LF (GAUZE/BANDAGES/DRESSINGS) IMPLANT
BNDG ESMARK 6X9 LF (GAUZE/BANDAGES/DRESSINGS) IMPLANT
COVER SURGICAL LIGHT HANDLE (MISCELLANEOUS) ×2 IMPLANT
CUFF TOURN SGL QUICK 42 (TOURNIQUET CUFF) IMPLANT
CUFF TRNQT CYL 34X4.125X (TOURNIQUET CUFF) IMPLANT
DRAPE ARTHROSCOPY W/POUCH 114 (DRAPES) ×2 IMPLANT
DRAPE HALF SHEET 40X57 (DRAPES) IMPLANT
DRAPE INCISE IOBAN 66X45 STRL (DRAPES) IMPLANT
DRAPE U-SHAPE 47X51 STRL (DRAPES) ×2 IMPLANT
DRSG TEGADERM 4X4.75 (GAUZE/BANDAGES/DRESSINGS) IMPLANT
DURAPREP 26ML APPLICATOR (WOUND CARE) ×2 IMPLANT
DW OUTFLOW CASSETTE/TUBE SET (MISCELLANEOUS) IMPLANT
GAUZE SPONGE 4X4 12PLY STRL (GAUZE/BANDAGES/DRESSINGS) IMPLANT
GAUZE XEROFORM 1X8 LF (GAUZE/BANDAGES/DRESSINGS) IMPLANT
GLOVE BIOGEL PI IND STRL 7.0 (GLOVE) ×2 IMPLANT
GLOVE BIOGEL PI IND STRL 8 (GLOVE) ×2 IMPLANT
GLOVE ECLIPSE 7.0 STRL STRAW (GLOVE) ×2 IMPLANT
GLOVE ECLIPSE 8.0 STRL XLNG CF (GLOVE) ×2 IMPLANT
GOWN STRL REUS W/ TWL LRG LVL3 (GOWN DISPOSABLE) ×4 IMPLANT
GOWN STRL REUS W/ TWL XL LVL3 (GOWN DISPOSABLE) ×2 IMPLANT
KIT BASIN OR (CUSTOM PROCEDURE TRAY) ×2 IMPLANT
KIT TURNOVER KIT B (KITS) ×2 IMPLANT
MANIFOLD NEPTUNE II (INSTRUMENTS) IMPLANT
NDL 18GX1X1/2 (RX/OR ONLY) (NEEDLE) IMPLANT
NDL 22X1.5 STRL (OR ONLY) (MISCELLANEOUS) ×2 IMPLANT
NEEDLE 18GX1X1/2 (RX/OR ONLY) (NEEDLE) IMPLANT
NEEDLE 22X1.5 STRL (OR ONLY) (MISCELLANEOUS) ×1 IMPLANT
NS IRRIG 1000ML POUR BTL (IV SOLUTION) IMPLANT
PACK ARTHROSCOPY DSU (CUSTOM PROCEDURE TRAY) ×2 IMPLANT
PAD ARMBOARD 7.5X6 YLW CONV (MISCELLANEOUS) ×4 IMPLANT
PADDING CAST COTTON 6X4 STRL (CAST SUPPLIES) ×2 IMPLANT
PORT APPOLLO RF 90DEGREE MULTI (SURGICAL WAND) ×2 IMPLANT
SOL PREP POV-IOD 4OZ 10% (MISCELLANEOUS) ×2 IMPLANT
SPONGE T-LAP 4X18 ~~LOC~~+RFID (SPONGE) ×2 IMPLANT
SUT ETHILON 3 0 PS 1 (SUTURE) IMPLANT
SUT MENISCAL KIT (KITS) IMPLANT
SYR 20ML ECCENTRIC (SYRINGE) ×2 IMPLANT
SYR CONTROL 10ML LL (SYRINGE) IMPLANT
SYR TB 1ML LUER SLIP (SYRINGE) ×2 IMPLANT
TOWEL GREEN STERILE (TOWEL DISPOSABLE) ×2 IMPLANT
TOWEL GREEN STERILE FF (TOWEL DISPOSABLE) ×2 IMPLANT
TUBE CONNECTING 12X1/4 (SUCTIONS) ×2 IMPLANT
TUBING ARTHROSCOPY IRRIG 16FT (MISCELLANEOUS) ×2 IMPLANT
WAND 90 DEG TURBOVAC W/CORD (SURGICAL WAND) IMPLANT
WATER STERILE IRR 1000ML POUR (IV SOLUTION) ×2 IMPLANT

## 2023-09-30 NOTE — Anesthesia Procedure Notes (Signed)
Procedure Name: LMA Insertion Date/Time: 09/30/2023 10:01 AM  Performed by: Georgianne Fick D, CRNAPre-anesthesia Checklist: Patient identified, Emergency Drugs available, Suction available and Patient being monitored Patient Re-evaluated:Patient Re-evaluated prior to induction Oxygen Delivery Method: Circle System Utilized Preoxygenation: Pre-oxygenation with 100% oxygen Induction Type: IV induction Ventilation: Mask ventilation without difficulty LMA: LMA inserted LMA Size: 5.0 Number of attempts: 2 (LMA4 seated but unable to generate adequate TV without excessive pressure) Airway Equipment and Method: Bite block Placement Confirmation: positive ETCO2 Tube secured with: Tape Dental Injury: Teeth and Oropharynx as per pre-operative assessment

## 2023-09-30 NOTE — Brief Op Note (Signed)
   09/30/2023  10:42 AM  PATIENT:  Madison Welch  47 y.o. female  PRE-OPERATIVE DIAGNOSIS:  left knee meniscal tear  POST-OPERATIVE DIAGNOSIS:  left knee meniscal tear,lateral meniscal tear,loose body  PROCEDURE:  Procedure(s): LEFT KNEE ARTHROSCOPY WITH MENISCAL DEBRIDEMENT  SURGEON:  Surgeon(s): August Saucer Corrie Mckusick, MD  ASSISTANT: magnant pa  ANESTHESIA:   general  EBL: 5 ml    No intake/output data recorded.  BLOOD ADMINISTERED: none  DRAINS: none   LOCAL MEDICATIONS USED: Marcaine morphine clonidine none  SPECIMEN:  No Specimen  COUNTS:  YES  TOURNIQUET:  * No tourniquets in log *  DICTATION: .Other Dictation: Dictation Number [id number]  PLAN OF CARE: Discharge to home after PACU  PATIENT DISPOSITION:  PACU - hemodynamically stable

## 2023-09-30 NOTE — Op Note (Signed)
Madison Welch, Madison Welch MEDICAL RECORD NO: [id number] ACCOUNT NO: [id number] DATE OF BIRTH: Mar 06, 1976 FACILITY: [facility] LOCATION: [facility] PHYSICIAN: Graylin Shiver. August Saucer, MD  Operative Report   DATE OF PROCEDURE: 09/30/2023  PREOPERATIVE DIAGNOSIS:  Left knee medial meniscal tear.  POSTOPERATIVE DIAGNOSES: 1.  Left knee medial meniscal tear. 2.  Lateral meniscal tear. 3.  Loose body.  PROCEDURE:  Left knee arthroscopy with partial medial and lateral meniscectomy and loose body removal from the superior patellar pouch, lateral gutter region.  SURGEON:  Graylin Shiver. August Saucer, MD  ASSISTANT:  Karenann Cai, PA  INDICATIONS:  This is a 47 year old patient with significant arthritis and mechanical symptoms who has increased BMI for knee replacement and who presents for operative management of posterior horn medial meniscal tear after explanation of risks and  benefits.  DESCRIPTION OF PROCEDURE:  The patient was brought to the operating room where a general anesthetic was induced.  Preoperative antibiotics administered.  Timeout was called.  Left leg prescrubbed with alcohol and Betadine allowed to air dry.  Prepped  with DuraPrep solution and draped in a sterile manner.  Preop examination under anesthesia demonstrated full range of motion with stable collateral and cruciate ligaments as well as no posterolateral rotatory instability.  Anterior, inferolateral, and  anterior inferomedial portals were established.  Diagnostic arthroscopy was performed.  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  Tear of the posterior horn  meniscus, radial type tear involving about 50% anterior-posterior depth of the meniscus was encountered and that was debrided back to a stable rim using a combination of basket punch and shavers.  Loose chondral flaps from the medial femoral condyle  were also debrided with a smooth shaver.   Tibial plateau relatively spared.  ACL and PCL intact.  Lateral compartment had some early grade I-II chondromalacia on 20% of the weightbearing surface area of the lateral femoral condyle.  Radial tear involving  about 30% depth of that lateral meniscus was present, which was debrided using basket punch and shaver.  Loose body present in the superior compartment, which was removed.  Loose body measured about 6 x 7 mm.  There was also a meniscal cyst at the  anterior aspect of the lateral horn attachment.  This was decompressed with a shaver.  Following these debridements and removal of the loose body, the knee joint was thoroughly irrigated.  The patellofemoral compartment did have also chondromalacia wear  grade III to IV over about half of the landing zone area.  Thorough irrigation was performed and the instruments were removed.  The portals were closed using 3-0 nylon.  A solution of Marcaine, morphine, and clonidine injected into the knee for postop  pain relief and impervious dressing and Ace wrap was applied.  The patient tolerated the procedure well without immediate complications.  Luke's assistance was required to hold the leg and position the leg as the patient's BMI was 58.  The patient  tolerated it well and was transferred to the recovery room in stable condition.  Plan on Xarelto for DVT prophylaxis with family history of DVT and pulmonary embolism.   PUS D: 09/30/2023 10:49:09 am T: 09/30/2023 11:05:00 am  JOB: [id number]/ [id number]

## 2023-09-30 NOTE — H&P (Signed)
Madison Welch is an 47 y.o. female.   Chief Complaint: Left knee pain HPI: Madison Welch is a 47 y.o. female who presents  reporting left knee pain.  Patient is here to discuss left knee surgery after phone discussion on 07/29/2023.  She reports continued knee pain since initial injury about 5 to 6 weeks ago.  Maybe 5% improvement but no more than this.  She is continuing to limp.  Has not been able to return to work due to the amount of pain she is in and has to work remotely.  She lives in a house with a bedroom upstairs.  Insurance denied Monovisc injection.  She would like to proceed with left knee surgery.  She is pursuing weight loss and has reach out to her PCP about weight loss medication.  Does have family history of DVT with mom and sister having history of DVT and her dad passing away from pulmonary embolus though this was while he was undergoing treatment for cancer   Past Medical History:  Diagnosis Date   Asthma    Headache(784.0)    Legally blind    right eye   Obesity     Past Surgical History:  Procedure Laterality Date   ABDOMINAL HYSTERECTOMY     partial   CESAREAN SECTION     CYSTOSCOPY  09/21/2012   Procedure: CYSTOSCOPY;  Surgeon: Dorien Chihuahua. Richardson Dopp, MD;  Location: [facility];  Service: Gynecology;  Laterality: N/A;  with cystotomy repair   EYE SURGERY     LEG SURGERY     ROBOTIC ASSISTED LAPAROSCOPIC LYSIS OF ADHESION  09/21/2012   Procedure: ROBOTIC ASSISTED LAPAROSCOPIC LYSIS OF ADHESION;  Surgeon: Dorien Chihuahua. Richardson Dopp, MD;  Location: [facility];  Service: Gynecology;  Laterality: N/A;   ROBOTIC ASSISTED TOTAL HYSTERECTOMY  09/21/2012   Procedure: ROBOTIC ASSISTED TOTAL HYSTERECTOMY;  Surgeon: Dorien Chihuahua. Richardson Dopp, MD;  Location: [facility];  Service: Gynecology;  Laterality: N/A;   TUBAL LIGATION      Family History  Problem Relation Age of Onset   Lung cancer Father        metastatic to brain   Social History:  reports that she has been smoking cigarettes. She has never used smokeless  tobacco. She reports current alcohol use. She reports current drug use. Frequency: 2.00 times per week. Drug: Marijuana.  Allergies: No Known Allergies  Medications Prior to Admission  Medication Sig Dispense Refill   albuterol (PROVENTIL HFA;VENTOLIN HFA) 108 (90 BASE) MCG/ACT inhaler Inhale 2 puffs into the lungs every 6 (six) hours as needed for wheezing or shortness of breath.      Aspirin-Acetaminophen-Caffeine (GOODY HEADACHE PO) Take 2 Packages by mouth daily.     hydrOXYzine (VISTARIL) 25 MG capsule Take 25 mg by mouth daily as needed for anxiety.     acetaminophen (TYLENOL) 500 MG tablet Take one tablet (500 mg) every 6 hours as neededfor mild pain and two tablets (1000 mg) every 8 hours as needed for moderate pain. Maximum 6 tablets daily. Do not take aleve, advil, ibuprofen, aspirin, goody powders, or similar medications (Patient not taking: Reported on 09/23/2023) 30 tablet 0   celecoxib (CELEBREX) 100 MG capsule Take 1 capsule (100 mg total) by mouth 2 (two) times daily. (Patient not taking: Reported on 09/23/2023) 60 capsule 0   cyclobenzaprine (FLEXERIL) 5 MG tablet Take 1 tablet (5 mg total) by mouth 3 (three) times daily as needed for muscle spasms. (Patient not taking: Reported on 09/23/2023) 20 tablet 0  lidocaine (HM LIDOCAINE PATCH) 4 % Place 1 patch onto the skin daily. (Patient not taking: Reported on 09/23/2023) 20 patch 0    Results for orders placed or performed during the hospital encounter of 09/28/23 (from the past 48 hours)  Surgical pcr screen     Status: None   Collection Time: 09/28/23  9:57 AM   Specimen: Nasal Mucosa; Nasal Swab  Result Value Ref Range   MRSA, PCR NEGATIVE NEGATIVE   Staphylococcus aureus NEGATIVE NEGATIVE    Comment: (NOTE) The Xpert SA Assay (FDA approved for NASAL specimens in patients 30 years of age and older), is one component of a comprehensive surveillance program. It is not intended to diagnose infection nor to guide or monitor  treatment. Performed at [facility], [address], [place], [place] [address]   CBC per protocol     Status: Abnormal   Collection Time: 09/28/23 10:15 AM  Result Value Ref Range   WBC 7.1 4.0 - 10.5 K/uL   RBC 4.26 3.87 - 5.11 MIL/uL   Hemoglobin 11.9 (L) 12.0 - 15.0 g/dL   HCT 60.4 54.0 - 98.1 %   MCV 87.3 80.0 - 100.0 fL   MCH 27.9 26.0 - 34.0 pg   MCHC 32.0 30.0 - 36.0 g/dL   RDW 19.1 47.8 - 29.5 %   Platelets 241 150 - 400 K/uL   nRBC 0.0 0.0 - 0.2 %    Comment: Performed at [facility], [address], [place], [place] [address]   No results found.  Review of Systems  Musculoskeletal:  Positive for arthralgias.  All other systems reviewed and are negative.   Blood pressure 103/82, pulse 73, temperature 98.7 F (37.1 C), temperature source Oral, resp. rate 18, height 5\' 3"  (1.6 m), weight (!) 149.7 kg, last menstrual period 09/16/2012, SpO2 97%. Physical Exam Vitals reviewed.  HENT:     Head: Normocephalic.     Nose: Nose normal.     Mouth/Throat:     Mouth: Mucous membranes are moist.  Eyes:     Pupils: Pupils are equal, round, and reactive to light.  Cardiovascular:     Rate and Rhythm: Normal rate.     Pulses: Normal pulses.  Pulmonary:     Effort: Pulmonary effort is normal.  Abdominal:     General: Abdomen is flat.  Musculoskeletal:     Cervical back: Normal range of motion.  Skin:    General: Skin is warm.     Capillary Refill: Capillary refill takes less than 2 seconds.  Neurological:     General: No focal deficit present.     Mental Status: She is alert.  Psychiatric:        Mood and Affect: Mood normal.      Ortho exam demonstrates left knee with 3 degrees extension and greater than 90 degrees of knee flexion.  No cellulitis or skin changes noted.  Able to perform straight leg raise without extensor lag.  Positive effusion.  Minimal pain with hip range of motion.  Moderate to severe tenderness over the medial joint line.  Mild to  moderate tenderness over the lateral joint line.  Palpable DP pulse of the left lower extremity.  No calf tenderness.  Negative Denna Haggard' sign  Assessment/Plan Patient is a 47 year old female who presents for evaluation of left knee pain. Has history of left knee partial thickness radial tear of the medial meniscus. Toradol injection has not provided any relief. Monovisc injection was not  approved. At this point just like we have talked about in the past, Madison Welch is between a rock and a hard place with her knee pain. She understands that any arthroscopic debridement in the setting of knee arthritis may potentially make her symptoms worse. She would still like to proceed with arthroscopic surgery after we discussed the risks and benefits of the procedure including but not limited to the risk of nerve/blood vessel damage, knee stiffness, infection, medical complications such as DVT/PE, increased knee pain afterward. She understands that if she has increased knee pain afterward the long-term solution for this would be left knee arthroplasty which is not an option for her at her current BMI of around 56. Would likely need around a BMI of 40 before being able to proceed. All patient's questions answered. We will have her follow-up after left knee arthroscopy with meniscal debridement. Anticipate Xarelto postoperatively with her family history of DVT/PE .  Multiple discussions were made with Madison Welch regarding the relatively small chance of improvement from this procedure.  Nonetheless she would like to proceed.  Importance of range of motion and quad strengthening exercises discussed.  Will see how she does with this intervention.  All questions answered.  Burnard Bunting, MD 09/30/2023, 9:23 AM

## 2023-09-30 NOTE — Anesthesia Postprocedure Evaluation (Signed)
Anesthesia Post Note  Patient: Madison Welch  Procedure(s) Performed: LEFT KNEE ARTHROSCOPY WITH MENISCAL DEBRIDEMENT (Left: Knee)     Patient location during evaluation: PACU Anesthesia Type: General Level of consciousness: awake and alert, oriented and patient cooperative Pain management: pain level controlled Vital Signs Assessment: post-procedure vital signs reviewed and stable Respiratory status: spontaneous breathing, nonlabored ventilation and respiratory function stable Cardiovascular status: blood pressure returned to baseline and stable Postop Assessment: no apparent nausea or vomiting Anesthetic complications: no   No notable events documented.  Last Vitals:  Vitals:   09/30/23 1055 09/30/23 1100  BP: (!) 136/93 (!) 135/91  Pulse: 78 75  Resp: (!) 21 16  Temp: (!) 36.1 C   SpO2: 98% 98%    Last Pain:  Vitals:   09/30/23 1055  TempSrc:   PainSc: 0-No pain                 Lannie Fields

## 2023-09-30 NOTE — Transfer of Care (Signed)
Immediate Anesthesia Transfer of Care Note  Patient: Madison Welch  Procedure(s) Performed: LEFT KNEE ARTHROSCOPY WITH MENISCAL DEBRIDEMENT (Left: Knee)  Patient Location: PACU  Anesthesia Type:General  Level of Consciousness: awake, alert , and oriented  Airway & Oxygen Therapy: Patient Spontanous Breathing  Post-op Assessment: Report given to RN and Post -op Vital signs reviewed and stable  Post vital signs: Reviewed and stable  Last Vitals:  Vitals Value Taken Time  BP 136/93 09/30/23 1055  Temp    Pulse 84 09/30/23 1058  Resp 25 09/30/23 1058  SpO2 99 % 09/30/23 1058  Vitals shown include unfiled device data.  Last Pain:  Vitals:   09/30/23 0804  TempSrc: Oral  PainSc:          Complications: No notable events documented.

## 2023-10-01 ENCOUNTER — Encounter: Payer: Self-pay | Admitting: Orthopedic Surgery

## 2023-10-01 ENCOUNTER — Telehealth: Payer: Self-pay

## 2023-10-01 ENCOUNTER — Encounter (HOSPITAL_COMMUNITY): Payer: Self-pay | Admitting: Orthopedic Surgery

## 2023-10-01 NOTE — Telephone Encounter (Signed)
Pt states that she is not able to return to work because she works from home and her office is upstairs and she is not able to get up there. She will need her FMLA paperwork adjusted to reflect this. She is out for another 6 weeks. Please call pt to discuss. She states as it stands right now it looks like she is released to go back to work on 10/04/2023 but she does not have a laptop ( she had to turn that in on her last date of work) and she only has her office at home upstairs and is not able to navigate that.  Cb  [phone]

## 2023-10-01 NOTE — Telephone Encounter (Signed)
Note sent to mychart.

## 2023-10-01 NOTE — Telephone Encounter (Signed)
Addressed in other message to Autumn I think

## 2023-10-01 NOTE — Telephone Encounter (Signed)
Okay for work note from my perspective

## 2023-10-08 ENCOUNTER — Telehealth: Payer: Self-pay

## 2023-10-08 ENCOUNTER — Ambulatory Visit (HOSPITAL_COMMUNITY)
Admission: RE | Admit: 2023-10-08 | Discharge: 2023-10-08 | Disposition: A | Discharge status: Home / Self Care | Payer: BC Managed Care – PPO | Source: Ambulatory Visit | Attending: Orthopedic Surgery | Admitting: Orthopedic Surgery

## 2023-10-08 ENCOUNTER — Ambulatory Visit (INDEPENDENT_AMBULATORY_CARE_PROVIDER_SITE_OTHER): Payer: BC Managed Care – PPO | Admitting: Orthopedic Surgery

## 2023-10-08 ENCOUNTER — Other Ambulatory Visit: Payer: Self-pay

## 2023-10-08 ENCOUNTER — Encounter: Payer: Self-pay | Admitting: Orthopedic Surgery

## 2023-10-08 DIAGNOSIS — Z9889 Other specified postprocedural states: Secondary | ICD-10-CM

## 2023-10-08 NOTE — Telephone Encounter (Signed)
-----   Message from Burnard Bunting sent at 10/08/2023 11:31 AM EST ----- Please follow-up Franky Macho 4 weeks thanks

## 2023-10-08 NOTE — Progress Notes (Signed)
Ok to [employer]

## 2023-10-08 NOTE — Progress Notes (Signed)
I called ok for baby asa every day - dc xarelto  lmom

## 2023-10-08 NOTE — Progress Notes (Signed)
   Post-Op Visit Note   Patient: Madison Welch           Date of Birth: 19-Dec-1975           MRN: [id number] Visit Date: 10/08/2023 PCP: Renaye Rakers, MD   Assessment & Plan:  Chief Complaint:  Chief Complaint  Patient presents with   Left Knee - Routine Post Op    09/30/23 left knee scope with meniscal deb   Visit Diagnoses:  1. S/P left knee arthroscopy     Plan: Madison Welch is a 47 year old patient who is now a week out left knee arthroscopy with meniscal debridement.  She is ambulating with a walker.  On examination she has no calf tenderness but does report some cramping in the calf.  She is on Xarelto because of a history of family DVT.  Went up 4 steps yesterday.  Plan at this time is ultrasound left lower extremity to rule out DVT.  Sutures removed.  Continue with quad strengthening exercises and a nonload bearing weight.  Physical therapy 2-3 times a week for 4 weeks.  Follow-up with Franky Macho in 4 weeks for clinical recheck.  Pictures are reviewed with the patient today.  Follow-Up Instructions: No follow-ups on file.   Orders:  Orders Placed This Encounter  Procedures   VAS [place] LOWER EXTREMITY VENOUS (DVT)   No orders of the defined types were placed in this encounter.   Imaging: No results found.  PMFS History: Patient Active Problem List   Diagnosis Date Noted   Easy bruising 07/14/2019   Morbid obesity with BMI of 50.0-59.9, adult (HCC) 07/14/2019   Menorrhagia 09/22/2012   Dysmenorrhea 09/22/2012   Past Medical History:  Diagnosis Date   Asthma    Headache(784.0)    Legally blind    right eye   Obesity     Family History  Problem Relation Age of Onset   Lung cancer Father        metastatic to brain    Past Surgical History:  Procedure Laterality Date   ABDOMINAL HYSTERECTOMY     partial   CESAREAN SECTION     CYSTOSCOPY  09/21/2012   Procedure: CYSTOSCOPY;  Surgeon: Dorien Chihuahua. Richardson Dopp, MD;  Location: [facility];  Service: Gynecology;  Laterality: N/A;  with  cystotomy repair   EYE SURGERY     KNEE ARTHROSCOPY WITH MEDIAL MENISECTOMY Left 09/30/2023   Procedure: LEFT KNEE ARTHROSCOPY WITH MENISCAL DEBRIDEMENT;  Surgeon: Cammy Copa, MD;  Location: [facility] OR;  Service: Orthopedics;  Laterality: Left;   LEG SURGERY     ROBOTIC ASSISTED LAPAROSCOPIC LYSIS OF ADHESION  09/21/2012   Procedure: ROBOTIC ASSISTED LAPAROSCOPIC LYSIS OF ADHESION;  Surgeon: Dorien Chihuahua. Richardson Dopp, MD;  Location: [facility];  Service: Gynecology;  Laterality: N/A;   ROBOTIC ASSISTED TOTAL HYSTERECTOMY  09/21/2012   Procedure: ROBOTIC ASSISTED TOTAL HYSTERECTOMY;  Surgeon: Dorien Chihuahua. Richardson Dopp, MD;  Location: [facility];  Service: Gynecology;  Laterality: N/A;   TUBAL LIGATION     Social History   Occupational History   Not on file  Tobacco Use   Smoking status: Every Day    Types: Cigarettes   Smokeless tobacco: Never  Vaping Use   Vaping status: Never Used  Substance and Sexual Activity   Alcohol use: Yes   Drug use: Yes    Frequency: 2.0 times per week    Types: Marijuana   Sexual activity: Not on file

## 2023-10-08 NOTE — Telephone Encounter (Signed)
Please make sure patient has 4wk follow up appt per Dr August Saucer

## 2023-10-09 DIAGNOSIS — S83282A Other tear of lateral meniscus, current injury, left knee, initial encounter: Secondary | ICD-10-CM

## 2023-10-09 DIAGNOSIS — S83242A Other tear of medial meniscus, current injury, left knee, initial encounter: Secondary | ICD-10-CM

## 2023-10-09 DIAGNOSIS — M2342 Loose body in knee, left knee: Secondary | ICD-10-CM

## 2023-10-10 NOTE — Progress Notes (Signed)
thx

## 2023-10-11 ENCOUNTER — Ambulatory Visit (INDEPENDENT_AMBULATORY_CARE_PROVIDER_SITE_OTHER): Payer: BC Managed Care – PPO | Admitting: Physical Therapy

## 2023-10-11 ENCOUNTER — Encounter: Payer: Self-pay | Admitting: Physical Therapy

## 2023-10-11 ENCOUNTER — Other Ambulatory Visit: Payer: Self-pay

## 2023-10-11 DIAGNOSIS — M25662 Stiffness of left knee, not elsewhere classified: Secondary | ICD-10-CM | POA: Diagnosis not present

## 2023-10-11 DIAGNOSIS — R262 Difficulty in walking, not elsewhere classified: Secondary | ICD-10-CM | POA: Diagnosis not present

## 2023-10-11 DIAGNOSIS — M25562 Pain in left knee: Secondary | ICD-10-CM

## 2023-10-11 DIAGNOSIS — M6281 Muscle weakness (generalized): Secondary | ICD-10-CM | POA: Diagnosis not present

## 2023-10-11 DIAGNOSIS — R600 Localized edema: Secondary | ICD-10-CM

## 2023-10-11 NOTE — Therapy (Signed)
OUTPATIENT PHYSICAL THERAPY LOWER EXTREMITY EVALUATION   Patient Name: Madison Welch MRN: [id number] DOB:04-Mar-1976, 47 y.o., female Today's Date: 10/11/2023  END OF SESSION:  PT End of Session - 10/11/23 1021     Visit Number 1    Number of Visits 13    Date for PT Re-Evaluation 11/22/23    Authorization Type [employer]    Authorization Time Period 10/11/23 to 11/22/23    PT Start Time 0933    PT Stop Time 1013    PT Time Calculation (min) 40 min    Activity Tolerance Patient tolerated treatment well;Patient limited by pain    Behavior During Therapy [facility] for tasks assessed/performed             Past Medical History:  Diagnosis Date   Asthma    Headache(784.0)    Legally blind    right eye   Obesity    Past Surgical History:  Procedure Laterality Date   ABDOMINAL HYSTERECTOMY     partial   CESAREAN SECTION     CYSTOSCOPY  09/21/2012   Procedure: CYSTOSCOPY;  Surgeon: Dorien Chihuahua. Richardson Dopp, MD;  Location: [facility];  Service: Gynecology;  Laterality: N/A;  with cystotomy repair   EYE SURGERY     KNEE ARTHROSCOPY WITH MEDIAL MENISECTOMY Left 09/30/2023   Procedure: LEFT KNEE ARTHROSCOPY WITH MENISCAL DEBRIDEMENT;  Surgeon: Cammy Copa, MD;  Location: [facility] OR;  Service: Orthopedics;  Laterality: Left;   LEG SURGERY     ROBOTIC ASSISTED LAPAROSCOPIC LYSIS OF ADHESION  09/21/2012   Procedure: ROBOTIC ASSISTED LAPAROSCOPIC LYSIS OF ADHESION;  Surgeon: Dorien Chihuahua. Richardson Dopp, MD;  Location: [facility];  Service: Gynecology;  Laterality: N/A;   ROBOTIC ASSISTED TOTAL HYSTERECTOMY  09/21/2012   Procedure: ROBOTIC ASSISTED TOTAL HYSTERECTOMY;  Surgeon: Dorien Chihuahua. Richardson Dopp, MD;  Location: [facility];  Service: Gynecology;  Laterality: N/A;   TUBAL LIGATION     Patient Active Problem List   Diagnosis Date Noted   Acute lateral meniscus tear of left knee 10/09/2023   Acute medial meniscus tear of left knee 10/09/2023   Loose body in knee, left knee 10/09/2023   Easy bruising 07/14/2019   Morbid obesity with  BMI of 50.0-59.9, adult (HCC) 07/14/2019   Menorrhagia 09/22/2012   Dysmenorrhea 09/22/2012    PCP: Renaye Rakers MD   REFERRING PROVIDER: Cammy Copa, MD  REFERRING DIAG: Diagnosis [phone] (ICD-10-CM) - S/P left knee arthroscopy  THERAPY DIAG:  Acute pain of left knee  Stiffness of left knee, not elsewhere classified  Muscle weakness (generalized)  Difficulty in walking, not elsewhere classified  Localized edema  Rationale for Evaluation and Treatment: Rehabilitation  ONSET DATE: L knee arthroscopy with meniscal debridement 09/30/23  SUBJECTIVE:   SUBJECTIVE STATEMENT: Have been feeling OK since surgery, doing some leg stretches and bends but it feels achey and stiff. Feeling shaky when I get in/out of the shower like the left is weak. Hurt the knee standing up quickly, jumped up and heard something pop. Its been swelling a little bit around the incisions, no drainage or redness that I've noticed from the incision. Knee doesn't feel sturdy yet but I'm trying to wean myself off of the walker.   PERTINENT HISTORY: See above  PAIN:  Are you having pain? Yes: NPRS scale: 6/10 now, at absolute worst can get to 10/10 especially in the middle of the night  Pain location: R knee  Pain description: aching, jabbing, cramping, stiff Aggravating factors: walking/WB especially in first few steps,  transfers  Relieving factors: movement , heat   PRECAUTIONS: PT LLE non load bearing exercises per MD referral, also legally blind R eye per PMH in Epic    RED FLAGS: None   WEIGHT BEARING RESTRICTIONS: No "MD told me to put as much weight as I can bear"   FALLS:  Has patient fallen in last 6 months? No  LIVING ENVIRONMENT: Lives with: lives with their family Lives in: House/apartment Stairs: 1 STE, bedroom is upstairs but can stay on first level  Has following equipment at home: Environmental consultant - 2 wheeled  OCCUPATION: works with Hydrologist in corporate office- computer work,  sedentary job   PLOF: Independent, Independent with basic ADLs, Independent with gait, and Independent with transfers  PATIENT GOALS: get back to favorite hobby (dancing- line dancing, swing, step dancing, etc), walk without limits/pain, be able to dance in cowboy boots by my birthday   NEXT MD VISIT: "MD told me to come back in 4 weeks, haven't set it yet but will try to today on my way out"   OBJECTIVE:  Note: Objective measures were completed at Evaluation unless otherwise noted.  DIAGNOSTIC FINDINGS: Summary:  RIGHT:  - No evidence of common femoral vein obstruction.         LEFT:      - There is no evidence of deep vein thrombosis in the lower extremity.    - No cystic structure found in the popliteal fossa.     PATIENT SURVEYS:  FOTO 50, predicted 77 in 13 visits   COGNITION: Overall cognitive status: Within functional limits for tasks assessed     SENSATION: Not tested  EDEMA:  Appropriate for time since surgery (less than 2 weeks since time of eval)   LOWER EXTREMITY ROM:  Active ROM Right eval Left eval  Hip flexion    Hip extension    Hip abduction    Hip adduction    Hip internal rotation    Hip external rotation    Knee flexion 120* 94*  Knee extension 2* 5*  Ankle dorsiflexion    Ankle plantarflexion    Ankle inversion    Ankle eversion     (Blank rows = not tested)  LOWER EXTREMITY MMT:  MMT Right eval Left eval  Hip flexion 5 3+  Hip extension    Hip abduction 4+ 3  Hip adduction    Hip internal rotation    Hip external rotation    Knee flexion 5 3- pain   Knee extension 5 3 pain   Ankle dorsiflexion 5 5  Ankle plantarflexion    Ankle inversion    Ankle eversion     (Blank rows = not tested)    GAIT: Distance walked: in clinic distances  Assistive device utilized: Environmental consultant - 2 wheeled Level of assistance: Modified independence Comments: antalgic, limited ROM L knee though gait cycle, trendelenburg  TREATMENT DATE:    10/11/23  Exam, care planning, education as below   SAQs 0# 10x3 second holds   L LE  SLRs 0# x10 L LE  HS slides 10x3 second holds L LE  Discussed LAQs (she is already doing these at home, recommended she continue with these) Sidelying hip ABD  L LE 0# x10  HS stretch 1x30 seconds L LE    PATIENT EDUCATION:  Education details: exam findings, HEP, POC, don't force knee through painful activities, early to try to wean RW or attempt multiple steps just yet, time needing to allow healing after surgery along with appropriate PT exercises, appropriate rate of exercise/activity progression post-op, heat vs cold and when/where to use each  Person educated: Patient Education method: Explanation, Demonstration, and Handouts Education comprehension: verbalized understanding, returned demonstration, and needs further education  HOME EXERCISE PROGRAM: Access Code: [email or web address] URL: [email or web address] Date: 10/11/2023 Prepared by: Nedra Hai  Exercises - Supine Short Arc Quad  - 2 x daily - 7 x weekly - 1 sets - 10 reps - 2-3 seconds hold - Active Straight Leg Raise with Quad Set  - 2 x daily - 7 x weekly - 1 sets - 10 reps - Supine Heel Slide  - 2 x daily - 7 x weekly - 2 sets - 10 reps - 3 seconds  hold - Sidelying Hip Abduction  - 2 x daily - 7 x weekly - 1 sets - 10 reps - Seated Long Arc Quad  - 2 x daily - 7 x weekly - 1 sets - 10 reps - 2-3 seconds  hold - Seated Hamstring Stretch  - 2 x daily - 7 x weekly - 1 sets - 2 reps - 30 seconds  hold  ASSESSMENT:  CLINICAL IMPRESSION: Patient is a 47 y.o. F who was seen today for physical therapy evaluation and treatment for Diagnosis Z98.890 (ICD-10-CM) - S/P left knee arthroscopy. Exam as expected and typical without significant complications noted. Should be able to progress well with skilled PT  services.    OBJECTIVE IMPAIRMENTS: Abnormal gait, decreased activity tolerance, decreased coordination, decreased knowledge of use of DME, decreased mobility, difficulty walking, decreased ROM, decreased strength, increased edema, increased fascial restrictions, increased muscle spasms, impaired flexibility, obesity, and pain.   ACTIVITY LIMITATIONS: standing, squatting, sleeping, stairs, transfers, bed mobility, and locomotion level  PARTICIPATION LIMITATIONS: driving, shopping, community activity, occupation, and yard work  PERSONAL FACTORS: Age, Behavior pattern, Education, Fitness, Past/current experiences, and Time since onset of injury/illness/exacerbation are also affecting patient's functional outcome.   REHAB POTENTIAL: Good  CLINICAL DECISION MAKING: Stable/uncomplicated  EVALUATION COMPLEXITY: Low   GOALS: Goals reviewed with patient? No  SHORT TERM GOALS: Target date: 11/01/2023   Will be compliant with appropriate progressive HEP  Baseline: Goal status: INITIAL  2.  Pain to be no more than 6/10 at worst  Baseline:  Goal status: INITIAL  3.  Will be independent with edema management strategies such as ice/elevation Baseline:  Goal status: INITIAL  4.  L knee AROM to be 0* extension, at least 115* flexion  Baseline:  Goal status: INITIAL   LONG TERM GOALS: Target date: 11/22/2023    MMT to have improved by at least one grade all tested groups in L LE  Baseline:  Goal status: INITIAL  2.  Pain to be no more than 3/10 at worst  Baseline:  Goal status: INITIAL  3.  Will be able to ambulate community distances no device no increase from  resting pain levels with WNL gait mechanics  Baseline:  Goal status: INITIAL  4.  Will be able to navigate steps with reciprocal pattern, no increase in pain  Baseline:  Goal status: INITIAL  5.  Will have been able to return to dancing (low impact styles at first) with no increase in pain  Baseline:  Goal status:  INITIAL  6.  FOTO score to be within 5 points of predicted value by time of DC  Baseline:  Goal status: INITIAL   PLAN:  PT FREQUENCY: 2x/week  PT DURATION: 6 weeks  PLANNED INTERVENTIONS: 97164- PT Re-evaluation, 97110-Therapeutic exercises, 97530- Therapeutic activity, 97112- Neuromuscular re-education, 97535- Self Care, 16109- Manual therapy, [id number]- Gait training, [id number]- Aquatic Therapy, 97014- Electrical stimulation (unattended), 97016- Vasopneumatic device, [id number]- Ionotophoresis 4mg /ml Dexamethasone, Taping, Dry Needling, Cryotherapy, and Moist heat  PLAN FOR NEXT SESSION: progress as appropriate, MD recommended NWB exercises at eval   Nedra Hai, PT, DPT 10/11/23 10:24 AM

## 2023-10-14 ENCOUNTER — Ambulatory Visit (INDEPENDENT_AMBULATORY_CARE_PROVIDER_SITE_OTHER): Payer: BC Managed Care – PPO | Admitting: Physical Therapy

## 2023-10-14 ENCOUNTER — Encounter: Payer: Self-pay | Admitting: Physical Therapy

## 2023-10-14 DIAGNOSIS — R600 Localized edema: Secondary | ICD-10-CM

## 2023-10-14 DIAGNOSIS — M6281 Muscle weakness (generalized): Secondary | ICD-10-CM | POA: Diagnosis not present

## 2023-10-14 DIAGNOSIS — M25562 Pain in left knee: Secondary | ICD-10-CM

## 2023-10-14 DIAGNOSIS — M25662 Stiffness of left knee, not elsewhere classified: Secondary | ICD-10-CM | POA: Diagnosis not present

## 2023-10-14 DIAGNOSIS — R262 Difficulty in walking, not elsewhere classified: Secondary | ICD-10-CM | POA: Diagnosis not present

## 2023-10-14 NOTE — Therapy (Signed)
OUTPATIENT PHYSICAL THERAPY LOWER EXTREMITY TREATMENT    Patient Name: Madison Welch MRN: [id number] DOB:[birth year], 47 y.o., female Today's Date: 10/14/2023  END OF SESSION:  PT End of Session - 10/14/23 1012     Visit Number 2    Number of Visits 13    Date for PT Re-Evaluation 11/22/23    Authorization Type BCBS    Authorization Time Period 10/11/23 to 11/22/23    PT Start Time 1015    PT Stop Time 1053    PT Time Calculation (min) 38 min    Activity Tolerance Patient tolerated treatment well;Patient limited by pain    Behavior During Therapy [facility] for tasks assessed/performed              Past Medical History:  Diagnosis Date   Asthma    Headache(784.0)    Legally blind    right eye   Obesity    Past Surgical History:  Procedure Laterality Date   ABDOMINAL HYSTERECTOMY     partial   CESAREAN SECTION     CYSTOSCOPY  09/21/2012   Procedure: CYSTOSCOPY;  Surgeon: Dorien Chihuahua. Richardson Dopp, MD;  Location: [facility];  Service: Gynecology;  Laterality: N/A;  with cystotomy repair   EYE SURGERY     KNEE ARTHROSCOPY WITH MEDIAL MENISECTOMY Left 09/30/2023   Procedure: LEFT KNEE ARTHROSCOPY WITH MENISCAL DEBRIDEMENT;  Surgeon: Cammy Copa, MD;  Location: [facility] OR;  Service: Orthopedics;  Laterality: Left;   LEG SURGERY     ROBOTIC ASSISTED LAPAROSCOPIC LYSIS OF ADHESION  09/21/2012   Procedure: ROBOTIC ASSISTED LAPAROSCOPIC LYSIS OF ADHESION;  Surgeon: Dorien Chihuahua. Richardson Dopp, MD;  Location: [facility];  Service: Gynecology;  Laterality: N/A;   ROBOTIC ASSISTED TOTAL HYSTERECTOMY  09/21/2012   Procedure: ROBOTIC ASSISTED TOTAL HYSTERECTOMY;  Surgeon: Dorien Chihuahua. Richardson Dopp, MD;  Location: [facility];  Service: Gynecology;  Laterality: N/A;   TUBAL LIGATION     Patient Active Problem List   Diagnosis Date Noted   Acute lateral meniscus tear of left knee 10/09/2023   Acute medial meniscus tear of left knee 10/09/2023   Loose body in knee, left knee 10/09/2023   Easy bruising 07/14/2019   Morbid obesity with  BMI of 50.0-59.9, adult (HCC) 07/14/2019   Menorrhagia 09/22/2012   Dysmenorrhea 09/22/2012    PCP: Renaye Rakers MD   REFERRING PROVIDER: Cammy Copa, MD  REFERRING DIAG: Diagnosis [phone] (ICD-10-CM) - S/P left knee arthroscopy  THERAPY DIAG:  Acute pain of left knee  Stiffness of left knee, not elsewhere classified  Muscle weakness (generalized)  Difficulty in walking, not elsewhere classified  Localized edema  Rationale for Evaluation and Treatment: Rehabilitation  ONSET DATE: L knee arthroscopy with meniscal debridement 09/30/23  SUBJECTIVE:   SUBJECTIVE STATEMENT:  Did too much over the holiday, was up and down a lot cooking in the kitchen and having more aching today. Having more pops but not painful. Did get to HEP even with the holiday.      EVAL: Have been feeling OK since surgery, doing some leg stretches and bends but it feels achey and stiff. Feeling shaky when I get in/out of the shower like the left is weak. Hurt the knee standing up quickly, jumped up and heard something pop. Its been swelling a little bit around the incisions, no drainage or redness that I've noticed from the incision. Knee doesn't feel sturdy yet but I'm trying to wean myself off of the walker.   PERTINENT HISTORY: See above  PAIN:  Are you having pain? Yes: NPRS scale: 8/10 Pain location: R knee  Pain description: aching Aggravating factors: walking/WB especially in first few steps, transfers, overdoing   Relieving factors: movement , heat   PRECAUTIONS: PT LLE non load bearing exercises per MD referral, also legally blind R eye per PMH in Epic    RED FLAGS: None   WEIGHT BEARING RESTRICTIONS: No "MD told me to put as much weight as I can bear"   FALLS:  Has patient fallen in last 6 months? No  LIVING ENVIRONMENT: Lives with: lives with their family Lives in: House/apartment Stairs: 1 STE, bedroom is upstairs but can stay on first level  Has following equipment at  home: Environmental consultant - 2 wheeled  OCCUPATION: works with Hydrologist in corporate office- computer work, sedentary job   PLOF: Independent, Independent with basic ADLs, Independent with gait, and Independent with transfers  PATIENT GOALS: get back to favorite hobby (dancing- line dancing, swing, step dancing, etc), walk without limits/pain, be able to dance in cowboy boots by my birthday   NEXT MD VISIT: 11/10/23 with surgeon   OBJECTIVE:  Note: Objective measures were completed at Evaluation unless otherwise noted.  DIAGNOSTIC FINDINGS: Summary:  RIGHT:  - No evidence of common femoral vein obstruction.         LEFT:      - There is no evidence of deep vein thrombosis in the lower extremity.    - No cystic structure found in the popliteal fossa.     PATIENT SURVEYS:  FOTO 50, predicted 77 in 13 visits   COGNITION: Overall cognitive status: Within functional limits for tasks assessed     SENSATION: Not tested  EDEMA:  Appropriate for time since surgery (less than 2 weeks since time of eval)   LOWER EXTREMITY ROM:  Active ROM Right eval Left eval  Hip flexion    Hip extension    Hip abduction    Hip adduction    Hip internal rotation    Hip external rotation    Knee flexion 120* 94*  Knee extension 2* 5*  Ankle dorsiflexion    Ankle plantarflexion    Ankle inversion    Ankle eversion     (Blank rows = not tested)  LOWER EXTREMITY MMT:  MMT Right eval Left eval  Hip flexion 5 3+  Hip extension    Hip abduction 4+ 3  Hip adduction    Hip internal rotation    Hip external rotation    Knee flexion 5 3- pain   Knee extension 5 3 pain   Ankle dorsiflexion 5 5  Ankle plantarflexion    Ankle inversion    Ankle eversion     (Blank rows = not tested)    GAIT: Distance walked: in clinic distances  Assistive device utilized: Environmental consultant - 2 wheeled Level of assistance: Modified independence Comments: antalgic, limited ROM L knee though gait cycle,  trendelenburg  TREATMENT DATE:    10/14/23  TherEx  Single LE bridge R LE 2x10 (NWB surgical LE) SLRs L LE 2x10 0# Sidelying hip ABD 0# 2x10 B HS stretches 2x30 seconds B Piriformis stretches 1x30 seconds B supine  LAQs 2# 2x12 L LE  Prone HS curls 2# 2x12 L LE  Prone hip extensions 0# 2x12 L LE      10/11/23  Exam, care planning, education as below   SAQs 0# 10x3 second holds   L LE  SLRs 0# x10 L LE  HS slides 10x3 second holds L LE  Discussed LAQs (she is already doing these at home, recommended she continue with these) Sidelying hip ABD  L LE 0# x10  HS stretch 1x30 seconds L LE    PATIENT EDUCATION:  Education details: exam findings, HEP, POC, don't force knee through painful activities, early to try to wean RW or attempt multiple steps just yet, time needing to allow healing after surgery along with appropriate PT exercises, appropriate rate of exercise/activity progression post-op, heat vs cold and when/where to use each  Person educated: Patient Education method: Explanation, Demonstration, and Handouts Education comprehension: verbalized understanding, returned demonstration, and needs further education  HOME EXERCISE PROGRAM: Access Code: [email or web address] URL: [email or web address] Date: 10/11/2023 Prepared by: Nedra Hai  Exercises - Supine Short Arc Quad  - 2 x daily - 7 x weekly - 1 sets - 10 reps - 2-3 seconds hold - Active Straight Leg Raise with Quad Set  - 2 x daily - 7 x weekly - 1 sets - 10 reps - Supine Heel Slide  - 2 x daily - 7 x weekly - 2 sets - 10 reps - 3 seconds  hold - Sidelying Hip Abduction  - 2 x daily - 7 x weekly - 1 sets - 10 reps - Seated Long Arc Quad  - 2 x daily - 7 x weekly - 1 sets - 10 reps - 2-3 seconds  hold - Seated Hamstring Stretch  - 2 x daily - 7 x weekly - 1 sets - 2 reps - 30  seconds  hold  ASSESSMENT:  CLINICAL IMPRESSION:  Pt arrives doing OK today, having a bit of extra aching from likely overuse during the holiday. Continued working on open chain exercises, tolerated all interventions well.  Will continue efforts, will coordinate with MD to progress to CKC exercises as appropriate. Pain improved throughout session to 6/10.    EVAL: Patient is a 47 y.o. F who was seen today for physical therapy evaluation and treatment for Diagnosis Z98.890 (ICD-10-CM) - S/P left knee arthroscopy. Exam as expected and typical without significant complications noted. Should be able to progress well with skilled PT services.    OBJECTIVE IMPAIRMENTS: Abnormal gait, decreased activity tolerance, decreased coordination, decreased knowledge of use of DME, decreased mobility, difficulty walking, decreased ROM, decreased strength, increased edema, increased fascial restrictions, increased muscle spasms, impaired flexibility, obesity, and pain.   ACTIVITY LIMITATIONS: standing, squatting, sleeping, stairs, transfers, bed mobility, and locomotion level  PARTICIPATION LIMITATIONS: driving, shopping, community activity, occupation, and yard work  PERSONAL FACTORS: Age, Behavior pattern, Education, Fitness, Past/current experiences, and Time since onset of injury/illness/exacerbation are also affecting patient's functional outcome.   REHAB POTENTIAL: Good  CLINICAL DECISION MAKING: Stable/uncomplicated  EVALUATION COMPLEXITY: Low   GOALS: Goals reviewed with patient? No  SHORT TERM GOALS: Target date: 11/01/2023   Will be compliant with appropriate progressive HEP  Baseline: Goal status: INITIAL  2.  Pain to be  no more than 6/10 at worst  Baseline:  Goal status: INITIAL  3.  Will be independent with edema management strategies such as ice/elevation Baseline:  Goal status: INITIAL  4.  L knee AROM to be 0* extension, at least 115* flexion  Baseline:  Goal status:  INITIAL   LONG TERM GOALS: Target date: 11/22/2023    MMT to have improved by at least one grade all tested groups in L LE  Baseline:  Goal status: INITIAL  2.  Pain to be no more than 3/10 at worst  Baseline:  Goal status: INITIAL  3.  Will be able to ambulate community distances no device no increase from resting pain levels with WNL gait mechanics  Baseline:  Goal status: INITIAL  4.  Will be able to navigate steps with reciprocal pattern, no increase in pain  Baseline:  Goal status: INITIAL  5.  Will have been able to return to dancing (low impact styles at first) with no increase in pain  Baseline:  Goal status: INITIAL  6.  FOTO score to be within 5 points of predicted value by time of DC  Baseline:  Goal status: INITIAL   PLAN:  PT FREQUENCY: 2x/week  PT DURATION: 6 weeks  PLANNED INTERVENTIONS: 97164- PT Re-evaluation, 97110-Therapeutic exercises, 97530- Therapeutic activity, [id number]- Neuromuscular re-education, 97535- Self Care, 86578- Manual therapy, [id number]- Gait training, [phone]- Aquatic Therapy, 97014- Electrical stimulation (unattended), 97016- Vasopneumatic device, 97033- Ionotophoresis 4mg /ml Dexamethasone, Taping, Dry Needling, Cryotherapy, and Moist heat  PLAN FOR NEXT SESSION: progress as appropriate/tolerated, MD recommended NWB exercises at eval- message MD next week about progressing to CKC work (out of office this week)  Nedra Hai, PT, DPT 10/14/23 10:55 AM

## 2023-10-15 ENCOUNTER — Other Ambulatory Visit: Payer: Self-pay | Admitting: Orthopedic Surgery

## 2023-10-15 MED ORDER — OXYCODONE HCL 5 MG PO TABS: ORAL_TABLET | ORAL | 0 refills | Status: DC

## 2023-10-15 NOTE — Addendum Note (Signed)
Addended by: Rise Paganini on: 10/15/2023 12:29 PM   Modules accepted: Orders

## 2023-10-15 NOTE — Addendum Note (Signed)
Addended by: Cherre Huger E on: 10/15/2023 12:22 PM   Modules accepted: Orders

## 2023-10-15 NOTE — Telephone Encounter (Signed)
Patient called and said she needed a refill on Pain medication. [phone]

## 2023-10-18 ENCOUNTER — Other Ambulatory Visit: Payer: Self-pay | Admitting: Orthopedic Surgery

## 2023-10-18 ENCOUNTER — Ambulatory Visit (INDEPENDENT_AMBULATORY_CARE_PROVIDER_SITE_OTHER): Payer: BC Managed Care – PPO | Admitting: Physical Therapy

## 2023-10-18 ENCOUNTER — Encounter: Payer: Self-pay | Admitting: Physical Therapy

## 2023-10-18 DIAGNOSIS — R262 Difficulty in walking, not elsewhere classified: Secondary | ICD-10-CM

## 2023-10-18 DIAGNOSIS — M25662 Stiffness of left knee, not elsewhere classified: Secondary | ICD-10-CM

## 2023-10-18 DIAGNOSIS — M6281 Muscle weakness (generalized): Secondary | ICD-10-CM

## 2023-10-18 DIAGNOSIS — M25562 Pain in left knee: Secondary | ICD-10-CM | POA: Diagnosis not present

## 2023-10-18 DIAGNOSIS — R600 Localized edema: Secondary | ICD-10-CM

## 2023-10-18 MED ORDER — OXYCODONE HCL 5 MG PO TABS: 5.0000 mg | ORAL_TABLET | Freq: Three times a day (TID) | ORAL | 0 refills | Status: DC | PRN

## 2023-10-18 NOTE — Therapy (Signed)
OUTPATIENT PHYSICAL THERAPY LOWER EXTREMITY TREATMENT    Patient Name: Madison Welch MRN: [id number] DOB:05-26-1976, 47 y.o., female Today's Date: 10/18/2023  END OF SESSION:  PT End of Session - 10/18/23 1342     Visit Number 3    Number of Visits 13    Date for PT Re-Evaluation 11/22/23    Authorization Type BCBS    Authorization Time Period 10/11/23 to 11/22/23    PT Start Time 1343    PT Stop Time 1428    PT Time Calculation (min) 45 min    Activity Tolerance Patient tolerated treatment well    Behavior During Therapy [facility] for tasks assessed/performed               Past Medical History:  Diagnosis Date   Asthma    Headache(784.0)    Legally blind    right eye   Obesity    Past Surgical History:  Procedure Laterality Date   ABDOMINAL HYSTERECTOMY     partial   CESAREAN SECTION     CYSTOSCOPY  09/21/2012   Procedure: CYSTOSCOPY;  Surgeon: Dorien Chihuahua. Richardson Dopp, MD;  Location: [facility];  Service: Gynecology;  Laterality: N/A;  with cystotomy repair   EYE SURGERY     KNEE ARTHROSCOPY WITH MEDIAL MENISECTOMY Left 09/30/2023   Procedure: LEFT KNEE ARTHROSCOPY WITH MENISCAL DEBRIDEMENT;  Surgeon: Cammy Copa, MD;  Location: [facility] OR;  Service: Orthopedics;  Laterality: Left;   LEG SURGERY     ROBOTIC ASSISTED LAPAROSCOPIC LYSIS OF ADHESION  09/21/2012   Procedure: ROBOTIC ASSISTED LAPAROSCOPIC LYSIS OF ADHESION;  Surgeon: Dorien Chihuahua. Richardson Dopp, MD;  Location: [facility];  Service: Gynecology;  Laterality: N/A;   ROBOTIC ASSISTED TOTAL HYSTERECTOMY  09/21/2012   Procedure: ROBOTIC ASSISTED TOTAL HYSTERECTOMY;  Surgeon: Dorien Chihuahua. Richardson Dopp, MD;  Location: [facility];  Service: Gynecology;  Laterality: N/A;   TUBAL LIGATION     Patient Active Problem List   Diagnosis Date Noted   Acute lateral meniscus tear of left knee 10/09/2023   Acute medial meniscus tear of left knee 10/09/2023   Loose body in knee, left knee 10/09/2023   Easy bruising 07/14/2019   Morbid obesity with BMI of 50.0-59.9,  adult (HCC) 07/14/2019   Menorrhagia 09/22/2012   Dysmenorrhea 09/22/2012    PCP: Renaye Rakers MD   REFERRING PROVIDER: Cammy Copa, MD  REFERRING DIAG: Diagnosis [phone] (ICD-10-CM) - S/P left knee arthroscopy  THERAPY DIAG:  Difficulty in walking, not elsewhere classified  Acute pain of left knee  Muscle weakness (generalized)  Stiffness of left knee, not elsewhere classified  Localized edema  Rationale for Evaluation and Treatment: Rehabilitation  ONSET DATE: L knee arthroscopy with meniscal debridement 09/30/23  SUBJECTIVE:   SUBJECTIVE STATEMENT:   Had to go a funeral last Saturday, stepped wrong coming out of the funeral home and the knee swelled up. Iced it and its better now, just feel a pulling/cramping in my leg. Leg just feels weak.     EVAL: Have been feeling OK since surgery, doing some leg stretches and bends but it feels achey and stiff. Feeling shaky when I get in/out of the shower like the left is weak. Hurt the knee standing up quickly, jumped up and heard something pop. Its been swelling a little bit around the incisions, no drainage or redness that I've noticed from the incision. Knee doesn't feel sturdy yet but I'm trying to wean myself off of the walker.   PERTINENT HISTORY: See above  PAIN:  Are you having pain? Yes: NPRS scale: 5/10 Pain location: R knee  Pain description: pulling/cramping  Aggravating factors: walking/WB especially in first few steps, transfers, overdoing   Relieving factors: movement , heat   PRECAUTIONS: PT LLE non load bearing exercises per MD referral, also legally blind R eye per PMH in Epic    RED FLAGS: None   WEIGHT BEARING RESTRICTIONS: No "MD told me to put as much weight as I can bear"   FALLS:  Has patient fallen in last 6 months? No  LIVING ENVIRONMENT: Lives with: lives with their family Lives in: House/apartment Stairs: 1 STE, bedroom is upstairs but can stay on first level  Has following  equipment at home: Environmental consultant - 2 wheeled  OCCUPATION: works with Hydrologist in corporate office- computer work, sedentary job   PLOF: Independent, Independent with basic ADLs, Independent with gait, and Independent with transfers  PATIENT GOALS: get back to favorite hobby (dancing- line dancing, swing, step dancing, etc), walk without limits/pain, be able to dance in cowboy boots by my birthday   NEXT MD VISIT: 11/10/23 with surgeon   OBJECTIVE:  Note: Objective measures were completed at Evaluation unless otherwise noted.  DIAGNOSTIC FINDINGS: Summary:  RIGHT:  - No evidence of common femoral vein obstruction.         LEFT:      - There is no evidence of deep vein thrombosis in the lower extremity.    - No cystic structure found in the popliteal fossa.     PATIENT SURVEYS:  FOTO 50, predicted 77 in 13 visits   COGNITION: Overall cognitive status: Within functional limits for tasks assessed     SENSATION: Not tested  EDEMA:  Appropriate for time since surgery (less than 2 weeks since time of eval)   LOWER EXTREMITY ROM:  Active ROM Right eval Left eval Left 10/18/23  Hip flexion     Hip extension     Hip abduction     Hip adduction     Hip internal rotation     Hip external rotation     Knee flexion 120* 94* 110*  Knee extension 2* 5* 5*  Ankle dorsiflexion     Ankle plantarflexion     Ankle inversion     Ankle eversion      (Blank rows = not tested)  LOWER EXTREMITY MMT:  MMT Right eval Left eval  Hip flexion 5 3+  Hip extension    Hip abduction 4+ 3  Hip adduction    Hip internal rotation    Hip external rotation    Knee flexion 5 3- pain   Knee extension 5 3 pain   Ankle dorsiflexion 5 5  Ankle plantarflexion    Ankle inversion    Ankle eversion     (Blank rows = not tested)    GAIT: Distance walked: in clinic distances  Assistive device utilized: Environmental consultant - 2 wheeled Level of assistance: Modified independence Comments:  antalgic, limited ROM L knee though gait cycle, trendelenburg  TREATMENT DATE:   10/18/23  TherEx  Single LE bridge R LE 2x10 (NWB surgical LE) SLRs L LE 2x15 0# Side lying hip ABD x15 B 0# SLRs + ER 2x10  0# L LE Prone HS curls 3# 2x10  Prone hip extensions 3# 2x10  SAQs 3# 1x10, 4# 1x7  LAQs 3# 1x10, 4# 1x7   Manual  Percussion gun to L thigh to reduce mm tension and pain x8 minutes low frequency for mm recovery      10/14/23  TherEx  Single LE bridge R LE 2x10 (NWB surgical LE) SLRs L LE 2x10 0# Sidelying hip ABD 0# 2x10 B HS stretches 2x30 seconds B Piriformis stretches 1x30 seconds B supine  LAQs 2# 2x12 L LE  Prone HS curls 2# 2x12 L LE  Prone hip extensions 0# 2x12 L LE      10/11/23  Exam, care planning, education as below   SAQs 0# 10x3 second holds   L LE  SLRs 0# x10 L LE  HS slides 10x3 second holds L LE  Discussed LAQs (she is already doing these at home, recommended she continue with these) Sidelying hip ABD  L LE 0# x10  HS stretch 1x30 seconds L LE    PATIENT EDUCATION:  Education details: exam findings, HEP, POC, don't force knee through painful activities, early to try to wean RW or attempt multiple steps just yet, time needing to allow healing after surgery along with appropriate PT exercises, appropriate rate of exercise/activity progression post-op, heat vs cold and when/where to use each  Person educated: Patient Education method: Explanation, Demonstration, and Handouts Education comprehension: verbalized understanding, returned demonstration, and needs further education  HOME EXERCISE PROGRAM: Access Code: [email or web address] URL: [email or web address] Date: 10/11/2023 Prepared by: Nedra Hai  Exercises - Supine Short Arc Quad  - 2 x daily - 7 x weekly - 1 sets - 10 reps - 2-3 seconds hold -  Active Straight Leg Raise with Quad Set  - 2 x daily - 7 x weekly - 1 sets - 10 reps - Supine Heel Slide  - 2 x daily - 7 x weekly - 2 sets - 10 reps - 3 seconds  hold - Sidelying Hip Abduction  - 2 x daily - 7 x weekly - 1 sets - 10 reps - Seated Long Arc Quad  - 2 x daily - 7 x weekly - 1 sets - 10 reps - 2-3 seconds  hold - Seated Hamstring Stretch  - 2 x daily - 7 x weekly - 1 sets - 2 reps - 30 seconds  hold  ASSESSMENT:  CLINICAL IMPRESSION:  Pt arrives today doing better, pain is lower but she has had some increased edema after stepping wrong over the weekend although this is improving as well. ROM is already improving nicely. Continued with open chain exercises as per MD order, she could likely progress to CKC exercise soon and I messaged the referring MD about this today, awaiting response.    EVAL: Patient is a 47 y.o. F who was seen today for physical therapy evaluation and treatment for Diagnosis Z98.890 (ICD-10-CM) - S/P left knee arthroscopy. Exam as expected and typical without significant complications noted. Should be able to progress well with skilled PT services.    OBJECTIVE IMPAIRMENTS: Abnormal gait, decreased activity tolerance, decreased coordination, decreased knowledge of use of DME, decreased mobility, difficulty walking, decreased ROM, decreased strength, increased edema, increased fascial restrictions, increased muscle spasms, impaired flexibility, obesity, and pain.  ACTIVITY LIMITATIONS: standing, squatting, sleeping, stairs, transfers, bed mobility, and locomotion level  PARTICIPATION LIMITATIONS: driving, shopping, community activity, occupation, and yard work  PERSONAL FACTORS: Age, Behavior pattern, Education, Fitness, Past/current experiences, and Time since onset of injury/illness/exacerbation are also affecting patient's functional outcome.   REHAB POTENTIAL: Good  CLINICAL DECISION MAKING: Stable/uncomplicated  EVALUATION COMPLEXITY:  Low   GOALS: Goals reviewed with patient? No  SHORT TERM GOALS: Target date: 11/01/2023   Will be compliant with appropriate progressive HEP  Baseline: Goal status: INITIAL  2.  Pain to be no more than 6/10 at worst  Baseline:  Goal status: INITIAL  3.  Will be independent with edema management strategies such as ice/elevation Baseline:  Goal status: INITIAL  4.  L knee AROM to be 0* extension, at least 115* flexion  Baseline:  Goal status: INITIAL   LONG TERM GOALS: Target date: 11/22/2023    MMT to have improved by at least one grade all tested groups in L LE  Baseline:  Goal status: INITIAL  2.  Pain to be no more than 3/10 at worst  Baseline:  Goal status: INITIAL  3.  Will be able to ambulate community distances no device no increase from resting pain levels with WNL gait mechanics  Baseline:  Goal status: INITIAL  4.  Will be able to navigate steps with reciprocal pattern, no increase in pain  Baseline:  Goal status: INITIAL  5.  Will have been able to return to dancing (low impact styles at first) with no increase in pain  Baseline:  Goal status: INITIAL  6.  FOTO score to be within 5 points of predicted value by time of DC  Baseline:  Goal status: INITIAL   PLAN:  PT FREQUENCY: 2x/week  PT DURATION: 6 weeks  PLANNED INTERVENTIONS: 97164- PT Re-evaluation, 97110-Therapeutic exercises, 97530- Therapeutic activity, [id number]- Neuromuscular re-education, 97535- Self Care, [id number]- Manual therapy, [id number]- Gait training, [phone]- Aquatic Therapy, 97014- Electrical stimulation (unattended), 97016- Vasopneumatic device, [id number]- Ionotophoresis 4mg /ml Dexamethasone, Taping, Dry Needling, Cryotherapy, and Moist heat  PLAN FOR NEXT SESSION: progress as appropriate/tolerated, MD recommended NWB exercises at eval. Reached out to Dr. August Saucer via [facility] secure chat 10/18/23 about progressing to load bearing exercises- any response yet? Update HEP next visit   Nedra Hai, PT,  DPT 10/18/23 2:31 PM

## 2023-10-22 ENCOUNTER — Encounter: Payer: BC Managed Care – PPO | Admitting: Physical Therapy

## 2023-10-25 ENCOUNTER — Encounter: Payer: Self-pay | Admitting: Physical Therapy

## 2023-10-25 NOTE — Therapy (Signed)
 Per discussion with referring provider OK for WBAT exercises as tolerated  Nedra Hai, PT, DPT 10/25/23 10:07 AM

## 2023-10-26 ENCOUNTER — Ambulatory Visit (INDEPENDENT_AMBULATORY_CARE_PROVIDER_SITE_OTHER): Payer: BC Managed Care – PPO | Admitting: Physical Therapy

## 2023-10-26 ENCOUNTER — Encounter: Payer: Self-pay | Admitting: Physical Therapy

## 2023-10-26 DIAGNOSIS — R262 Difficulty in walking, not elsewhere classified: Secondary | ICD-10-CM

## 2023-10-26 DIAGNOSIS — M25662 Stiffness of left knee, not elsewhere classified: Secondary | ICD-10-CM

## 2023-10-26 DIAGNOSIS — R600 Localized edema: Secondary | ICD-10-CM

## 2023-10-26 DIAGNOSIS — M25562 Pain in left knee: Secondary | ICD-10-CM

## 2023-10-26 DIAGNOSIS — M6281 Muscle weakness (generalized): Secondary | ICD-10-CM | POA: Diagnosis not present

## 2023-10-26 NOTE — Therapy (Signed)
 OUTPATIENT PHYSICAL THERAPY LOWER EXTREMITY TREATMENT    Patient Name: Madison Welch MRN: [id number] DOB:11-07-1975, 48 y.o., female Today's Date: 10/26/2023  END OF SESSION:  PT End of Session - 10/26/23 0927     Visit Number 4    Number of Visits 13    Date for PT Re-Evaluation 11/22/23    Authorization Type BCBS    Authorization Time Period 10/11/23 to 11/22/23    PT Start Time 0930    PT Stop Time 1010    PT Time Calculation (min) 40 min    Activity Tolerance Patient tolerated treatment well    Behavior During Therapy [facility] for tasks assessed/performed                Past Medical History:  Diagnosis Date   Asthma    Headache(784.0)    Legally blind    right eye   Obesity    Past Surgical History:  Procedure Laterality Date   ABDOMINAL HYSTERECTOMY     partial   CESAREAN SECTION     CYSTOSCOPY  09/21/2012   Procedure: CYSTOSCOPY;  Surgeon: Rexene PARAS. Rosalva, MD;  Location: [facility];  Service: Gynecology;  Laterality: N/A;  with cystotomy repair   EYE SURGERY     KNEE ARTHROSCOPY WITH MEDIAL MENISECTOMY Left 09/30/2023   Procedure: LEFT KNEE ARTHROSCOPY WITH MENISCAL DEBRIDEMENT;  Surgeon: Addie Cordella Hamilton, MD;  Location: [facility] OR;  Service: Orthopedics;  Laterality: Left;   LEG SURGERY     ROBOTIC ASSISTED LAPAROSCOPIC LYSIS OF ADHESION  09/21/2012   Procedure: ROBOTIC ASSISTED LAPAROSCOPIC LYSIS OF ADHESION;  Surgeon: Rexene PARAS. Rosalva, MD;  Location: [facility];  Service: Gynecology;  Laterality: N/A;   ROBOTIC ASSISTED TOTAL HYSTERECTOMY  09/21/2012   Procedure: ROBOTIC ASSISTED TOTAL HYSTERECTOMY;  Surgeon: Rexene PARAS. Rosalva, MD;  Location: [facility];  Service: Gynecology;  Laterality: N/A;   TUBAL LIGATION     Patient Active Problem List   Diagnosis Date Noted   Acute lateral meniscus tear of left knee 10/09/2023   Acute medial meniscus tear of left knee 10/09/2023   Loose body in knee, left knee 10/09/2023   Easy bruising 07/14/2019   Morbid obesity with BMI of 50.0-59.9,  adult (HCC) 07/14/2019   Menorrhagia 09/22/2012   Dysmenorrhea 09/22/2012    PCP: Benjamine Aland MD   REFERRING PROVIDER: Addie Cordella Hamilton, MD  REFERRING DIAG: Diagnosis [phone] (ICD-10-CM) - S/P left knee arthroscopy  THERAPY DIAG:  Difficulty in walking, not elsewhere classified  Acute pain of left knee  Muscle weakness (generalized)  Stiffness of left knee, not elsewhere classified  Localized edema  Rationale for Evaluation and Treatment: Rehabilitation  ONSET DATE: L knee arthroscopy with meniscal debridement 09/30/23  SUBJECTIVE:   SUBJECTIVE STATEMENT: Knee has been more uncomfortable, seems to be having episodes with stepping funny and having a hard time getting knee to calm down.  EVAL: Have been feeling OK since surgery, doing some leg stretches and bends but it feels achey and stiff. Feeling shaky when I get in/out of the shower like the left is weak. Hurt the knee standing up quickly, jumped up and heard something pop. Its been swelling a little bit around the incisions, no drainage or redness that I've noticed from the incision. Knee doesn't feel sturdy yet but I'm trying to wean myself off of the walker.   PERTINENT HISTORY: See above  PAIN:  Are you having pain? Yes: NPRS scale: 7/10 Pain location: R knee  Pain description: pulling/cramping  Aggravating factors: walking/WB especially in first few steps, transfers, overdoing   Relieving factors: movement , heat   PRECAUTIONS: PT LLE non load bearing exercises per MD referral, also legally blind R eye per PMH in Epic    RED FLAGS: None   WEIGHT BEARING RESTRICTIONS: No MD told me to put as much weight as I can bear   FALLS:  Has patient fallen in last 6 months? 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  DIAGNOSTIC FINDINGS: Summary:  RIGHT:  - No evidence of common femoral vein obstruction.   LEFT:  - There is no evidence of deep vein thrombosis in the lower extremity.    - No cystic structure found in the popliteal fossa.     PATIENT SURVEYS:  FOTO 50, predicted 77 in 13 visits   COGNITION: Overall cognitive status: Within functional limits for tasks assessed     SENSATION: Not tested  EDEMA:  Appropriate for time since surgery (less than 2 weeks since time of eval)   LOWER EXTREMITY ROM:  Active ROM Right eval Left eval Left  10/18/23 Left 10/26/23  Knee flexion 120* 94* 110* 114  Knee extension 2* 5* 5*    (Blank rows = not tested)  LOWER EXTREMITY MMT:  MMT Right eval Left eval  Hip flexion 5 3+  Hip abduction 4+ 3  Knee flexion 5 3- pain   Knee extension 5 3 pain   Ankle dorsiflexion 5 5   (Blank rows = not tested)    GAIT: Distance walked: in clinic distances  Assistive device utilized: Environmental Consultant - 2 wheeled Level of assistance: Modified independence Comments: antalgic, limited ROM L knee though gait cycle, trendelenburg                                                                                                                                 TREATMENT DATE:  10/26/23 TherEx NuStep L6 x 10 min for ROM and warm up Leg Press bil 50# 2x10 Lt LAQ 4# 2x10; 3 sec hold Bridges 2x10 with 5 sec hold; equal weight bearing Sit to/from stand from elevated mat table x10, no UE support ROM  measurements - see above  10/18/23 TherEx Single LE bridge R LE 2x10 (NWB surgical LE) SLRs L LE 2x15 0# Side lying hip ABD x15  B 0# SLRs + ER 2x10  0# L LE Prone HS curls 3# 2x10  Prone hip extensions 3# 2x10  SAQs 3# 1x10, 4# 1x7  LAQs 3# 1x10, 4# 1x7   Manual Percussion gun to L thigh to reduce mm tension and pain x8 minutes low frequency for mm recovery      10/14/23 TherEx Single LE bridge R LE 2x10 (NWB surgical LE) SLRs L LE 2x10 0# Sidelying hip ABD 0# 2x10 B HS stretches 2x30 seconds B Piriformis stretches 1x30 seconds B supine  LAQs 2# 2x12 L LE  Prone HS curls 2# 2x12 L LE  Prone hip extensions 0# 2x12 L LE    PATIENT EDUCATION:  Education details: exam findings, HEP, POC, don't force knee through painful activities, early to try to wean RW or attempt multiple steps just yet, time needing to allow healing after surgery along with appropriate PT exercises, appropriate rate of exercise/activity progression post-op, heat vs cold and when/where to use each  Person educated: Patient Education method: Explanation, Demonstration, and Handouts Education comprehension: verbalized understanding, returned demonstration, and needs further education  HOME EXERCISE PROGRAM: Access Code: [email or web address] URL: [email or web address] Date: 10/11/2023 Prepared by: Josette Rough  Exercises - Supine Short Arc Quad  - 2 x daily - 7 x weekly - 1 sets - 10 reps - 2-3 seconds hold - Active Straight Leg Raise with Quad Set  - 2 x daily - 7 x weekly - 1 sets - 10 reps - Supine Heel Slide  - 2 x daily - 7 x weekly - 2 sets - 10 reps - 3 seconds  hold - Sidelying Hip Abduction  - 2 x daily - 7 x weekly - 1 sets - 10 reps - Seated Long Arc Quad  - 2 x daily - 7 x weekly - 1 sets - 10 reps - 2-3 seconds  hold - Seated Hamstring Stretch  - 2 x daily - 7 x weekly - 1 sets - 2 reps - 30 seconds  hold  ASSESSMENT:  CLINICAL IMPRESSION: Initiated some more weight bearing exercises  today with fair tolerance to activity.  ROM is improving at this time.  Will continue to benefit from PT to maximize function.   EVAL: Patient is a 48 y.o. F who was seen today for physical therapy evaluation and treatment for Diagnosis Z98.890 (ICD-10-CM) - S/P left knee arthroscopy. Exam as expected and typical without significant complications noted. Should be able to progress well with skilled PT services.    OBJECTIVE IMPAIRMENTS: Abnormal gait, decreased activity tolerance, decreased coordination, decreased knowledge of use of DME, decreased mobility, difficulty walking, decreased ROM, decreased strength, increased edema, increased fascial restrictions, increased muscle spasms, impaired flexibility, obesity, and pain.   ACTIVITY LIMITATIONS: standing, squatting, sleeping, stairs, transfers, bed mobility, and locomotion level  PARTICIPATION LIMITATIONS: driving, shopping, community activity, occupation, and yard work  PERSONAL FACTORS: Age, Behavior pattern, Education, Fitness, Past/current experiences, and Time since onset of injury/illness/exacerbation are also affecting patient's functional outcome.   REHAB POTENTIAL: Good  CLINICAL DECISION MAKING: Stable/uncomplicated  EVALUATION COMPLEXITY: Low   GOALS: Goals reviewed with patient? No  SHORT TERM GOALS: Target date: 11/01/2023   Will be compliant with appropriate progressive HEP  Baseline: Goal status: INITIAL  2.  Pain to be no more than 6/10 at worst  Baseline:  Goal status: INITIAL  3.  Will be independent with edema management strategies such as ice/elevation Baseline:  Goal status: INITIAL  4.  L knee AROM to  be 0* extension, at least 115* flexion  Baseline:  Goal status: INITIAL   LONG TERM GOALS: Target date: 11/22/2023    MMT to have improved by at least one grade all tested groups in L LE  Baseline:  Goal status: INITIAL  2.  Pain to be no more than 3/10 at worst  Baseline:  Goal status:  INITIAL  3.  Will be able to ambulate community distances no device no increase from resting pain levels with WNL gait mechanics  Baseline:  Goal status: INITIAL  4.  Will be able to navigate steps with reciprocal pattern, no increase in pain  Baseline:  Goal status: INITIAL  5.  Will have been able to return to dancing (low impact styles at first) with no increase in pain  Baseline:  Goal status: INITIAL  6.  FOTO score to be within 5 points of predicted value by time of DC  Baseline:  Goal status: INITIAL   PLAN:  PT FREQUENCY: 2x/week  PT DURATION: 6 weeks  PLANNED INTERVENTIONS: 97164- PT Re-evaluation, 97110-Therapeutic exercises, 97530- Therapeutic activity, [id number]- Neuromuscular re-education, 97535- Self Care, [id number]- Manual therapy, [id number]- Gait training, [phone]- Aquatic Therapy, 97014- Electrical stimulation (unattended), 97016- Vasopneumatic device, [id number]- Ionotophoresis 4mg /ml Dexamethasone , Taping, Dry Needling, Cryotherapy, and Moist heat  PLAN FOR NEXT SESSION: check STGs,  progress as appropriate/tolerated, needs more closed chain exercises and HEP updated  Corean JULIANNA Ku, PT, DPT 10/26/23 11:57 AM

## 2023-10-29 ENCOUNTER — Ambulatory Visit (INDEPENDENT_AMBULATORY_CARE_PROVIDER_SITE_OTHER): Payer: BC Managed Care – PPO | Admitting: Physical Therapy

## 2023-10-29 ENCOUNTER — Encounter: Payer: Self-pay | Admitting: Physical Therapy

## 2023-10-29 DIAGNOSIS — R600 Localized edema: Secondary | ICD-10-CM

## 2023-10-29 DIAGNOSIS — M25562 Pain in left knee: Secondary | ICD-10-CM | POA: Diagnosis not present

## 2023-10-29 DIAGNOSIS — R262 Difficulty in walking, not elsewhere classified: Secondary | ICD-10-CM

## 2023-10-29 DIAGNOSIS — M6281 Muscle weakness (generalized): Secondary | ICD-10-CM | POA: Diagnosis not present

## 2023-10-29 DIAGNOSIS — M25662 Stiffness of left knee, not elsewhere classified: Secondary | ICD-10-CM

## 2023-10-29 NOTE — Therapy (Signed)
 OUTPATIENT PHYSICAL THERAPY LOWER EXTREMITY TREATMENT    Patient Name: Madison Welch MRN: [id number] DOB:1976-07-07, 48 y.o., female Today's Date: 10/29/2023  END OF SESSION:  PT End of Session - 10/29/23 0940     Visit Number 5    Number of Visits 13    Date for PT Re-Evaluation 11/22/23    Authorization Type BCBS    Authorization Time Period 10/11/23 to 11/22/23    PT Start Time 0932    PT Stop Time 1011    PT Time Calculation (min) 39 min    Activity Tolerance Patient tolerated treatment well    Behavior During Therapy [facility] for tasks assessed/performed                 Past Medical History:  Diagnosis Date   Asthma    Headache(784.0)    Legally blind    right eye   Obesity    Past Surgical History:  Procedure Laterality Date   ABDOMINAL HYSTERECTOMY     partial   CESAREAN SECTION     CYSTOSCOPY  09/21/2012   Procedure: CYSTOSCOPY;  Surgeon: Rexene PARAS. Rosalva, MD;  Location: [facility];  Service: Gynecology;  Laterality: N/A;  with cystotomy repair   EYE SURGERY     KNEE ARTHROSCOPY WITH MEDIAL MENISECTOMY Left 09/30/2023   Procedure: LEFT KNEE ARTHROSCOPY WITH MENISCAL DEBRIDEMENT;  Surgeon: Addie Cordella Hamilton, MD;  Location: [facility] OR;  Service: Orthopedics;  Laterality: Left;   LEG SURGERY     ROBOTIC ASSISTED LAPAROSCOPIC LYSIS OF ADHESION  09/21/2012   Procedure: ROBOTIC ASSISTED LAPAROSCOPIC LYSIS OF ADHESION;  Surgeon: Rexene PARAS. Rosalva, MD;  Location: [facility];  Service: Gynecology;  Laterality: N/A;   ROBOTIC ASSISTED TOTAL HYSTERECTOMY  09/21/2012   Procedure: ROBOTIC ASSISTED TOTAL HYSTERECTOMY;  Surgeon: Rexene PARAS. Rosalva, MD;  Location: [facility];  Service: Gynecology;  Laterality: N/A;   TUBAL LIGATION     Patient Active Problem List   Diagnosis Date Noted   Acute lateral meniscus tear of left knee 10/09/2023   Acute medial meniscus tear of left knee 10/09/2023   Loose body in knee, left knee 10/09/2023   Easy bruising 07/14/2019   Morbid obesity with BMI of 50.0-59.9,  adult (HCC) 07/14/2019   Menorrhagia 09/22/2012   Dysmenorrhea 09/22/2012    PCP: Benjamine Aland MD   REFERRING PROVIDER: Addie Cordella Hamilton, MD  REFERRING DIAG: Diagnosis [phone] (ICD-10-CM) - S/P left knee arthroscopy  THERAPY DIAG:  Difficulty in walking, not elsewhere classified  Acute pain of left knee  Muscle weakness (generalized)  Stiffness of left knee, not elsewhere classified  Localized edema  Rationale for Evaluation and Treatment: Rehabilitation  ONSET DATE: L knee arthroscopy with meniscal debridement 09/30/23  SUBJECTIVE:   SUBJECTIVE STATEMENT:  Nothing new since last time, still stepping funny. Knee has just been achey yesterday and today, not sure if its the weather   EVAL: Have been feeling OK since surgery, doing some leg stretches and bends but it feels achey and stiff. Feeling shaky when I get in/out of the shower like the left is weak. Hurt the knee standing up quickly, jumped up and heard something pop. Its been swelling a little bit around the incisions, no drainage or redness that I've noticed from the incision. Knee doesn't feel sturdy yet but I'm trying to wean myself off of the walker.   PERTINENT HISTORY: See above  PAIN:  Are you having pain? Yes: NPRS scale: 7/10 now, over past week 5- 6/10 at  best and 8/10 at worst  Pain location: R knee  Pain description: aching  Aggravating factors: walking/WB especially in first few steps, transfers, overdoing, stretching out knee    Relieving factors: movement , heat, ice    PRECAUTIONS: updated 10/29/23- ok for WBAT exercise as tolerated per discussion with referring MD, also legally blind R eye per PMH in Epic    RED FLAGS: None   WEIGHT BEARING RESTRICTIONS: No MD told me to put as much weight as I can bear   FALLS:  Has patient fallen in last 6 months? No  LIVING ENVIRONMENT: Lives with: lives with their family Lives in: House/apartment Stairs: 1 STE, bedroom is upstairs but can stay on  first level  Has following equipment at home: Environmental Consultant - 2 wheeled  OCCUPATION: works with hydrologist in corporate office- computer work, sedentary job   PLOF: Independent, Independent with basic ADLs, Independent with gait, and Independent with transfers  PATIENT GOALS: get back to favorite hobby (dancing- line dancing, swing, step dancing, etc), walk without limits/pain, be able to dance in cowboy boots by my birthday   NEXT MD VISIT: 11/10/23 with surgeon   OBJECTIVE:  Note: Objective measures were completed at Evaluation unless otherwise noted.  DIAGNOSTIC FINDINGS: Summary:  RIGHT:  - No evidence of common femoral vein obstruction.   LEFT:  - There is no evidence of deep vein thrombosis in the lower extremity.    - No cystic structure found in the popliteal fossa.     PATIENT SURVEYS:  FOTO 50, predicted 77 in 13 visits   COGNITION: Overall cognitive status: Within functional limits for tasks assessed     SENSATION: Not tested  EDEMA:  Appropriate for time since surgery (less than 2 weeks since time of eval)   LOWER EXTREMITY ROM:  Active ROM Right eval Left eval Left  10/18/23 Left 10/26/23 Left 10/29/23  Knee flexion 120* 94* 110* 114 107* pain limited AROM   Knee extension 2* 5* 5*  4* supine heel prop, 15* seated LAQ AROM     (Blank rows = not tested)  LOWER EXTREMITY MMT:  MMT Right eval Left eval  Hip flexion 5 3+  Hip abduction 4+ 3  Knee flexion 5 3- pain   Knee extension 5 3 pain   Ankle dorsiflexion 5 5   (Blank rows = not tested)    GAIT: Distance walked: in clinic distances  Assistive device utilized: Environmental Consultant - 2 wheeled Level of assistance: Modified independence Comments: antalgic, limited ROM L knee though gait cycle, trendelenburg                                                                                                                                 TREATMENT DATE:    10/29/23  TherEx  Nustep L6 x10 minutes for  ROM, w/u, tissue perfusion STG update- see below  Bridges 2x10 cues for equal WB  STS with  L LE slightly staggered back x10 elevated mat table no UEs  Shuttle leg press BLEs 2x12 50# Shuttle leg press single leg (L LE) 18# x10 Forward step ups 2 inch box x10 B cues to not hyperextend L LE in stance      10/26/23 TherEx NuStep L6 x 10 min for ROM and warm up Leg Press bil 50# 2x10 Lt LAQ 4# 2x10; 3 sec hold Bridges 2x10 with 5 sec hold; equal weight bearing Sit to/from stand from elevated mat table x10, no UE support ROM measurements - see above  10/18/23 TherEx Single LE bridge R LE 2x10 (NWB surgical LE) SLRs L LE 2x15 0# Side lying hip ABD x15 B 0# SLRs + ER 2x10  0# L LE Prone HS curls 3# 2x10  Prone hip extensions 3# 2x10  SAQs 3# 1x10, 4# 1x7  LAQs 3# 1x10, 4# 1x7   Manual Percussion gun to L thigh to reduce mm tension and pain x8 minutes low frequency for mm recovery      10/14/23 TherEx Single LE bridge R LE 2x10 (NWB surgical LE) SLRs L LE 2x10 0# Sidelying hip ABD 0# 2x10 B HS stretches 2x30 seconds B Piriformis stretches 1x30 seconds B supine  LAQs 2# 2x12 L LE  Prone HS curls 2# 2x12 L LE  Prone hip extensions 0# 2x12 L LE    PATIENT EDUCATION:  Education details: exam findings, HEP, POC, don't force knee through painful activities, early to try to wean RW or attempt multiple steps just yet, time needing to allow healing after surgery along with appropriate PT exercises, appropriate rate of exercise/activity progression post-op, heat vs cold and when/where to use each  Person educated: Patient Education method: Explanation, Demonstration, and Handouts Education comprehension: verbalized understanding, returned demonstration, and needs further education  HOME EXERCISE PROGRAM: Access Code: [email or web address] URL: [email or web address] Date: 10/11/2023 Prepared by: Josette Rough  Exercises - Supine Short Arc Quad  - 2 x daily - 7 x weekly - 1  sets - 10 reps - 2-3 seconds hold - Active Straight Leg Raise with Quad Set  - 2 x daily - 7 x weekly - 1 sets - 10 reps - Supine Heel Slide  - 2 x daily - 7 x weekly - 2 sets - 10 reps - 3 seconds  hold - Sidelying Hip Abduction  - 2 x daily - 7 x weekly - 1 sets - 10 reps - Seated Long Arc Quad  - 2 x daily - 7 x weekly - 1 sets - 10 reps - 2-3 seconds  hold - Seated Hamstring Stretch  - 2 x daily - 7 x weekly - 1 sets - 2 reps - 30 seconds  hold  ASSESSMENT:  CLINICAL IMPRESSION:  Pt arrives today doing OK, still having a lot of aching in her knee. Continued work on the [employer] and otherwise kept progressing strength and ROM as appropriate/tolerated this session. Will continue efforts. Checked STGs, making slow but steady progress but high pain levels continue to be a lingering concern.    EVAL: Patient is a 48 y.o. F who was seen today for physical therapy evaluation and treatment for Diagnosis Z98.890 (ICD-10-CM) - S/P left knee arthroscopy. Exam as expected and typical without significant complications noted. Should be able to progress well with skilled PT services.    OBJECTIVE IMPAIRMENTS: Abnormal gait, decreased activity tolerance, decreased coordination, decreased knowledge of use of DME, decreased mobility, difficulty walking, decreased ROM, decreased strength, increased edema, increased fascial  restrictions, increased muscle spasms, impaired flexibility, obesity, and pain.   ACTIVITY LIMITATIONS: standing, squatting, sleeping, stairs, transfers, bed mobility, and locomotion level  PARTICIPATION LIMITATIONS: driving, shopping, community activity, occupation, and yard work  PERSONAL FACTORS: Age, Behavior pattern, Education, Fitness, Past/current experiences, and Time since onset of injury/illness/exacerbation are also affecting patient's functional outcome.   REHAB POTENTIAL: Good  CLINICAL DECISION MAKING: Stable/uncomplicated  EVALUATION COMPLEXITY: Low   GOALS: Goals  reviewed with patient? No  SHORT TERM GOALS: Target date: 11/01/2023   Will be compliant with appropriate progressive HEP  Baseline: Goal status: MET 10/29/23  2.  Pain to be no more than 6/10 at worst  Baseline:  Goal status: ONGOING 10/29/23  3.  Will be independent with edema management strategies such as ice/elevation Baseline:  Goal status: ONGOING 10/1023  4.  L knee AROM to be 0* extension, at least 115* flexion  Baseline:  Goal status: ONGOING 10/29/23   LONG TERM GOALS: Target date: 11/22/2023    MMT to have improved by at least one grade all tested groups in L LE  Baseline:  Goal status: INITIAL  2.  Pain to be no more than 3/10 at worst  Baseline:  Goal status: INITIAL  3.  Will be able to ambulate community distances no device no increase from resting pain levels with WNL gait mechanics  Baseline:  Goal status: INITIAL  4.  Will be able to navigate steps with reciprocal pattern, no increase in pain  Baseline:  Goal status: INITIAL  5.  Will have been able to return to dancing (low impact styles at first) with no increase in pain  Baseline:  Goal status: INITIAL  6.  FOTO score to be within 5 points of predicted value by time of DC  Baseline:  Goal status: INITIAL   PLAN:  PT FREQUENCY: 2x/week  PT DURATION: 6 weeks  PLANNED INTERVENTIONS: 97164- PT Re-evaluation, 97110-Therapeutic exercises, 97530- Therapeutic activity, 97112- Neuromuscular re-education, 97535- Self Care, 02859- Manual therapy, [id number]- Gait training, [id number]- Aquatic Therapy, 97014- Electrical stimulation (unattended), 97016- Vasopneumatic device, [id number]- Ionotophoresis 4mg /ml Dexamethasone , Taping, Dry Needling, Cryotherapy, and Moist heat  PLAN FOR NEXT SESSION:  progress as appropriate/tolerated, needs more closed chain exercises and HEP updated  Josette Rough, PT, DPT 10/29/23 10:11 AM

## 2023-11-02 ENCOUNTER — Other Ambulatory Visit: Payer: Self-pay | Admitting: Surgical

## 2023-11-02 ENCOUNTER — Encounter: Payer: BC Managed Care – PPO | Admitting: Physical Therapy

## 2023-11-02 MED ORDER — OXYCODONE HCL 5 MG PO TABS: 5.0000 mg | ORAL_TABLET | Freq: Two times a day (BID) | ORAL | 0 refills | Status: DC | PRN

## 2023-11-04 ENCOUNTER — Ambulatory Visit: Payer: BC Managed Care – PPO | Admitting: Physical Therapy

## 2023-11-04 ENCOUNTER — Encounter: Payer: Self-pay | Admitting: Physical Therapy

## 2023-11-04 DIAGNOSIS — R600 Localized edema: Secondary | ICD-10-CM | POA: Diagnosis not present

## 2023-11-04 DIAGNOSIS — M6281 Muscle weakness (generalized): Secondary | ICD-10-CM | POA: Diagnosis not present

## 2023-11-04 DIAGNOSIS — M25662 Stiffness of left knee, not elsewhere classified: Secondary | ICD-10-CM

## 2023-11-04 DIAGNOSIS — R262 Difficulty in walking, not elsewhere classified: Secondary | ICD-10-CM

## 2023-11-04 DIAGNOSIS — M25562 Pain in left knee: Secondary | ICD-10-CM | POA: Diagnosis not present

## 2023-11-04 NOTE — Therapy (Signed)
OUTPATIENT PHYSICAL THERAPY LOWER EXTREMITY TREATMENT    Patient Name: Madison Welch MRN: [id number] DOB:11/23/1975, 48 y.o., female Today's Date: 11/04/2023  END OF SESSION:  PT End of Session - 11/04/23 0949     Visit Number 6    Number of Visits 13    Date for PT Re-Evaluation 11/22/23    Authorization Type BCBS    Authorization Time Period 10/11/23 to 11/22/23    PT Start Time 0945   pt late   PT Stop Time 1012    PT Time Calculation (min) 27 min    Activity Tolerance Patient tolerated treatment well;Patient limited by pain    Behavior During Therapy [facility] for tasks assessed/performed                  Past Medical History:  Diagnosis Date   Asthma    Headache(784.0)    Legally blind    right eye   Obesity    Past Surgical History:  Procedure Laterality Date   ABDOMINAL HYSTERECTOMY     partial   CESAREAN SECTION     CYSTOSCOPY  09/21/2012   Procedure: CYSTOSCOPY;  Surgeon: Dorien Chihuahua. Richardson Dopp, MD;  Location: [facility];  Service: Gynecology;  Laterality: N/A;  with cystotomy repair   EYE SURGERY     KNEE ARTHROSCOPY WITH MEDIAL MENISECTOMY Left 09/30/2023   Procedure: LEFT KNEE ARTHROSCOPY WITH MENISCAL DEBRIDEMENT;  Surgeon: Cammy Copa, MD;  Location: [facility];  Service: Orthopedics;  Laterality: Left;   LEG SURGERY     ROBOTIC ASSISTED LAPAROSCOPIC LYSIS OF ADHESION  09/21/2012   Procedure: ROBOTIC ASSISTED LAPAROSCOPIC LYSIS OF ADHESION;  Surgeon: Dorien Chihuahua. Richardson Dopp, MD;  Location: [facility];  Service: Gynecology;  Laterality: N/A;   ROBOTIC ASSISTED TOTAL HYSTERECTOMY  09/21/2012   Procedure: ROBOTIC ASSISTED TOTAL HYSTERECTOMY;  Surgeon: Dorien Chihuahua. Richardson Dopp, MD;  Location: [facility];  Service: Gynecology;  Laterality: N/A;   TUBAL LIGATION     Patient Active Problem List   Diagnosis Date Noted   Acute lateral meniscus tear of left knee 10/09/2023   Acute medial meniscus tear of left knee 10/09/2023   Loose body in knee, left knee 10/09/2023   Easy bruising 07/14/2019    Morbid obesity with BMI of 50.0-59.9, adult (HCC) 07/14/2019   Menorrhagia 09/22/2012   Dysmenorrhea 09/22/2012    PCP: Renaye Rakers MD   REFERRING PROVIDER: Cammy Copa, MD  REFERRING DIAG: Diagnosis [phone] (ICD-10-CM) - S/P left knee arthroscopy  THERAPY DIAG:  Difficulty in walking, not elsewhere classified  Acute pain of left knee  Muscle weakness (generalized)  Localized edema  Stiffness of left knee, not elsewhere classified  Rationale for Evaluation and Treatment: Rehabilitation  ONSET DATE: L knee arthroscopy with meniscal debridement 09/30/23  SUBJECTIVE:   SUBJECTIVE STATEMENT:  Ended up staying in an [employer] when we went out of town for my uncle's funeral and had to do 4-5 steps to get in and out of the house. Going up was OK, felt it a lot more going down the stairs. Still really feeling it like a cramping overwork feeling. Didn't come Tuesday bc we didn't end up coming back until Tuesday.   EVAL: Have been feeling OK since surgery, doing some leg stretches and bends but it feels achey and stiff. Feeling shaky when I get in/out of the shower like the left is weak. Hurt the knee standing up quickly, jumped up and heard something pop. Its been swelling a little bit around the incisions,  no drainage or redness that I've noticed from the incision. Knee doesn't feel sturdy yet but I'm trying to wean myself off of the walker.   PERTINENT HISTORY: See above  PAIN:  Are you having pain? Yes: NPRS scale: 7/10 now, over past week  6-7/10, can get up to 8/10 still  Pain location: R knee  Pain description: aching, cramping   Aggravating factors: walking/WB especially in first few steps, transfers, overdoing, stretching out knee    Relieving factors: movement , heat, ice    PRECAUTIONS: updated 10/29/23- ok for WBAT exercise as tolerated per discussion with referring MD, also legally blind R eye per PMH in Epic    RED FLAGS: None   WEIGHT BEARING RESTRICTIONS:  No "MD told me to put as much weight as I can bear"   FALLS:  Has patient fallen in last 6 months? No  LIVING ENVIRONMENT: Lives with: lives with their family Lives in: House/apartment Stairs: 1 STE, bedroom is upstairs but can stay on first level  Has following equipment at home: Environmental consultant - 2 wheeled  OCCUPATION: works with Hydrologist in corporate office- computer work, sedentary job   PLOF: Independent, Independent with basic ADLs, Independent with gait, and Independent with transfers  PATIENT GOALS: get back to favorite hobby (dancing- line dancing, swing, step dancing, etc), walk without limits/pain, be able to dance in cowboy boots by my birthday   NEXT MD VISIT: 11/10/23 with surgeon   OBJECTIVE:  Note: Objective measures were completed at Evaluation unless otherwise noted.  DIAGNOSTIC FINDINGS: Summary:  RIGHT:  - No evidence of common femoral vein obstruction.   LEFT:  - There is no evidence of deep vein thrombosis in the lower extremity.    - No cystic structure found in the popliteal fossa.     PATIENT SURVEYS:  FOTO 50, predicted 77 in 13 visits   COGNITION: Overall cognitive status: Within functional limits for tasks assessed     SENSATION: Not tested  EDEMA:  Appropriate for time since surgery (less than 2 weeks since time of eval)   LOWER EXTREMITY ROM:  Active ROM Right eval Left eval Left  10/18/23 Left 10/26/23 Left 10/29/23  Knee flexion 120* 94* 110* 114 107* pain limited AROM   Knee extension 2* 5* 5*  4* supine heel prop, 15* seated LAQ AROM     (Blank rows = not tested)  LOWER EXTREMITY MMT:  MMT Right eval Left eval  Hip flexion 5 3+  Hip abduction 4+ 3  Knee flexion 5 3- pain   Knee extension 5 3 pain   Ankle dorsiflexion 5 5   (Blank rows = not tested)    GAIT: Distance walked: in clinic distances  Assistive device utilized: Environmental consultant - 2 wheeled Level of assistance: Modified independence Comments: antalgic, limited ROM  L knee though gait cycle, trendelenburg  TREATMENT DATE:    11/04/23   TherEx  Nustep L3x8 minutes (lowered resistance due to higher irritability of pain) LAQs 4# 12x2 second holds L LE    Manual  Percussion gun to hamstring and calf in prone to address mm cramping and spasms         10/29/23  TherEx  Nustep L6 x10 minutes for ROM, w/u, tissue perfusion STG update- see below  Bridges 2x10 cues for equal WB  STS with L LE slightly staggered back x10 elevated mat table no UEs  Shuttle leg press BLEs 2x12 50# Shuttle leg press single leg (L LE) 18# x10 Forward step ups 2 inch box x10 B cues to not hyperextend L LE in stance      10/26/23 TherEx NuStep L6 x 10 min for ROM and warm up Leg Press bil 50# 2x10 Lt LAQ 4# 2x10; 3 sec hold Bridges 2x10 with 5 sec hold; equal weight bearing Sit to/from stand from elevated mat table x10, no UE support ROM measurements - see above  10/18/23 TherEx Single LE bridge R LE 2x10 (NWB surgical LE) SLRs L LE 2x15 0# Side lying hip ABD x15 B 0# SLRs + ER 2x10  0# L LE Prone HS curls 3# 2x10  Prone hip extensions 3# 2x10  SAQs 3# 1x10, 4# 1x7  LAQs 3# 1x10, 4# 1x7   Manual Percussion gun to L thigh to reduce mm tension and pain x8 minutes low frequency for mm recovery      10/14/23 TherEx Single LE bridge R LE 2x10 (NWB surgical LE) SLRs L LE 2x10 0# Sidelying hip ABD 0# 2x10 B HS stretches 2x30 seconds B Piriformis stretches 1x30 seconds B supine  LAQs 2# 2x12 L LE  Prone HS curls 2# 2x12 L LE  Prone hip extensions 0# 2x12 L LE    PATIENT EDUCATION:  Education details: exam findings, HEP, POC, don't force knee through painful activities, early to try to wean RW or attempt multiple steps just yet, time needing to allow healing after surgery along with appropriate PT exercises,  appropriate rate of exercise/activity progression post-op, heat vs cold and when/where to use each  Person educated: Patient Education method: Explanation, Demonstration, and Handouts Education comprehension: verbalized understanding, returned demonstration, and needs further education  HOME EXERCISE PROGRAM: Access Code: [email or web address] URL: [email or web address] Date: 10/11/2023 Prepared by: Nedra Hai  Exercises - Supine Short Arc Quad  - 2 x daily - 7 x weekly - 1 sets - 10 reps - 2-3 seconds hold - Active Straight Leg Raise with Quad Set  - 2 x daily - 7 x weekly - 1 sets - 10 reps - Supine Heel Slide  - 2 x daily - 7 x weekly - 2 sets - 10 reps - 3 seconds  hold - Sidelying Hip Abduction  - 2 x daily - 7 x weekly - 1 sets - 10 reps - Seated Long Arc Quad  - 2 x daily - 7 x weekly - 1 sets - 10 reps - 2-3 seconds  hold - Seated Hamstring Stretch  - 2 x daily - 7 x weekly - 1 sets - 2 reps - 30 seconds  hold  ASSESSMENT:  CLINICAL IMPRESSION:   Pt arrives today with some increased irritability of pain after being extra active and having to do steps/stand more at a funeral. We reduced intensity of exercises today to try to promote active recovery, also introduced percussion gun to address mm cramping/increased pain today.  Session limited due to late pt arrival.    EVAL: Patient is a 48 y.o. F who was seen today for physical therapy evaluation and treatment for Diagnosis Z98.890 (ICD-10-CM) - S/P left knee arthroscopy. Exam as expected and typical without significant complications noted. Should be able to progress well with skilled PT services.    OBJECTIVE IMPAIRMENTS: Abnormal gait, decreased activity tolerance, decreased coordination, decreased knowledge of use of DME, decreased mobility, difficulty walking, decreased ROM, decreased strength, increased edema, increased fascial restrictions, increased muscle spasms, impaired flexibility, obesity, and pain.   ACTIVITY  LIMITATIONS: standing, squatting, sleeping, stairs, transfers, bed mobility, and locomotion level  PARTICIPATION LIMITATIONS: driving, shopping, community activity, occupation, and yard work  PERSONAL FACTORS: Age, Behavior pattern, Education, Fitness, Past/current experiences, and Time since onset of injury/illness/exacerbation are also affecting patient's functional outcome.   REHAB POTENTIAL: Good  CLINICAL DECISION MAKING: Stable/uncomplicated  EVALUATION COMPLEXITY: Low   GOALS: Goals reviewed with patient? No  SHORT TERM GOALS: Target date: 11/01/2023   Will be compliant with appropriate progressive HEP  Baseline: Goal status: MET 10/29/23  2.  Pain to be no more than 6/10 at worst  Baseline:  Goal status: ONGOING 10/29/23  3.  Will be independent with edema management strategies such as ice/elevation Baseline:  Goal status: ONGOING 10/1023  4.  L knee AROM to be 0* extension, at least 115* flexion  Baseline:  Goal status: ONGOING 10/29/23   LONG TERM GOALS: Target date: 11/22/2023    MMT to have improved by at least one grade all tested groups in L LE  Baseline:  Goal status: INITIAL  2.  Pain to be no more than 3/10 at worst  Baseline:  Goal status: INITIAL  3.  Will be able to ambulate community distances no device no increase from resting pain levels with WNL gait mechanics  Baseline:  Goal status: INITIAL  4.  Will be able to navigate steps with reciprocal pattern, no increase in pain  Baseline:  Goal status: INITIAL  5.  Will have been able to return to dancing (low impact styles at first) with no increase in pain  Baseline:  Goal status: INITIAL  6.  FOTO score to be within 5 points of predicted value by time of DC  Baseline:  Goal status: INITIAL   PLAN:  PT FREQUENCY: 2x/week  PT DURATION: 6 weeks  PLANNED INTERVENTIONS: 97164- PT Re-evaluation, 97110-Therapeutic exercises, 97530- Therapeutic activity, 97112- Neuromuscular re-education,  97535- Self Care, 16109- Manual therapy, [id number]- Gait training, [id number]- Aquatic Therapy, 97014- Electrical stimulation (unattended), 97016- Vasopneumatic device, [id number]- Ionotophoresis 4mg /ml Dexamethasone, Taping, Dry Needling, Cryotherapy, and Moist heat  PLAN FOR NEXT SESSION:  progress as appropriate/tolerated, needs more closed chain exercises and HEP updated. Progress note prior to MD appt   Nedra Hai, PT, DPT 11/04/23 10:14 AM

## 2023-11-08 ENCOUNTER — Encounter: Payer: BC Managed Care – PPO | Admitting: Physical Therapy

## 2023-11-10 ENCOUNTER — Ambulatory Visit (INDEPENDENT_AMBULATORY_CARE_PROVIDER_SITE_OTHER): Payer: BC Managed Care – PPO | Admitting: Orthopedic Surgery

## 2023-11-10 ENCOUNTER — Encounter: Payer: Self-pay | Admitting: Physical Therapy

## 2023-11-10 ENCOUNTER — Ambulatory Visit (INDEPENDENT_AMBULATORY_CARE_PROVIDER_SITE_OTHER): Payer: BC Managed Care – PPO | Admitting: Physical Therapy

## 2023-11-10 ENCOUNTER — Encounter: Payer: Self-pay | Admitting: Orthopedic Surgery

## 2023-11-10 DIAGNOSIS — Z9889 Other specified postprocedural states: Secondary | ICD-10-CM

## 2023-11-10 DIAGNOSIS — M25562 Pain in left knee: Secondary | ICD-10-CM | POA: Diagnosis not present

## 2023-11-10 DIAGNOSIS — R262 Difficulty in walking, not elsewhere classified: Secondary | ICD-10-CM | POA: Diagnosis not present

## 2023-11-10 DIAGNOSIS — M25662 Stiffness of left knee, not elsewhere classified: Secondary | ICD-10-CM

## 2023-11-10 DIAGNOSIS — R600 Localized edema: Secondary | ICD-10-CM

## 2023-11-10 DIAGNOSIS — M6281 Muscle weakness (generalized): Secondary | ICD-10-CM

## 2023-11-10 NOTE — Therapy (Signed)
OUTPATIENT PHYSICAL THERAPY LOWER EXTREMITY TREATMENT    Patient Name: Madison Welch MRN: [id number] DOB:1976-04-07, 48 y.o., female Today's Date: 11/10/2023  END OF SESSION:  PT End of Session - 11/10/23 0938     Visit Number 7    Number of Visits 13    Date for PT Re-Evaluation 11/22/23    Authorization Type BCBS    Authorization Time Period 10/11/23 to 11/22/23    PT Start Time 0938   pt arrived late   PT Stop Time 1010    PT Time Calculation (min) 32 min    Activity Tolerance Patient tolerated treatment well;Patient limited by pain    Behavior During Therapy [facility] for tasks assessed/performed                   Past Medical History:  Diagnosis Date   Asthma    Headache(784.0)    Legally blind    right eye   Obesity    Past Surgical History:  Procedure Laterality Date   ABDOMINAL HYSTERECTOMY     partial   CESAREAN SECTION     CYSTOSCOPY  09/21/2012   Procedure: CYSTOSCOPY;  Surgeon: Dorien Chihuahua. Richardson Dopp, MD;  Location: [facility];  Service: Gynecology;  Laterality: N/A;  with cystotomy repair   EYE SURGERY     KNEE ARTHROSCOPY WITH MEDIAL MENISECTOMY Left 09/30/2023   Procedure: LEFT KNEE ARTHROSCOPY WITH MENISCAL DEBRIDEMENT;  Surgeon: Cammy Copa, MD;  Location: [facility] OR;  Service: Orthopedics;  Laterality: Left;   LEG SURGERY     ROBOTIC ASSISTED LAPAROSCOPIC LYSIS OF ADHESION  09/21/2012   Procedure: ROBOTIC ASSISTED LAPAROSCOPIC LYSIS OF ADHESION;  Surgeon: Dorien Chihuahua. Richardson Dopp, MD;  Location: [facility];  Service: Gynecology;  Laterality: N/A;   ROBOTIC ASSISTED TOTAL HYSTERECTOMY  09/21/2012   Procedure: ROBOTIC ASSISTED TOTAL HYSTERECTOMY;  Surgeon: Dorien Chihuahua. Richardson Dopp, MD;  Location: [facility];  Service: Gynecology;  Laterality: N/A;   TUBAL LIGATION     Patient Active Problem List   Diagnosis Date Noted   Acute lateral meniscus tear of left knee 10/09/2023   Acute medial meniscus tear of left knee 10/09/2023   Loose body in knee, left knee 10/09/2023   Easy bruising  07/14/2019   Morbid obesity with BMI of 50.0-59.9, adult (HCC) 07/14/2019   Menorrhagia 09/22/2012   Dysmenorrhea 09/22/2012    PCP: Renaye Rakers MD   REFERRING PROVIDER: Cammy Copa, MD  REFERRING DIAG: Diagnosis [phone] (ICD-10-CM) - S/P left knee arthroscopy  THERAPY DIAG:  Difficulty in walking, not elsewhere classified  Acute pain of left knee  Muscle weakness (generalized)  Localized edema  Stiffness of left knee, not elsewhere classified  Rationale for Evaluation and Treatment: Rehabilitation  ONSET DATE: L knee arthroscopy with meniscal debridement 09/30/23  SUBJECTIVE:   SUBJECTIVE STATEMENT: Using RW a little less; wants to know when to get rid of walker. Pain is worse in the morning.   EVAL: Have been feeling OK since surgery, doing some leg stretches and bends but it feels achey and stiff. Feeling shaky when I get in/out of the shower like the left is weak. Hurt the knee standing up quickly, jumped up and heard something pop. Its been swelling a little bit around the incisions, no drainage or redness that I've noticed from the incision. Knee doesn't feel sturdy yet but I'm trying to wean myself off of the walker.   PERTINENT HISTORY: See above  PAIN:  Are you having pain? Yes: NPRS scale: 7/10 now,  over past week  6-7/10, can get up to 8/10 still  Pain location: R knee  Pain description: aching, cramping   Aggravating factors: walking/WB especially in first few steps, transfers, overdoing, stretching out knee    Relieving factors: movement , heat, ice    PRECAUTIONS: updated 10/29/23- ok for WBAT exercise as tolerated per discussion with referring MD, also legally blind R eye per PMH in Epic    RED FLAGS: None   WEIGHT BEARING RESTRICTIONS: No "MD told me to put as much weight as I can bear"   FALLS:  Has patient fallen in last 6 months? No  LIVING ENVIRONMENT: Lives with: lives with their family Lives in: House/apartment Stairs: 1 STE,  bedroom is upstairs but can stay on first level  Has following equipment at home: Environmental consultant - 2 wheeled  OCCUPATION: works with Hydrologist in corporate office- computer work, sedentary job   PLOF: Independent, Independent with basic ADLs, Independent with gait, and Independent with transfers  PATIENT GOALS: get back to favorite hobby (dancing- line dancing, swing, step dancing, etc), walk without limits/pain, be able to dance in cowboy boots by my birthday   NEXT MD VISIT: 11/10/23 with surgeon   OBJECTIVE:  Note: Objective measures were completed at Evaluation unless otherwise noted.  DIAGNOSTIC FINDINGS: Summary:  RIGHT:  - No evidence of common femoral vein obstruction.   LEFT:  - There is no evidence of deep vein thrombosis in the lower extremity.    - No cystic structure found in the popliteal fossa.     PATIENT SURVEYS:  FOTO 50, predicted 77 in 13 visits   COGNITION: Overall cognitive status: Within functional limits for tasks assessed     SENSATION: Not tested  EDEMA:  Appropriate for time since surgery (less than 2 weeks since time of eval)   LOWER EXTREMITY ROM:  Active ROM Right eval Left eval Left  10/18/23 Left 10/26/23 Left  10/29/23 Left 11/10/23  Knee flexion 120* 94* 110* 114 107* pain limited AROM  A:110 After heel slides: 113  Knee extension 2* 5* 5*  4* supine heel prop, 15* seated LAQ AROM   A: 4 (hyperextension; LAQ)   (Blank rows = not tested)  LOWER EXTREMITY MMT:  MMT Right eval Left eval  Hip flexion 5 3+  Hip abduction 4+ 3  Knee flexion 5 3- pain   Knee extension 5 3 pain   Ankle dorsiflexion 5 5   (Blank rows = not tested)    GAIT: Distance walked: in clinic distances  Assistive device utilized: Environmental consultant - 2 wheeled Level of assistance: Modified independence Comments: antalgic, limited ROM L knee though gait cycle, trendelenburg                                                                                                                                  TREATMENT DATE:  11/10/23 TherEx Nustep L6x8 minutes LAQs 4# 2x10; 2  sec hold AROM measurements -see above for details AA heelslides x 10 reps   Gait Amb with SPC with supervision and cues for sequencing; negotiated 6 stairs with single handrail and SPC with supervision and min cues for sequencing  11/04/23 TherEx Nustep L3x8 minutes (lowered resistance due to higher irritability of pain) LAQs 4# 12x2 second holds L LE   Manual Percussion gun to hamstring and calf in prone to address mm cramping and spasms   10/29/23 TherEx Nustep L6 x10 minutes for ROM, w/u, tissue perfusion STG update- see below  Bridges 2x10 cues for equal WB  STS with L LE slightly staggered back x10 elevated mat table no UEs  Shuttle leg press BLEs 2x12 50# Shuttle leg press single leg (L LE) 18# x10 Forward step ups 2 inch box x10 B cues to not hyperextend L LE in stance     10/26/23 TherEx NuStep L6 x 10 min for ROM and warm up Leg Press bil 50# 2x10 Lt LAQ 4# 2x10; 3 sec hold Bridges 2x10 with 5 sec hold; equal weight bearing Sit to/from stand from elevated mat table x10, no UE support ROM measurements - see above  10/18/23 TherEx Single LE bridge R LE 2x10 (NWB surgical LE) SLRs L LE 2x15 0# Side lying hip ABD x15 B 0# SLRs + ER 2x10  0# L LE Prone HS curls 3# 2x10  Prone hip extensions 3# 2x10  SAQs 3# 1x10, 4# 1x7  LAQs 3# 1x10, 4# 1x7   Manual Percussion gun to L thigh to reduce mm tension and pain x8 minutes low frequency for mm recovery    PATIENT EDUCATION:  Education details: exam findings, HEP, POC, don't force knee through painful activities, early to try to wean RW or attempt multiple steps just yet, time needing to allow healing after surgery along with appropriate PT exercises, appropriate rate of exercise/activity progression post-op, heat vs cold and when/where to use each  Person educated: Patient Education method: Explanation,  Demonstration, and Handouts Education comprehension: verbalized understanding, returned demonstration, and needs further education  HOME EXERCISE PROGRAM: Access Code: [email or web address] URL: [email or web address] Date: 10/11/2023 Prepared by: Nedra Hai  Exercises - Supine Short Arc Quad  - 2 x daily - 7 x weekly - 1 sets - 10 reps - 2-3 seconds hold - Active Straight Leg Raise with Quad Set  - 2 x daily - 7 x weekly - 1 sets - 10 reps - Supine Heel Slide  - 2 x daily - 7 x weekly - 2 sets - 10 reps - 3 seconds  hold - Sidelying Hip Abduction  - 2 x daily - 7 x weekly - 1 sets - 10 reps - Seated Long Arc Quad  - 2 x daily - 7 x weekly - 1 sets - 10 reps - 2-3 seconds  hold - Seated Hamstring Stretch  - 2 x daily - 7 x weekly - 1 sets - 2 reps - 30 seconds  hold  ASSESSMENT:  CLINICAL IMPRESSION: Pt safe to amb with SPC if she would like but would need to purchase at this time.  ROM continues to improve and will continue to focus PT on strengthening and balance.     Session limited due to late pt arrival.    EVAL: Patient is a 48 y.o. F who was seen today for physical therapy evaluation and treatment for Diagnosis Z98.890 (ICD-10-CM) - S/P left knee arthroscopy. Exam as expected and typical without significant complications noted. Should be able  to progress well with skilled PT services.    OBJECTIVE IMPAIRMENTS: Abnormal gait, decreased activity tolerance, decreased coordination, decreased knowledge of use of DME, decreased mobility, difficulty walking, decreased ROM, decreased strength, increased edema, increased fascial restrictions, increased muscle spasms, impaired flexibility, obesity, and pain.   ACTIVITY LIMITATIONS: standing, squatting, sleeping, stairs, transfers, bed mobility, and locomotion level  PARTICIPATION LIMITATIONS: driving, shopping, community activity, occupation, and yard work  PERSONAL FACTORS: Age, Behavior pattern, Education, Fitness, Past/current  experiences, and Time since onset of injury/illness/exacerbation are also affecting patient's functional outcome.   REHAB POTENTIAL: Good  CLINICAL DECISION MAKING: Stable/uncomplicated  EVALUATION COMPLEXITY: Low   GOALS: Goals reviewed with patient? No  SHORT TERM GOALS: Target date: 11/01/2023   Will be compliant with appropriate progressive HEP  Baseline: Goal status: MET 10/29/23  2.  Pain to be no more than 6/10 at worst  Baseline:  Goal status: ONGOING 10/29/23  3.  Will be independent with edema management strategies such as ice/elevation Baseline:  Goal status: ONGOING 10/1023  4.  L knee AROM to be 0* extension, at least 115* flexion  Baseline:  Goal status: ONGOING 10/29/23   LONG TERM GOALS: Target date: 11/22/2023    MMT to have improved by at least one grade all tested groups in L LE  Baseline:  Goal status: INITIAL  2.  Pain to be no more than 3/10 at worst  Baseline:  Goal status: INITIAL  3.  Will be able to ambulate community distances no device no increase from resting pain levels with WNL gait mechanics  Baseline:  Goal status: INITIAL  4.  Will be able to navigate steps with reciprocal pattern, no increase in pain  Baseline:  Goal status: INITIAL  5.  Will have been able to return to dancing (low impact styles at first) with no increase in pain  Baseline:  Goal status: INITIAL  6.  FOTO score to be within 5 points of predicted value by time of DC  Baseline:  Goal status: INITIAL   PLAN:  PT FREQUENCY: 2x/week  PT DURATION: 6 weeks  PLANNED INTERVENTIONS: 97164- PT Re-evaluation, 97110-Therapeutic exercises, 97530- Therapeutic activity, [id number]- Neuromuscular re-education, 97535- Self Care, [id number]- Manual therapy, [id number]- Gait training, [id number]- Aquatic Therapy, 97014- Electrical stimulation (unattended), 97016- Vasopneumatic device, [id number]- Ionotophoresis 4mg /ml Dexamethasone, Taping, Dry Needling, Cryotherapy, and Moist heat  PLAN FOR  NEXT SESSION: see what MD says, progress as appropriate/tolerated, needs more closed chain exercises and HEP updated.  Clarita Crane, PT, DPT 11/10/23 10:12 AM

## 2023-11-10 NOTE — Progress Notes (Signed)
   Post-Op Visit Note   Patient: Madison Welch           Date of Birth: [birth year]           MRN: [id number] Visit Date: 11/10/2023 PCP: Renaye Rakers, MD   Assessment & Plan:  Chief Complaint:  Chief Complaint  Patient presents with   Left Knee - Routine Post Op    09/30/23 left knee scope with meniscal deb   Visit Diagnoses:  1. S/P left knee arthroscopy     Plan: Patient is now about 6 weeks out left knee arthroscopy with meniscal root debridement of a radial tear as well as removal of loose bodies.  She is in physical therapy.  Using a walker.  On examination no calf tenderness negative Homans.  Trace effusion.  Flexes past 90.  Plan at this time is to continue physical therapy for nonweightbearing quad strengthening exercises.  Anticipate return to work on 12/22/2023.  She will follow-up in 6 weeks for clinical recheck.  Handicap sticker provided for 6 months.  Follow-Up Instructions: No follow-ups on file.   Orders:  No orders of the defined types were placed in this encounter.  No orders of the defined types were placed in this encounter.   Imaging: No results found.  PMFS History: Patient Active Problem List   Diagnosis Date Noted   Acute lateral meniscus tear of left knee 10/09/2023   Acute medial meniscus tear of left knee 10/09/2023   Loose body in knee, left knee 10/09/2023   Easy bruising 07/14/2019   Morbid obesity with BMI of 50.0-59.9, adult (HCC) 07/14/2019   Menorrhagia 09/22/2012   Dysmenorrhea 09/22/2012   Past Medical History:  Diagnosis Date   Asthma    Headache(784.0)    Legally blind    right eye   Obesity     Family History  Problem Relation Age of Onset   Lung cancer Father        metastatic to brain    Past Surgical History:  Procedure Laterality Date   ABDOMINAL HYSTERECTOMY     partial   CESAREAN SECTION     CYSTOSCOPY  09/21/2012   Procedure: CYSTOSCOPY;  Surgeon: Dorien Chihuahua. Richardson Dopp, MD;  Location: [facility];  Service: Gynecology;   Laterality: N/A;  with cystotomy repair   EYE SURGERY     KNEE ARTHROSCOPY WITH MEDIAL MENISECTOMY Left 09/30/2023   Procedure: LEFT KNEE ARTHROSCOPY WITH MENISCAL DEBRIDEMENT;  Surgeon: Cammy Copa, MD;  Location: [facility] OR;  Service: Orthopedics;  Laterality: Left;   LEG SURGERY     ROBOTIC ASSISTED LAPAROSCOPIC LYSIS OF ADHESION  09/21/2012   Procedure: ROBOTIC ASSISTED LAPAROSCOPIC LYSIS OF ADHESION;  Surgeon: Dorien Chihuahua. Richardson Dopp, MD;  Location: [facility];  Service: Gynecology;  Laterality: N/A;   ROBOTIC ASSISTED TOTAL HYSTERECTOMY  09/21/2012   Procedure: ROBOTIC ASSISTED TOTAL HYSTERECTOMY;  Surgeon: Dorien Chihuahua. Richardson Dopp, MD;  Location: [facility];  Service: Gynecology;  Laterality: N/A;   TUBAL LIGATION     Social History   Occupational History   Not on file  Tobacco Use   Smoking status: Every Day    Types: Cigarettes   Smokeless tobacco: Never  Vaping Use   Vaping status: Never Used  Substance and Sexual Activity   Alcohol use: Yes   Drug use: Yes    Frequency: 2.0 times per week    Types: Marijuana   Sexual activity: Not on file

## 2023-11-11 ENCOUNTER — Emergency Department (HOSPITAL_COMMUNITY): Payer: BC Managed Care – PPO

## 2023-11-11 ENCOUNTER — Emergency Department (HOSPITAL_COMMUNITY)
Admission: EM | Admit: 2023-11-11 | Discharge: 2023-11-11 | Discharge status: Against Medical Advice | Payer: BC Managed Care – PPO | Attending: Emergency Medicine | Admitting: Emergency Medicine

## 2023-11-11 ENCOUNTER — Encounter: Payer: Self-pay | Admitting: Orthopedic Surgery

## 2023-11-11 ENCOUNTER — Other Ambulatory Visit: Payer: Self-pay

## 2023-11-11 DIAGNOSIS — M79631 Pain in right forearm: Secondary | ICD-10-CM | POA: Diagnosis not present

## 2023-11-11 DIAGNOSIS — M25552 Pain in left hip: Secondary | ICD-10-CM | POA: Insufficient documentation

## 2023-11-11 DIAGNOSIS — M25562 Pain in left knee: Secondary | ICD-10-CM | POA: Insufficient documentation

## 2023-11-11 DIAGNOSIS — M79652 Pain in left thigh: Secondary | ICD-10-CM | POA: Insufficient documentation

## 2023-11-11 DIAGNOSIS — Z5321 Procedure and treatment not carried out due to patient leaving prior to being seen by health care provider: Secondary | ICD-10-CM | POA: Diagnosis not present

## 2023-11-11 NOTE — ED Notes (Signed)
Pt called for vitals without answer

## 2023-11-11 NOTE — ED Triage Notes (Signed)
Pt. Stated, I fell this morning the chair went out from me and broke. Im having pain in the left leg, left hip and left knee pain. I had knee surgery in December.

## 2023-11-11 NOTE — ED Provider Triage Note (Signed)
Emergency Medicine Provider Triage Evaluation Note  Madison Welch , a 48 y.o. female  was evaluated in triage.  Pt complains of pain after fall.  Review of Systems  Positive: Pain in left hip, left thigh, left knee, left shin.  Mild pain in right forearm Negative: Headache, neck pain, loss conscious, chest pain, abdominal pain, back pain  Physical Exam  BP (!) 151/100 (BP Location: Right Arm)   Pulse 73   Temp 98.1 F (36.7 C)   Resp 16   Ht 5\' 3"  (1.6 m)   Wt (!) 147.4 kg   LMP 09/16/2012   SpO2 99%   BMI 57.57 kg/m  Gen:   Awake, no distress   Resp:  Normal effort  MSK:   Tenderness in left hip, left thigh, left shin, and left knee.  More pain with active movement versus passive movement.  Range of motion intact with passive movement.  No numbness or weakness. Other:    Medical Decision Making  Medically screening exam initiated at 11:48 AM.  Appropriate orders placed.  Madison Welch was informed that the remainder of the evaluation will be completed by another provider, this initial triage assessment does not replace that evaluation, and the importance of remaining in the ED until their evaluation is complete.   Madison Welch is a 48 y.o. female with a past medical history significant for asthma and recent left knee surgery just over a month ago who presents with fall.  According to patient, she was finally cleared from using her walker yesterday and is doing well.  She reports that today she had sat down to put away laundry after folding it and when the chair she was sitting in broke.  The front left leg broke causing her to fall towards the left side.  She did not hit her head and currently complaining of pain in her left hip, left thigh, left knee, left shin.  Denies numbness but reports she had pain moving it.  She denies any torso pain.  She did have some mild pain in her right forearm that is mild and not bothering her now.  No numbness or weakness reported.  This happened  this morning.  On exam, patient had some tenderness in the right hip right thigh and right knee.  Less tenderness in the shin or calf.  Intact pulse, strength, and sensation.  She had more pain with active movement as compared to passive movement of the knee.  Right side nontender.  Rest of exam unremarkable.  Mild tenderness in the right forearm but no significant bony tenderness.  Intact sensation strength and pulse in the arm.    Given patient's injuries, we will get x-rays of the hip, thigh, knee, and shin.  Will hold on x-ray of the forearm given her lack of significant pain.  Given lack of preceding symptoms although other workup.  Suspect musculoskeletal pain and injury on her recently operated on the left knee.  If imaging reassuring, dissipate discharge home with likely muscle skeletal pain.  Patient will await further evaluation in her exam room and images to be completed.         Charolett Yarrow, Canary Brim, MD 11/11/23 1200

## 2023-11-11 NOTE — ED Notes (Signed)
No answer x2 

## 2023-11-15 ENCOUNTER — Encounter: Payer: Self-pay | Admitting: Physical Therapy

## 2023-11-15 ENCOUNTER — Ambulatory Visit (INDEPENDENT_AMBULATORY_CARE_PROVIDER_SITE_OTHER): Payer: BC Managed Care – PPO | Admitting: Physical Therapy

## 2023-11-15 DIAGNOSIS — M25562 Pain in left knee: Secondary | ICD-10-CM | POA: Diagnosis not present

## 2023-11-15 DIAGNOSIS — R262 Difficulty in walking, not elsewhere classified: Secondary | ICD-10-CM | POA: Diagnosis not present

## 2023-11-15 DIAGNOSIS — R600 Localized edema: Secondary | ICD-10-CM

## 2023-11-15 DIAGNOSIS — M6281 Muscle weakness (generalized): Secondary | ICD-10-CM

## 2023-11-15 DIAGNOSIS — M25662 Stiffness of left knee, not elsewhere classified: Secondary | ICD-10-CM

## 2023-11-15 NOTE — Therapy (Signed)
OUTPATIENT PHYSICAL THERAPY LOWER EXTREMITY TREATMENT    Patient Name: Madison Welch MRN: [id number] DOB:Sep 06, 1976, 48 y.o., female Today's Date: 11/15/2023  END OF SESSION:  PT End of Session - 11/15/23 0928     Visit Number 8    Number of Visits 13    Date for PT Re-Evaluation 11/22/23    Authorization Type [employer]    Authorization Time Period 10/11/23 to 11/22/23    PT Start Time 0930    PT Stop Time 1008    PT Time Calculation (min) 38 min    Activity Tolerance Patient tolerated treatment well;Patient limited by pain    Behavior During Therapy [facility] for tasks assessed/performed                    Past Medical History:  Diagnosis Date   Asthma    Headache(784.0)    Legally blind    right eye   Obesity    Past Surgical History:  Procedure Laterality Date   ABDOMINAL HYSTERECTOMY     partial   CESAREAN SECTION     CYSTOSCOPY  09/21/2012   Procedure: CYSTOSCOPY;  Surgeon: Dorien Chihuahua. Richardson Dopp, MD;  Location: [facility];  Service: Gynecology;  Laterality: N/A;  with cystotomy repair   EYE SURGERY     KNEE ARTHROSCOPY WITH MEDIAL MENISECTOMY Left 09/30/2023   Procedure: LEFT KNEE ARTHROSCOPY WITH MENISCAL DEBRIDEMENT;  Surgeon: Cammy Copa, MD;  Location: [facility] OR;  Service: Orthopedics;  Laterality: Left;   LEG SURGERY     ROBOTIC ASSISTED LAPAROSCOPIC LYSIS OF ADHESION  09/21/2012   Procedure: ROBOTIC ASSISTED LAPAROSCOPIC LYSIS OF ADHESION;  Surgeon: Dorien Chihuahua. Richardson Dopp, MD;  Location: [facility];  Service: Gynecology;  Laterality: N/A;   ROBOTIC ASSISTED TOTAL HYSTERECTOMY  09/21/2012   Procedure: ROBOTIC ASSISTED TOTAL HYSTERECTOMY;  Surgeon: Dorien Chihuahua. Richardson Dopp, MD;  Location: [facility];  Service: Gynecology;  Laterality: N/A;   TUBAL LIGATION     Patient Active Problem List   Diagnosis Date Noted   Acute lateral meniscus tear of left knee 10/09/2023   Acute medial meniscus tear of left knee 10/09/2023   Loose body in knee, left knee 10/09/2023   Easy bruising 07/14/2019   Morbid  obesity with BMI of 50.0-59.9, adult (HCC) 07/14/2019   Menorrhagia 09/22/2012   Dysmenorrhea 09/22/2012    PCP: Renaye Rakers MD   REFERRING PROVIDER: Cammy Copa, MD  REFERRING DIAG: Diagnosis [phone] (ICD-10-CM) - S/P left knee arthroscopy  THERAPY DIAG:  Difficulty in walking, not elsewhere classified  Acute pain of left knee  Muscle weakness (generalized)  Localized edema  Stiffness of left knee, not elsewhere classified  Rationale for Evaluation and Treatment: Rehabilitation  ONSET DATE: L knee arthroscopy with meniscal debridement 09/30/23  SUBJECTIVE:   SUBJECTIVE STATEMENT: Larey Seat the other day when she went to sit in a chair and the chair broke.  Went to ED and xrays negative for fx.   EVAL: Have been feeling OK since surgery, doing some leg stretches and bends but it feels achey and stiff. Feeling shaky when I get in/out of the shower like the left is weak. Hurt the knee standing up quickly, jumped up and heard something pop. Its been swelling a little bit around the incisions, no drainage or redness that I've noticed from the incision. Knee doesn't feel sturdy yet but I'm trying to wean myself off of the walker.   PERTINENT HISTORY: See above  PAIN:  Are you having pain? Yes: NPRS scale:  7/10 now, over past week  6-7/10, can get up to 8/10 still  Pain location: R knee  Pain description: aching, cramping   Aggravating factors: walking/WB especially in first few steps, transfers, overdoing, stretching out knee    Relieving factors: movement , heat, ice    PRECAUTIONS: updated 10/29/23- ok for WBAT exercise as tolerated per discussion with referring MD, also legally blind R eye per PMH in Epic    RED FLAGS: None   WEIGHT BEARING RESTRICTIONS: No "MD told me to put as much weight as I can bear"   FALLS:  Has patient fallen in last 6 months? No  LIVING ENVIRONMENT: Lives with: lives with their family Lives in: House/apartment Stairs: 1 STE, bedroom  is upstairs but can stay on first level  Has following equipment at home: Environmental consultant - 2 wheeled  OCCUPATION: works with Hydrologist in corporate office- computer work, sedentary job   PLOF: Independent, Independent with basic ADLs, Independent with gait, and Independent with transfers  PATIENT GOALS: get back to favorite hobby (dancing- line dancing, swing, step dancing, etc), walk without limits/pain, be able to dance in cowboy boots by my birthday    OBJECTIVE:  Note: Objective measures were completed at Evaluation unless otherwise noted.  DIAGNOSTIC FINDINGS: Summary:  RIGHT:  - No evidence of common femoral vein obstruction.   LEFT:  - There is no evidence of deep vein thrombosis in the lower extremity.    - No cystic structure found in the popliteal fossa.     PATIENT SURVEYS:  FOTO 50, predicted 77 in 13 visits   COGNITION: Overall cognitive status: Within functional limits for tasks assessed     SENSATION: Not tested  EDEMA:  Appropriate for time since surgery (less than 2 weeks since time of eval)   LOWER EXTREMITY ROM:  Active ROM Right eval Left eval Left  10/18/23 Left 10/26/23 Left  10/29/23 Left 11/10/23  Knee flexion 120* 94* 110* 114 107* pain limited AROM  A:110 After heel slides: 113  Knee extension 2* 5* 5*  4* supine heel prop, 15* seated LAQ AROM   A: 4 (hyperextension; LAQ)   (Blank rows = not tested)  LOWER EXTREMITY MMT:  MMT Right eval Left eval  Hip flexion 5 3+  Hip abduction 4+ 3  Knee flexion 5 3- pain   Knee extension 5 3 pain   Ankle dorsiflexion 5 5   (Blank rows = not tested)    GAIT: Distance walked: in clinic distances  Assistive device utilized: Environmental consultant - 2 wheeled Level of assistance: Modified independence Comments: antalgic, limited ROM L knee though gait cycle, trendelenburg                                                                                                                                 TREATMENT  DATE:  11/15/23 TherEx Recumbent bike full revolutions no resistance, seat 5; x 8 minutes Leg  press 75# bil 3x10; LLE only 37# 3x10 Knee extension 10# x 14 reps; then decr to 5# 3x10; LLE only Knee flexion 15# 3x10; LLE only Standing hip abduction L3 band 2x10 bil; light UE support Standing hip extension L3 band 2x10 bil; light UE support Lt LAQ 4# 2x10  11/10/23 TherEx Nustep L6x8 minutes LAQs 4# 2x10; 2 sec hold AROM measurements -see above for details AA heelslides x 10 reps   Gait Amb with SPC with supervision and cues for sequencing; negotiated 6 stairs with single handrail and SPC with supervision and min cues for sequencing  11/04/23 TherEx Nustep L3x8 minutes (lowered resistance due to higher irritability of pain) LAQs 4# 12x2 second holds L LE   Manual Percussion gun to hamstring and calf in prone to address mm cramping and spasms   10/29/23 TherEx Nustep L6 x10 minutes for ROM, w/u, tissue perfusion STG update- see below  Bridges 2x10 cues for equal WB  STS with L LE slightly staggered back x10 elevated mat table no UEs  Shuttle leg press BLEs 2x12 50# Shuttle leg press single leg (L LE) 18# x10 Forward step ups 2 inch box x10 B cues to not hyperextend L LE in stance     10/26/23 TherEx NuStep L6 x 10 min for ROM and warm up Leg Press bil 50# 2x10 Lt LAQ 4# 2x10; 3 sec hold Bridges 2x10 with 5 sec hold; equal weight bearing Sit to/from stand from elevated mat table x10, no UE support ROM measurements - see above  10/18/23 TherEx Single LE bridge R LE 2x10 (NWB surgical LE) SLRs L LE 2x15 0# Side lying hip ABD x15 B 0# SLRs + ER 2x10  0# L LE Prone HS curls 3# 2x10  Prone hip extensions 3# 2x10  SAQs 3# 1x10, 4# 1x7  LAQs 3# 1x10, 4# 1x7   Manual Percussion gun to L thigh to reduce mm tension and pain x8 minutes low frequency for mm recovery    PATIENT EDUCATION:  Education details: exam findings, HEP, POC, don't force knee through painful  activities, early to try to wean RW or attempt multiple steps just yet, time needing to allow healing after surgery along with appropriate PT exercises, appropriate rate of exercise/activity progression post-op, heat vs cold and when/where to use each  Person educated: Patient Education method: Explanation, Demonstration, and Handouts Education comprehension: verbalized understanding, returned demonstration, and needs further education  HOME EXERCISE PROGRAM: Access Code: [email or web address] URL: [email or web address] Date: 10/11/2023 Prepared by: Nedra Hai  Exercises - Supine Short Arc Quad  - 2 x daily - 7 x weekly - 1 sets - 10 reps - 2-3 seconds hold - Active Straight Leg Raise with Quad Set  - 2 x daily - 7 x weekly - 1 sets - 10 reps - Supine Heel Slide  - 2 x daily - 7 x weekly - 2 sets - 10 reps - 3 seconds  hold - Sidelying Hip Abduction  - 2 x daily - 7 x weekly - 1 sets - 10 reps - Seated Long Arc Quad  - 2 x daily - 7 x weekly - 1 sets - 10 reps - 2-3 seconds  hold - Seated Hamstring Stretch  - 2 x daily - 7 x weekly - 1 sets - 2 reps - 30 seconds  hold  ASSESSMENT:  CLINICAL IMPRESSION: Progressed strengthening exercises today with expected muscle fatigue reported.  She does have some soreness from recent fall but feel this should resolve over  time.  Will continue to benefit from PT to maximize function.   EVAL: Patient is a 48 y.o. F who was seen today for physical therapy evaluation and treatment for Diagnosis Z98.890 (ICD-10-CM) - S/P left knee arthroscopy. Exam as expected and typical without significant complications noted. Should be able to progress well with skilled PT services.    OBJECTIVE IMPAIRMENTS: Abnormal gait, decreased activity tolerance, decreased coordination, decreased knowledge of use of DME, decreased mobility, difficulty walking, decreased ROM, decreased strength, increased edema, increased fascial restrictions, increased muscle spasms, impaired  flexibility, obesity, and pain.   ACTIVITY LIMITATIONS: standing, squatting, sleeping, stairs, transfers, bed mobility, and locomotion level  PARTICIPATION LIMITATIONS: driving, shopping, community activity, occupation, and yard work  PERSONAL FACTORS: Age, Behavior pattern, Education, Fitness, Past/current experiences, and Time since onset of injury/illness/exacerbation are also affecting patient's functional outcome.   REHAB POTENTIAL: Good  CLINICAL DECISION MAKING: Stable/uncomplicated  EVALUATION COMPLEXITY: Low   GOALS: Goals reviewed with patient? No  SHORT TERM GOALS: Target date: 11/01/2023   Will be compliant with appropriate progressive HEP  Baseline: Goal status: MET 10/29/23  2.  Pain to be no more than 6/10 at worst  Baseline:  Goal status: ONGOING 10/29/23  3.  Will be independent with edema management strategies such as ice/elevation Baseline:  Goal status: ONGOING 10/1023  4.  L knee AROM to be 0* extension, at least 115* flexion  Baseline:  Goal status: ONGOING 10/29/23   LONG TERM GOALS: Target date: 11/22/2023    MMT to have improved by at least one grade all tested groups in L LE  Baseline:  Goal status: INITIAL  2.  Pain to be no more than 3/10 at worst  Baseline:  Goal status: INITIAL  3.  Will be able to ambulate community distances no device no increase from resting pain levels with WNL gait mechanics  Baseline:  Goal status: INITIAL  4.  Will be able to navigate steps with reciprocal pattern, no increase in pain  Baseline:  Goal status: INITIAL  5.  Will have been able to return to dancing (low impact styles at first) with no increase in pain  Baseline:  Goal status: INITIAL  6.  FOTO score to be within 5 points of predicted value by time of DC  Baseline:  Goal status: INITIAL   PLAN:  PT FREQUENCY: 2x/week  PT DURATION: 6 weeks  PLANNED INTERVENTIONS: 97164- PT Re-evaluation, 97110-Therapeutic exercises, 97530- Therapeutic  activity, 97112- Neuromuscular re-education, 97535- Self Care, 16109- Manual therapy, [id number]- Gait training, [id number]- Aquatic Therapy, 97014- Electrical stimulation (unattended), 97016- Vasopneumatic device, [id number]- Ionotophoresis 4mg /ml Dexamethasone, Taping, Dry Needling, Cryotherapy, and Moist heat  PLAN FOR NEXT SESSION: progress as appropriate/tolerated, needs more closed chain exercises and HEP updated.  NEXT MD VISIT: 12/20/23 with surgeon   Clarita Crane, PT, DPT 11/15/23 1:07 PM

## 2023-11-17 ENCOUNTER — Encounter: Payer: Self-pay | Admitting: Physical Therapy

## 2023-11-17 ENCOUNTER — Other Ambulatory Visit: Payer: Self-pay | Admitting: Surgical

## 2023-11-17 ENCOUNTER — Ambulatory Visit (INDEPENDENT_AMBULATORY_CARE_PROVIDER_SITE_OTHER): Payer: BC Managed Care – PPO | Admitting: Physical Therapy

## 2023-11-17 DIAGNOSIS — M25562 Pain in left knee: Secondary | ICD-10-CM

## 2023-11-17 DIAGNOSIS — R262 Difficulty in walking, not elsewhere classified: Secondary | ICD-10-CM | POA: Diagnosis not present

## 2023-11-17 DIAGNOSIS — M6281 Muscle weakness (generalized): Secondary | ICD-10-CM

## 2023-11-17 DIAGNOSIS — R600 Localized edema: Secondary | ICD-10-CM

## 2023-11-17 DIAGNOSIS — M25662 Stiffness of left knee, not elsewhere classified: Secondary | ICD-10-CM

## 2023-11-17 MED ORDER — OXYCODONE HCL 5 MG PO TABS: 5.0000 mg | ORAL_TABLET | Freq: Every day | ORAL | 0 refills | Status: DC | PRN

## 2023-11-17 NOTE — Therapy (Signed)
OUTPATIENT PHYSICAL THERAPY LOWER EXTREMITY TREATMENT/ PROGRESS NOTE AND RECERT    Patient Name: Ryker Pherigo MRN: [id number] DOB:1976/09/02, 48 y.o., female Today's Date: 11/17/2023  END OF SESSION:  PT End of Session - 11/17/23 0939     Visit Number 9    Number of Visits 21    Date for PT Re-Evaluation 12/29/23    Authorization Type [employer]    Authorization Time Period 10/11/23 to 11/22/23; recerted to 12/29/23    PT Start Time 0932    PT Stop Time 1012    PT Time Calculation (min) 40 min    Activity Tolerance Patient tolerated treatment well    Behavior During Therapy [facility] for tasks assessed/performed                     Past Medical History:  Diagnosis Date   Asthma    Headache(784.0)    Legally blind    right eye   Obesity    Past Surgical History:  Procedure Laterality Date   ABDOMINAL HYSTERECTOMY     partial   CESAREAN SECTION     CYSTOSCOPY  09/21/2012   Procedure: CYSTOSCOPY;  Surgeon: Dorien Chihuahua. Richardson Dopp, MD;  Location: [facility];  Service: Gynecology;  Laterality: N/A;  with cystotomy repair   EYE SURGERY     KNEE ARTHROSCOPY WITH MEDIAL MENISECTOMY Left 09/30/2023   Procedure: LEFT KNEE ARTHROSCOPY WITH MENISCAL DEBRIDEMENT;  Surgeon: Cammy Copa, MD;  Location: [facility] OR;  Service: Orthopedics;  Laterality: Left;   LEG SURGERY     ROBOTIC ASSISTED LAPAROSCOPIC LYSIS OF ADHESION  09/21/2012   Procedure: ROBOTIC ASSISTED LAPAROSCOPIC LYSIS OF ADHESION;  Surgeon: Dorien Chihuahua. Richardson Dopp, MD;  Location: [facility];  Service: Gynecology;  Laterality: N/A;   ROBOTIC ASSISTED TOTAL HYSTERECTOMY  09/21/2012   Procedure: ROBOTIC ASSISTED TOTAL HYSTERECTOMY;  Surgeon: Dorien Chihuahua. Richardson Dopp, MD;  Location: [facility];  Service: Gynecology;  Laterality: N/A;   TUBAL LIGATION     Patient Active Problem List   Diagnosis Date Noted   Acute lateral meniscus tear of left knee 10/09/2023   Acute medial meniscus tear of left knee 10/09/2023   Loose body in knee, left knee 10/09/2023   Easy  bruising 07/14/2019   Morbid obesity with BMI of 50.0-59.9, adult (HCC) 07/14/2019   Menorrhagia 09/22/2012   Dysmenorrhea 09/22/2012    PCP: Renaye Rakers MD   REFERRING PROVIDER: Cammy Copa, MD  REFERRING DIAG: Diagnosis [phone] (ICD-10-CM) - S/P left knee arthroscopy  THERAPY DIAG:  Difficulty in walking, not elsewhere classified  Acute pain of left knee  Muscle weakness (generalized)  Localized edema  Stiffness of left knee, not elsewhere classified  Rationale for Evaluation and Treatment: Rehabilitation  ONSET DATE: L knee arthroscopy with meniscal debridement 09/30/23  SUBJECTIVE:   SUBJECTIVE STATEMENT:  Came without my cane or walker today, I want to progress away from it. Had that fall recently. Nothing else new.   EVAL: Have been feeling OK since surgery, doing some leg stretches and bends but it feels achey and stiff. Feeling shaky when I get in/out of the shower like the left is weak. Hurt the knee standing up quickly, jumped up and heard something pop. Its been swelling a little bit around the incisions, no drainage or redness that I've noticed from the incision. Knee doesn't feel sturdy yet but I'm trying to wean myself off of the walker.   PERTINENT HISTORY: See above  PAIN:  Are you having pain? Yes:  NPRS scale: 7/10 nowl  Pain location: R knee  Pain description: stiffness    Aggravating factors: morning stiffness Relieving factors: movement , heat, ice    PRECAUTIONS: updated 10/29/23- ok for WBAT exercise as tolerated per discussion with referring MD, also legally blind R eye per PMH in Epic    RED FLAGS: None   WEIGHT BEARING RESTRICTIONS: No "MD told me to put as much weight as I can bear"   FALLS:  Has patient fallen in last 6 months? 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  DIAGNOSTIC FINDINGS: Summary:  RIGHT:  - No evidence of common femoral vein obstruction.   LEFT:  - There is no evidence of deep vein thrombosis in the lower extremity.    - No cystic structure found in the popliteal fossa.     PATIENT SURVEYS:  FOTO 50, predicted 1 in 13 visits; 11/17/23-  60.4%   COGNITION: Overall cognitive status: Within functional limits for tasks assessed     SENSATION: Not tested  EDEMA:  Appropriate for time since surgery (less than 2 weeks since time of eval)   LOWER EXTREMITY ROM:  Active ROM Right eval Left eval Left  10/18/23 Left 10/26/23 Left  10/29/23 Left 11/10/23 Left 11/17/23  Knee flexion 120* 94* 110* 114 107* pain limited AROM  A:110 After heel slides: 113 AROM 108* supine heel slide   Knee extension 2* 5* 5*  4* supine heel prop, 15* seated LAQ AROM   A: 4 (hyperextension; LAQ) AROM 2* with LAQ    (Blank rows = not tested)  LOWER EXTREMITY MMT:  MMT Right eval Left eval Left 11/17/23  Hip flexion 5 3+ 4-  Hip abduction 4+ 3 3  Knee flexion 5 3- pain  4-  Knee extension 5 3 pain  4-  Ankle dorsiflexion 5 5    (Blank rows = not tested)    GAIT: Distance walked: in clinic distances  Assistive device utilized: Environmental consultant - 2 wheeled Level of assistance: Modified independence Comments: antalgic, limited ROM L knee though gait cycle, trendelenburg                                                                                                                                  TREATMENT DATE:    11/17/23  FOTO, objective measures as above, education on progress  with PT and POC moving forward    TherEx  Nustep L6 x8  minutes all 4 extremities, w/u and tissue perfusion LAQs 4# 2x15 L LE  SLRs 0# x20 LLE SLRs 0# + ER x15       11/15/23 TherEx Recumbent bike full revolutions no resistance, seat 5; x 8 minutes Leg press 75# bil 3x10; LLE only 37# 3x10 Knee extension 10# x 14 reps; then decr to 5# 3x10; LLE only Knee flexion 15# 3x10; LLE only Standing hip abduction L3 band 2x10 bil; light UE support Standing hip extension L3 band 2x10 bil; light UE support Lt LAQ 4# 2x10  11/10/23 TherEx Nustep L6x8 minutes LAQs 4# 2x10; 2 sec hold AROM measurements -see above for details AA heelslides x 10 reps   Gait Amb with SPC with supervision and cues for sequencing; negotiated 6 stairs with single handrail and SPC with supervision and min cues for sequencing  11/04/23 TherEx Nustep L3x8 minutes (lowered resistance due to higher irritability of pain) LAQs 4# 12x2 second holds L LE   Manual Percussion gun to hamstring and calf in prone to address mm cramping and spasms   10/29/23 TherEx Nustep L6 x10 minutes for ROM, w/u, tissue perfusion STG update- see below  Bridges 2x10 cues for equal WB  STS with L LE slightly staggered back x10 elevated mat table no UEs  Shuttle leg press BLEs 2x12 50# Shuttle leg press single leg (L LE) 18# x10 Forward step ups 2 inch box x10 B cues to not hyperextend L LE in stance     10/26/23 TherEx NuStep L6 x 10 min for ROM and warm up Leg Press bil 50# 2x10 Lt LAQ 4# 2x10; 3 sec hold Bridges 2x10 with 5 sec hold; equal weight bearing Sit to/from stand from elevated mat table x10, no UE support ROM measurements - see above  10/18/23 TherEx Single LE bridge R LE 2x10 (NWB surgical LE) SLRs L LE 2x15 0# Side lying hip ABD x15 B 0# SLRs + ER 2x10  0# L LE Prone HS curls 3# 2x10  Prone hip extensions  3# 2x10  SAQs 3# 1x10, 4# 1x7  LAQs 3# 1x10, 4# 1x7   Manual Percussion gun to L thigh to reduce mm tension and pain x8 minutes low frequency for mm recovery    PATIENT EDUCATION:  Education details: exam findings, HEP, POC, don't force knee through painful activities, early to try to wean RW or attempt multiple steps just yet, time needing to allow healing after surgery along with appropriate PT exercises, appropriate rate of exercise/activity progression post-op, heat vs cold and when/where to use each  Person educated: Patient Education method: Explanation, Demonstration, and Handouts Education comprehension: verbalized understanding, returned demonstration, and needs further education  HOME EXERCISE PROGRAM: Access Code: [email or web address] URL: [email or web address] Date: 10/11/2023 Prepared by: Nedra Hai  Exercises - Supine Short Arc Quad  - 2 x daily - 7 x weekly - 1 sets - 10 reps - 2-3 seconds hold - Active Straight Leg Raise with Quad Set  - 2 x daily - 7 x weekly - 1 sets - 10 reps - Supine Heel Slide  - 2 x daily - 7 x weekly - 2 sets - 10 reps - 3 seconds  hold - Sidelying Hip Abduction  - 2 x daily - 7 x weekly - 1 sets - 10 reps - Seated Long Arc Quad  - 2 x daily - 7 x weekly - 1 sets - 10 reps -  2-3 seconds  hold - Seated Hamstring Stretch  - 2 x daily - 7 x weekly - 1 sets - 2 reps - 30 seconds  hold  ASSESSMENT:  CLINICAL IMPRESSION:  Focused session on progress note and recert- making slow but steady progress with skilled PT services. Now walking without device but continues to have high pain levels- will definitely benefit greatly from skilled PT services to continue working on strength with goal of return to optimal level of function.    EVAL: Patient is a 48 y.o. F who was seen today for physical therapy evaluation and treatment for Diagnosis Z98.890 (ICD-10-CM) - S/P left knee arthroscopy. Exam as expected and typical without significant complications  noted. Should be able to progress well with skilled PT services.    OBJECTIVE IMPAIRMENTS: Abnormal gait, decreased activity tolerance, decreased coordination, decreased knowledge of use of DME, decreased mobility, difficulty walking, decreased ROM, decreased strength, increased edema, increased fascial restrictions, increased muscle spasms, impaired flexibility, obesity, and pain.   ACTIVITY LIMITATIONS: standing, squatting, sleeping, stairs, transfers, bed mobility, and locomotion level  PARTICIPATION LIMITATIONS: driving, shopping, community activity, occupation, and yard work  PERSONAL FACTORS: Age, Behavior pattern, Education, Fitness, Past/current experiences, and Time since onset of injury/illness/exacerbation are also affecting patient's functional outcome.   REHAB POTENTIAL: Good  CLINICAL DECISION MAKING: Stable/uncomplicated  EVALUATION COMPLEXITY: Low   GOALS: Goals reviewed with patient? No  SHORT TERM GOALS: Target date: 12/08/2023     Will be compliant with appropriate progressive HEP  Baseline: Goal status: MET 10/29/23  2.  Pain to be no more than 6/10 at worst  Baseline:  Goal status: ONGOING 11/17/23 7/10  3.  Will be independent with edema management strategies such as ice/elevation Baseline:  Goal status: MET 11/17/23   4.  L knee AROM to be 0* extension, at least 115* flexion  Baseline:  Goal status: ONGOING 11/17/23 but progressing nicely (see tables above)   LONG TERM GOALS: Target date: 12/29/2023      MMT to have improved by at least one grade all tested groups in L LE  Baseline:  Goal status: ONGOING 11/17/23 improving nicely   2.  Pain to be no more than 3/10 at worst  Baseline:  Goal status: ONGOING 11/17/23  3.  Will be able to ambulate community distances no device no increase from resting pain levels with WNL gait mechanics  Baseline:  Goal status: ONGOING 11/17/23  4.  Will be able to navigate steps with reciprocal pattern, no  increase in pain  Baseline:  Goal status: ONGOING 11/17/23  5.  Will have been able to return to dancing (low impact styles at first) with no increase in pain  Baseline:  Goal status: ONGOING 11/17/23  6.  FOTO score to be within 5 points of predicted value by time of DC  Baseline:  Goal status: ONGOING 11/17/23   PLAN:  PT FREQUENCY: 2x/week  PT DURATION: 6 weeks  PLANNED INTERVENTIONS: 97164- PT Re-evaluation, 97110-Therapeutic exercises, 97530- Therapeutic activity, 97112- Neuromuscular re-education, 97535- Self Care, [id number]- Manual therapy, [id number]- Gait training, [id number]- Aquatic Therapy, 97014- Electrical stimulation (unattended), 97016- Vasopneumatic device, [id number]- Ionotophoresis 4mg /ml Dexamethasone, Taping, Dry Needling, Cryotherapy, and Moist heat  PLAN FOR NEXT SESSION: progress as appropriate/tolerated, needs more closed chain exercises and HEP update  NEXT MD VISIT: 12/20/23 with surgeon    Nedra Hai, PT, DPT 11/17/23 10:20 AM

## 2023-11-24 ENCOUNTER — Encounter: Payer: BC Managed Care – PPO | Admitting: Rehabilitative and Restorative Service Providers"

## 2023-11-30 ENCOUNTER — Ambulatory Visit (INDEPENDENT_AMBULATORY_CARE_PROVIDER_SITE_OTHER): Payer: BC Managed Care – PPO | Admitting: Physical Therapy

## 2023-11-30 ENCOUNTER — Encounter: Payer: Self-pay | Admitting: Physical Therapy

## 2023-11-30 DIAGNOSIS — R600 Localized edema: Secondary | ICD-10-CM

## 2023-11-30 DIAGNOSIS — M25662 Stiffness of left knee, not elsewhere classified: Secondary | ICD-10-CM

## 2023-11-30 DIAGNOSIS — M25562 Pain in left knee: Secondary | ICD-10-CM

## 2023-11-30 DIAGNOSIS — M6281 Muscle weakness (generalized): Secondary | ICD-10-CM

## 2023-11-30 DIAGNOSIS — R262 Difficulty in walking, not elsewhere classified: Secondary | ICD-10-CM

## 2023-11-30 NOTE — Therapy (Signed)
OUTPATIENT PHYSICAL THERAPY LOWER EXTREMITY TREATMENT/ PROGRESS NOTE AND RECERT    Patient Name: Madison Welch MRN: [id number] DOB:July 18, 1976, 48 y.o., female Today's Date: 11/30/2023  END OF SESSION:  PT End of Session - 11/30/23 1107     Visit Number 10    Number of Visits 21    Date for PT Re-Evaluation 12/29/23    Authorization Type [employer]    Authorization Time Period 10/11/23 to 11/22/23; recerted to 12/29/23    PT Start Time 1103    PT Stop Time 1143    PT Time Calculation (min) 40 min    Activity Tolerance Patient tolerated treatment well    Behavior During Therapy [facility] for tasks assessed/performed               Past Medical History:  Diagnosis Date   Asthma    Headache(784.0)    Legally blind    right eye   Obesity    Past Surgical History:  Procedure Laterality Date   ABDOMINAL HYSTERECTOMY     partial   CESAREAN SECTION     CYSTOSCOPY  09/21/2012   Procedure: CYSTOSCOPY;  Surgeon: Dorien Chihuahua. Richardson Dopp, MD;  Location: [facility];  Service: Gynecology;  Laterality: N/A;  with cystotomy repair   EYE SURGERY     KNEE ARTHROSCOPY WITH MEDIAL MENISECTOMY Left 09/30/2023   Procedure: LEFT KNEE ARTHROSCOPY WITH MENISCAL DEBRIDEMENT;  Surgeon: Cammy Copa, MD;  Location: [facility] OR;  Service: Orthopedics;  Laterality: Left;   LEG SURGERY     ROBOTIC ASSISTED LAPAROSCOPIC LYSIS OF ADHESION  09/21/2012   Procedure: ROBOTIC ASSISTED LAPAROSCOPIC LYSIS OF ADHESION;  Surgeon: Dorien Chihuahua. Richardson Dopp, MD;  Location: [facility];  Service: Gynecology;  Laterality: N/A;   ROBOTIC ASSISTED TOTAL HYSTERECTOMY  09/21/2012   Procedure: ROBOTIC ASSISTED TOTAL HYSTERECTOMY;  Surgeon: Dorien Chihuahua. Richardson Dopp, MD;  Location: [facility];  Service: Gynecology;  Laterality: N/A;   TUBAL LIGATION     Patient Active Problem List   Diagnosis Date Noted   Acute lateral meniscus tear of left knee 10/09/2023   Acute medial meniscus tear of left knee 10/09/2023   Loose body in knee, left knee 10/09/2023   Easy bruising  07/14/2019   Morbid obesity with BMI of 50.0-59.9, adult (HCC) 07/14/2019   Menorrhagia 09/22/2012   Dysmenorrhea 09/22/2012    PCP: Renaye Rakers MD   REFERRING PROVIDER: Cammy Copa, MD  REFERRING DIAG: Diagnosis [phone] (ICD-10-CM) - S/P left knee arthroscopy  THERAPY DIAG:  Difficulty in walking, not elsewhere classified  Acute pain of left knee  Muscle weakness (generalized)  Localized edema  Stiffness of left knee, not elsewhere classified  Rationale for Evaluation and Treatment: Rehabilitation  ONSET DATE: L knee arthroscopy with meniscal debridement 09/30/23  SUBJECTIVE:   SUBJECTIVE STATEMENT: Pt reporting more stiffness than pain however reporting pain with certain movements 4-5/10.     EVAL: Have been feeling OK since surgery, doing some leg stretches and bends but it feels achey and stiff. Feeling shaky when I get in/out of the shower like the left is weak. Hurt the knee standing up quickly, jumped up and heard something pop. Its been swelling a little bit around the incisions, no drainage or redness that I've noticed from the incision. Knee doesn't feel sturdy yet but I'm trying to wean myself off of the walker.   PERTINENT HISTORY: See above  PAIN:  Are you having pain? Yes: NPRS scale: 4-5/10 Pain location: R knee  Pain description: stiffness  Aggravating factors: morning stiffness Relieving factors: movement , heat, ice    PRECAUTIONS: updated 10/29/23- ok for WBAT exercise as tolerated per discussion with referring MD, also legally blind R eye per PMH in Epic    RED FLAGS: None   WEIGHT BEARING RESTRICTIONS: No "MD told me to put as much weight as I can bear"   FALLS:  Has patient fallen in last 6 months? No  LIVING ENVIRONMENT: Lives with: lives with their family Lives in: House/apartment Stairs: 1 STE, bedroom is upstairs but can stay on first level  Has following equipment at home: Environmental consultant - 2 wheeled  OCCUPATION: works with  Hydrologist in corporate office- computer work, sedentary job   PLOF: Independent, Independent with basic ADLs, Independent with gait, and Independent with transfers  PATIENT GOALS: get back to favorite hobby (dancing- line dancing, swing, step dancing, etc), walk without limits/pain, be able to dance in cowboy boots by my birthday    OBJECTIVE:  Note: Objective measures were completed at Evaluation unless otherwise noted.  DIAGNOSTIC FINDINGS: Summary:  RIGHT:  - No evidence of common femoral vein obstruction.   LEFT:  - There is no evidence of deep vein thrombosis in the lower extremity.    - No cystic structure found in the popliteal fossa.     PATIENT SURVEYS:  FOTO 50, predicted 38 in 13 visits; 11/17/23-  60.4%   COGNITION: Overall cognitive status: Within functional limits for tasks assessed     SENSATION: Not tested  EDEMA:  Appropriate for time since surgery (less than 2 weeks since time of eval)   LOWER EXTREMITY ROM:  Active ROM Right eval Left eval Left  10/18/23 Left 10/26/23 Left  10/29/23 Left 11/10/23 Left 11/17/23 Left 11/30/23  Knee flexion 120* 94* 110* 114 107* pain limited AROM  A:110 After heel slides: 113 AROM 108* supine heel slide  AROM supine  Knee extension 2* 5* 5*  4* supine heel prop, 15* seated LAQ AROM   A: 4 (hyperextension; LAQ) AROM 2* with LAQ     (Blank rows = not tested)  LOWER EXTREMITY MMT:  MMT Right eval Left eval Left 11/17/23  Hip flexion 5 3+ 4-  Hip abduction 4+ 3 3  Knee flexion 5 3- pain  4-  Knee extension 5 3 pain  4-  Ankle dorsiflexion 5 5    (Blank rows = not tested)    GAIT: Distance walked: in clinic distances  Assistive device utilized: Environmental consultant - 2 wheeled Level of assistance: Modified independence Comments: antalgic, limited ROM L knee though gait cycle, trendelenburg                                                                                                                                  TREATMENT DATE:  11/30/23: TherEx Scifit bike: level 4, seat at 11 x 8 minutes Leg press 87# bil 3 x 10; LLE only 43# 2  x 10 Knee extension bil LE lifting, left LE eccentric lowering 5# 2 x 10  Knee flexion 15# 2 x 10; LLE only Lt LAQ 4# 2x10 Calf stretch on incline board: x 2 holding 30 sec NMR:  Standing TKE green TB 2 x 10  Sit to stand: 2 x 10 from mat table in lowest position c UE support    11/17/23 FOTO, objective measures as above, education on progress with PT and POC moving forward   TherEx Nustep L6 x8  minutes all 4 extremities, w/u and tissue perfusion LAQs 4# 2x15 L LE  SLRs 0# x20 LLE SLRs 0# + ER x15     11/15/23 TherEx Recumbent bike full revolutions no resistance, seat 5; x 8 minutes Leg press 75# bil 3x10; LLE only 37# 3x10 Knee extension 10# x 14 reps; then decr to 5# 3x10; LLE only Knee flexion 15# 3x10; LLE only Standing hip abduction L3 band 2x10 bil; light UE support Standing hip extension L3 band 2x10 bil; light UE support Lt LAQ 4# 2x10  11/10/23 TherEx Nustep L6x8 minutes LAQs 4# 2x10; 2 sec hold AROM measurements -see above for details AA heelslides x 10 reps   Gait Amb with SPC with supervision and cues for sequencing; negotiated 6 stairs with single handrail and SPC with supervision and min cues for sequencing  11/04/23 TherEx Nustep L3x8 minutes (lowered resistance due to higher irritability of pain) LAQs 4# 12x2 second holds L LE   Manual Percussion gun to hamstring and calf in prone to address mm cramping and spasms   10/29/23 TherEx Nustep L6 x10 minutes for ROM, w/u, tissue perfusion STG update- see below  Bridges 2x10 cues for equal WB  STS with L LE slightly staggered back x10 elevated mat table no UEs  Shuttle leg press BLEs 2x12 50# Shuttle leg press single leg (L LE) 18# x10 Forward step ups 2 inch box x10 B cues to not hyperextend L LE in stance      PATIENT EDUCATION:  Education details: exam findings, HEP,  POC, don't force knee through painful activities, early to try to wean RW or attempt multiple steps just yet, time needing to allow healing after surgery along with appropriate PT exercises, appropriate rate of exercise/activity progression post-op, heat vs cold and when/where to use each  Person educated: Patient Education method: Explanation, Demonstration, and Handouts Education comprehension: verbalized understanding, returned demonstration, and needs further education  HOME EXERCISE PROGRAM: Access Code: [email or web address] URL: [email or web address] Date: 10/11/2023 Prepared by: Nedra Hai  Exercises - Supine Short Arc Quad  - 2 x daily - 7 x weekly - 1 sets - 10 reps - 2-3 seconds hold - Active Straight Leg Raise with Quad Set  - 2 x daily - 7 x weekly - 1 sets - 10 reps - Supine Heel Slide  - 2 x daily - 7 x weekly - 2 sets - 10 reps - 3 seconds  hold - Sidelying Hip Abduction  - 2 x daily - 7 x weekly - 1 sets - 10 reps - Seated Long Arc Quad  - 2 x daily - 7 x weekly - 1 sets - 10 reps - 2-3 seconds  hold - Seated Hamstring Stretch  - 2 x daily - 7 x weekly - 1 sets - 2 reps - 30 seconds  hold  ASSESSMENT:  CLINICAL IMPRESSION: Pt reporting 4-5/10 pain with activities. Pt still reporting lack of left knee extension when standing. Pt stating she experiencing the sensation  of her left knee buckling at times. Pt stating she is using more heat at home for pain with intermittent use of ice as needed.  Pt tolerating all exercises well. Continued skilled PT interventions.    EVAL: Patient is a 48 y.o. F who was seen today for physical therapy evaluation and treatment for Diagnosis Z98.890 (ICD-10-CM) - S/P left knee arthroscopy. Exam as expected and typical without significant complications noted. Should be able to progress well with skilled PT services.    OBJECTIVE IMPAIRMENTS: Abnormal gait, decreased activity tolerance, decreased coordination, decreased knowledge of use of  DME, decreased mobility, difficulty walking, decreased ROM, decreased strength, increased edema, increased fascial restrictions, increased muscle spasms, impaired flexibility, obesity, and pain.   ACTIVITY LIMITATIONS: standing, squatting, sleeping, stairs, transfers, bed mobility, and locomotion level  PARTICIPATION LIMITATIONS: driving, shopping, community activity, occupation, and yard work  PERSONAL FACTORS: Age, Behavior pattern, Education, Fitness, Past/current experiences, and Time since onset of injury/illness/exacerbation are also affecting patient's functional outcome.   REHAB POTENTIAL: Good  CLINICAL DECISION MAKING: Stable/uncomplicated  EVALUATION COMPLEXITY: Low   GOALS: Goals reviewed with patient? No  SHORT TERM GOALS: Target date: 12/08/2023     Will be compliant with appropriate progressive HEP  Baseline: Goal status: MET 10/29/23  2.  Pain to be no more than 6/10 at worst  Baseline:  Goal status: ONGOING 11/30/23  more at end of day or walking at store  3.  Will be independent with edema management strategies such as ice/elevation Baseline:  Goal status: MET 11/17/23   4.  L knee AROM to be 0* extension, at least 115* flexion  Baseline:  Goal status: ONGOING 11/17/23 but progressing nicely (see tables above)   LONG TERM GOALS: Target date: 12/29/2023      MMT to have improved by at least one grade all tested groups in L LE  Baseline:  Goal status: ONGOING 11/17/23 improving nicely   2.  Pain to be no more than 3/10 at worst  Baseline:  Goal status: ONGOING 11/17/23  3.  Will be able to ambulate community distances no device no increase from resting pain levels with WNL gait mechanics  Baseline:  Goal status: ONGOING 11/17/23  4.  Will be able to navigate steps with reciprocal pattern, no increase in pain  Baseline:  Goal status: ONGOING 11/17/23  5.  Will have been able to return to dancing (low impact styles at first) with no increase in pain   Baseline:  Goal status: ONGOING 11/17/23  6.  FOTO score to be within 5 points of predicted value by time of DC  Baseline:  Goal status: ONGOING 11/17/23   PLAN:  PT FREQUENCY: 2x/week  PT DURATION: 6 weeks  PLANNED INTERVENTIONS: 97164- PT Re-evaluation, 97110-Therapeutic exercises, 97530- Therapeutic activity, 97112- Neuromuscular re-education, 97535- Self Care, 14782- Manual therapy, [id number]- Gait training, [phone]- Aquatic Therapy, 97014- Electrical stimulation (unattended), 97016- Vasopneumatic device, [id number]- Ionotophoresis 4mg /ml Dexamethasone, Taping, Dry Needling, Cryotherapy, and Moist heat  PLAN FOR NEXT SESSION: progress as appropriate/tolerated, needs more closed chain exercises and HEP update  NEXT MD VISIT: 12/20/23 with surgeon    Narda Amber, PT, MPT 11/30/23 11:40 AM   11/30/23 11:40 AM

## 2023-12-02 ENCOUNTER — Encounter: Payer: Self-pay | Admitting: Rehabilitative and Restorative Service Providers"

## 2023-12-02 ENCOUNTER — Ambulatory Visit: Payer: BC Managed Care – PPO | Admitting: Rehabilitative and Restorative Service Providers"

## 2023-12-02 DIAGNOSIS — M25562 Pain in left knee: Secondary | ICD-10-CM

## 2023-12-02 DIAGNOSIS — R600 Localized edema: Secondary | ICD-10-CM

## 2023-12-02 DIAGNOSIS — R262 Difficulty in walking, not elsewhere classified: Secondary | ICD-10-CM

## 2023-12-02 DIAGNOSIS — M6281 Muscle weakness (generalized): Secondary | ICD-10-CM

## 2023-12-02 DIAGNOSIS — M25662 Stiffness of left knee, not elsewhere classified: Secondary | ICD-10-CM

## 2023-12-02 NOTE — Therapy (Addendum)
 OUTPATIENT PHYSICAL THERAPY LOWER EXTREMITY TREATMENT NOTE    Patient Name: Madison Welch MRN: [id number] DOB:Feb 25, 1976, 48 y.o., female Today's Date: 09/12/2024  PHYSICAL THERAPY DISCHARGE SUMMARY  Visits from Start of Care: 11  Current functional level related to goals / functional outcomes: See note   Remaining deficits: See note   Education / Equipment: HEP   Patient agrees to discharge. Patient goals were partially met. Patient is being discharged due to not returning since the last visit.   END OF SESSION:     Past Medical History:  Diagnosis Date   Asthma    Headache(784.0)    Legally blind    right eye   Obesity    Past Surgical History:  Procedure Laterality Date   ABDOMINAL HYSTERECTOMY     partial   CESAREAN SECTION     CYSTOSCOPY  09/21/2012   Procedure: CYSTOSCOPY;  Surgeon: Rexene PARAS. Rosalva, MD;  Location: [facility];  Service: Gynecology;  Laterality: N/A;  with cystotomy repair   EYE SURGERY     KNEE ARTHROSCOPY WITH MEDIAL MENISECTOMY Left 09/30/2023   Procedure: LEFT KNEE ARTHROSCOPY WITH MENISCAL DEBRIDEMENT;  Surgeon: Addie Cordella Hamilton, MD;  Location: [facility] OR;  Service: Orthopedics;  Laterality: Left;   LEG SURGERY     ROBOTIC ASSISTED LAPAROSCOPIC LYSIS OF ADHESION  09/21/2012   Procedure: ROBOTIC ASSISTED LAPAROSCOPIC LYSIS OF ADHESION;  Surgeon: Rexene PARAS. Rosalva, MD;  Location: [facility];  Service: Gynecology;  Laterality: N/A;   ROBOTIC ASSISTED TOTAL HYSTERECTOMY  09/21/2012   Procedure: ROBOTIC ASSISTED TOTAL HYSTERECTOMY;  Surgeon: Rexene PARAS. Rosalva, MD;  Location: [facility];  Service: Gynecology;  Laterality: N/A;   TUBAL LIGATION     Patient Active Problem List   Diagnosis Date Noted   Acute lateral meniscus tear of left knee 10/09/2023   Acute medial meniscus tear of left knee 10/09/2023   Loose body in knee, left knee 10/09/2023   Easy bruising 07/14/2019   Morbid obesity with BMI of 50.0-59.9, adult (HCC) 07/14/2019   Menorrhagia 09/22/2012    Dysmenorrhea 09/22/2012    PCP: Benjamine Aland MD   REFERRING PROVIDER: Addie Cordella Hamilton, MD  REFERRING DIAG: Diagnosis [phone] (ICD-10-CM) - S/P left knee arthroscopy  THERAPY DIAG:  Difficulty in walking, not elsewhere classified  Acute pain of left knee  Muscle weakness (generalized)  Localized edema  Stiffness of left knee, not elsewhere classified  Rationale for Evaluation and Treatment: Rehabilitation  ONSET DATE: L knee arthroscopy with meniscal debridement 09/30/23  SUBJECTIVE:   SUBJECTIVE STATEMENT: Madison Welch reports and demonstrates good HEP compliance and understanding.    EVAL: Have been feeling OK since surgery, doing some leg stretches and bends but it feels achey and stiff. Feeling shaky when I get in/out of the shower like the left is weak. Hurt the knee standing up quickly, jumped up and heard something pop. Its been swelling a little bit around the incisions, no drainage or redness that I've noticed from the incision. Knee doesn't feel sturdy yet but I'm trying to wean myself off of the walker.   PERTINENT HISTORY: See above  PAIN:  Are you having pain? Yes: NPRS scale: 2-6/10 Pain location: R knee  Pain description: stiffness    Aggravating factors: Sit to stand, feels like knee is buckling, stairs, morning stiffness Relieving factors: movement, heat, ice    PRECAUTIONS: updated 10/29/23- ok for WBAT exercise as tolerated per discussion with referring MD, also legally blind R eye per PMH in Epic  RED FLAGS: None   WEIGHT BEARING RESTRICTIONS: No MD told me to put as much weight as I can bear   FALLS:  Has patient fallen in last 6 months? No  LIVING ENVIRONMENT: Lives with: lives with their family Lives in: House/apartment Stairs: 1 STE, bedroom is upstairs but can stay on first level  Has following equipment at home: Environmental Consultant - 2 wheeled  OCCUPATION: works with hydrologist in corporate office- computer work, sedentary job   PLOF:  Independent, Independent with basic ADLs, Independent with gait, and Independent with transfers  PATIENT GOALS: get back to favorite hobby (dancing- line dancing, swing, step dancing, etc), walk without limits/pain, be able to dance in cowboy boots by my birthday    OBJECTIVE:  Note: Objective measures were completed at Evaluation unless otherwise noted.  DIAGNOSTIC FINDINGS: Summary:  RIGHT:  - No evidence of common femoral vein obstruction.   LEFT:  - There is no evidence of deep vein thrombosis in the lower extremity.    - No cystic structure found in the popliteal fossa.     PATIENT SURVEYS:  FOTO 50, predicted 19 in 13 visits; 11/17/23-  60.4%   COGNITION: Overall cognitive status: Within functional limits for tasks assessed     SENSATION: Not tested  EDEMA:  Appropriate for time since surgery (less than 2 weeks since time of eval)   LOWER EXTREMITY ROM:  Active ROM Right eval Left eval Left  10/18/23 Left 10/26/23 Left  10/29/23 Left 11/10/23 Left 11/17/23 Left 11/30/23  Knee flexion 120* 94* 110* 114 107* pain limited AROM  A:110 After heel slides: 113 AROM 108* supine heel slide  AROM supine  Knee extension 2* 5* 5*  4* supine heel prop, 15* seated LAQ AROM   A: 4 (hyperextension; LAQ) AROM 2* with LAQ     (Blank rows = not tested)  LOWER EXTREMITY MMT:  MMT Right eval Left eval Left 11/17/23  Hip flexion 5 3+ 4-  Hip abduction 4+ 3 3  Knee flexion 5 3- pain  4-  Knee extension 5 3 pain  4-  Ankle dorsiflexion 5 5    (Blank rows = not tested)    GAIT: Distance walked: in clinic distances  Assistive device utilized: Environmental Consultant - 2 wheeled Level of assistance: Modified independence Comments: antalgic, limited ROM L knee though gait cycle, trendelenburg                                                                                                                                 TREATMENT DATE:  12/02/2023 Recumbent bike Seat 4 for 5 minutes  Resistance 3-4 Seated straight leg raises 3 sets of 5 for 3 seconds Quad sets with 2 pillows under knees 10 x 5 seconds Knee Extension machine 90-45 degrees, up with both legs, down left only 10# 10 x slow eccentrics  Functional Activities: Double Leg Press 87# 10 x not quite full extension (  avoid hyper-extension) and slow eccentrics Single Leg Press 43# 10 x not quite full extension (avoid hyper-extension) and slow eccentrics Long talk about meniscus being a secondary shock absorber of the knee with the quadriceps being the primary, importance of avoiding knee hyper-extension and 2 activities (see therapeutic exercises above) to do at home for long-term quadriceps strength.   11/30/23: TherEx Scifit bike: level 4, seat at 11 x 8 minutes Leg press 87# bil 3 x 10; LLE only 43# 2 x 10 Knee extension bil LE lifting, left LE eccentric lowering 5# 2 x 10  Knee flexion 15# 2 x 10; LLE only Lt LAQ 4# 2x10 Calf stretch on incline board: x 2 holding 30 sec NMR:  Standing TKE green TB 2 x 10  Sit to stand: 2 x 10 from mat table in lowest position c UE support   11/17/23 FOTO, objective measures as above, education on progress with PT and POC moving forward   TherEx Nustep L6 x8  minutes all 4 extremities, w/u and tissue perfusion LAQs 4# 2x15 L LE  SLRs 0# x20 LLE SLRs 0# + ER x15    PATIENT EDUCATION:  Education details: exam findings, HEP, POC, don't force knee through painful activities, early to try to wean RW or attempt multiple steps just yet, time needing to allow healing after surgery along with appropriate PT exercises, appropriate rate of exercise/activity progression post-op, heat vs cold and when/where to use each  Person educated: Patient Education method: Explanation, Demonstration, and Handouts Education comprehension: verbalized understanding, returned demonstration, and needs further education  HOME EXERCISE PROGRAM: Access Code: [email or web address] URL:  [email or web address] Date: 12/02/2023 Prepared by: Lamar Ivory  Exercises - Supine Short Arc Quad  - 2 x daily - 7 x weekly - 1 sets - 10 reps - 2-3 seconds hold - Active Straight Leg Raise with Quad Set  - 2 x daily - 7 x weekly - 1 sets - 10 reps - Supine Heel Slide  - 2 x daily - 7 x weekly - 2 sets - 10 reps - 3 seconds  hold - Sidelying Hip Abduction  - 2 x daily - 7 x weekly - 1 sets - 10 reps - Seated Long Arc Quad  - 2 x daily - 7 x weekly - 1 sets - 10 reps - 2-3 seconds  hold - Seated Hamstring Stretch  - 2 x daily - 7 x weekly - 1 sets - 2 reps - 30 seconds  hold - Supine Quadriceps Sets  - 5 x daily - 7 x weekly - 2 sets - 10 reps - 5 second hold - Seated Straight Leg Raise  - 2 x daily - 7 x weekly - 3 sets - 5 reps - 3 seconds hold  ASSESSMENT:  CLINICAL IMPRESSION: Madison Welch notes good HEP compliance.  We spent quite a bit of time on education regarding long-term management and the importance of maintaining good quadriceps strength in combination with weight loss.  Madison Welch did a great job with new activities and she understands the importance of long-term quadriceps strengthening for long-term success.  Continue with heavy emphasis on quadriceps strengthening while avoiding overuse, particularly with WB.  EVAL: Patient is a 48 y.o. F who was seen today for physical therapy evaluation and treatment for Diagnosis Z98.890 (ICD-10-CM) - S/P left knee arthroscopy. Exam as expected and typical without significant complications noted. Should be able to progress well with skilled PT services.    OBJECTIVE IMPAIRMENTS: Abnormal gait, decreased activity tolerance,  decreased coordination, decreased knowledge of use of DME, decreased mobility, difficulty walking, decreased ROM, decreased strength, increased edema, increased fascial restrictions, increased muscle spasms, impaired flexibility, obesity, and pain.   ACTIVITY LIMITATIONS: standing, squatting, sleeping, stairs,  transfers, bed mobility, and locomotion level  PARTICIPATION LIMITATIONS: driving, shopping, community activity, occupation, and yard work  PERSONAL FACTORS: Age, Behavior pattern, Education, Fitness, Past/current experiences, and Time since onset of injury/illness/exacerbation are also affecting patient's functional outcome.   REHAB POTENTIAL: Good  CLINICAL DECISION MAKING: Stable/uncomplicated  EVALUATION COMPLEXITY: Low   GOALS: Goals reviewed with patient? No  SHORT TERM GOALS: Target date: 12/08/2023     Will be compliant with appropriate progressive HEP  Baseline: Goal status: MET 10/29/23  2.  Pain to be no more than 6/10 at worst  Baseline:  Goal status: ONGOING 12/02/23  more at end of day or walking at store  3.  Will be independent with edema management strategies such as ice/elevation Baseline:  Goal status: MET 11/17/23   4.  L knee AROM to be 0* extension, at least 115* flexion  Baseline:  Goal status: ONGOING 11/17/23 but progressing nicely (see tables above)   LONG TERM GOALS: Target date: 12/29/2023      MMT to have improved by at least one grade all tested groups in L LE  Baseline:  Goal status: ONGOING 11/17/23 improving nicely   2.  Pain to be no more than 3/10 at worst  Baseline:  Goal status: ONGOING 12/02/23  3.  Will be able to ambulate community distances no device no increase from resting pain levels with WNL gait mechanics  Baseline:  Goal status: ONGOING 12/02/23  4.  Will be able to navigate steps with reciprocal pattern, no increase in pain  Baseline:  Goal status: ONGOING 12/02/23  5.  Will have been able to return to dancing (low impact styles at first) with no increase in pain  Baseline:  Goal status: ONGOING 12/02/23  6.  FOTO score to be within 5 points of predicted value by time of DC  Baseline:  Goal status: ONGOING 11/17/23   PLAN:  PT FREQUENCY: 2x/week  PT DURATION: 6 weeks  PLANNED INTERVENTIONS: 97164- PT  Re-evaluation, 97110-Therapeutic exercises, 97530- Therapeutic activity, 97112- Neuromuscular re-education, 97535- Self Care, 02859- Manual therapy, [id number]- Gait training, [phone]- Aquatic Therapy, 97014- Electrical stimulation (unattended), 97016- Vasopneumatic device, [id number]- Ionotophoresis 4mg /ml Dexamethasone , Taping, Dry Needling, Cryotherapy, and Moist heat  PLAN FOR NEXT SESSION: Progress as appropriate/tolerated, needs more (partial to full WB) closed chain exercises with a quadriceps and slow eccentrics focus  NEXT MD VISIT: 12/20/23 with surgeon    Myer LELON Ivory PT, MPT 09/12/24 1:19 PM   09/12/24 1:19 PM

## 2023-12-07 ENCOUNTER — Encounter: Payer: BC Managed Care – PPO | Admitting: Rehabilitative and Restorative Service Providers"

## 2023-12-09 ENCOUNTER — Encounter: Payer: BC Managed Care – PPO | Admitting: Physical Therapy

## 2023-12-14 ENCOUNTER — Encounter: Payer: BC Managed Care – PPO | Admitting: Physical Therapy

## 2023-12-14 ENCOUNTER — Telehealth: Payer: Self-pay | Admitting: Physical Therapy

## 2023-12-14 NOTE — Telephone Encounter (Signed)
 I called pt to follow up about not showing up for her 11:00 PT appointment. Pt stating she thought her appointment time was at 11:45. I offered pt an open 10:15 appointment for tomorrow 12/15/23 with Moshe Cipro, PT, DTP. Pt accepted. Pt was also reminded of her already scheduled appointment on Thursday 12/16/23 with Cathren Harsh, PT, MPT.   Narda Amber, PT, MPT 12/14/23 11:22 AM

## 2023-12-15 ENCOUNTER — Encounter: Payer: BC Managed Care – PPO | Admitting: Physical Therapy

## 2023-12-16 ENCOUNTER — Encounter: Payer: BC Managed Care – PPO | Admitting: Rehabilitative and Restorative Service Providers"

## 2023-12-20 ENCOUNTER — Other Ambulatory Visit (INDEPENDENT_AMBULATORY_CARE_PROVIDER_SITE_OTHER): Payer: Self-pay

## 2023-12-20 ENCOUNTER — Other Ambulatory Visit: Payer: Self-pay

## 2023-12-20 ENCOUNTER — Ambulatory Visit (INDEPENDENT_AMBULATORY_CARE_PROVIDER_SITE_OTHER): Payer: BC Managed Care – PPO | Admitting: Surgical

## 2023-12-20 ENCOUNTER — Encounter: Payer: Self-pay | Admitting: Surgical

## 2023-12-20 DIAGNOSIS — M25552 Pain in left hip: Secondary | ICD-10-CM | POA: Diagnosis not present

## 2023-12-20 DIAGNOSIS — M545 Low back pain, unspecified: Secondary | ICD-10-CM

## 2023-12-20 MED ORDER — BUPIVACAINE HCL 0.25 % IJ SOLN
4.0000 mL | INTRAMUSCULAR | Status: AC | PRN
Start: 2023-12-20 — End: 2023-12-20
  Administered 2023-12-20: 4 mL via INTRA_ARTICULAR

## 2023-12-20 MED ORDER — METHYLPREDNISOLONE ACETATE 40 MG/ML IJ SUSP
40.0000 mg | INTRAMUSCULAR | Status: AC | PRN
  Administered 2023-12-20: 40 mg via INTRA_ARTICULAR

## 2023-12-20 MED ORDER — LIDOCAINE HCL 1 % IJ SOLN
5.0000 mL | INTRAMUSCULAR | Status: AC | PRN
Start: 2023-12-20 — End: 2023-12-20
  Administered 2023-12-20: 5 mL

## 2023-12-20 NOTE — Progress Notes (Signed)
 Post-Op Visit Note   Patient: Madison Welch           Date of Birth: [birth year]           MRN: [id number] Visit Date: 12/20/2023 PCP: Renaye Rakers, MD   Assessment & Plan:  Chief Complaint:  Chief Complaint  Patient presents with   Left Knee - Routine Post Op    09/30/23 LT KNEE SCOPE   Visit Diagnoses:  1. Pain in left hip   2. Acute left-sided low back pain without sciatica     Plan: Patient is a 48 year old female who presents s/p left knee arthroscopy with debridement of partial meniscal root tear on 09/30/2023.  She states that she really does not have any significant knee pain anymore and just notes knee stiffness and occasionally feeling like the knee wants to buckle on her.  The symptoms are slowly but steadily improving.  She actually complains primarily of left hip pain today with pain localizing to the left buttock region with some radiation down into the hamstring area.  She denies any numbness or tingling or burning sensation.  No groin pain.  She is ambulating with Trendelenburg gait.  On exam, patient has intact hip flexion, quadricep, hamstring, dorsiflexion, plantarflexion strength rated 5/5.  No knee effusion noted.  She has range of motion of the left knee from 0 degrees extension to 115 degrees of knee flexion.  No calf tenderness.  Negative Homans' sign.  No pain with hip range of motion with negative FADIR sign.  She has point tenderness in the buttock around the left SI joint with no such tenderness over the right SI joint.  No tenderness through the lumbar spine axially or any paraspinal musculature.  She does also have some mild to moderate tenderness over the greater trochanter on the left with no such tenderness on the right.  Left proximal hamstring tendon with equal bulk and strength compared with the right hamstring tendon while in prone position.  After discussion of options with Madison Welch, plan to try greater trochanter injection with ultrasound today.  Her  goal is to return to work on Thursday.  She will let us know if she has continued pain in the lateral hip/buttock region and if this injection does not help, next step would be MRI of the left hip for further evaluation of gluteal tendon pathology versus sacroiliac joint arthritis..  She really does not have any significant back pain and no significant radicular symptoms to make suspicious for referred pain from the lumbar spine.  Recommended that Madison Welch send me a message on MyChart in about a week to let me know how she is doing.  She did have some partial symptomatic relief after the injection with ambulation.    Procedure Note  Patient: Madison Welch             Date of Birth: 02-04-1976           MRN: [id number]             Visit Date: 12/20/2023  Procedures: Visit Diagnoses:  1. Greater trochanteric pain syndrome of left lower extremity   2. Pain in left hip   3. Acute left-sided low back pain without sciatica     Large Joint Inj: L greater trochanter on 12/20/2023 2:59 PM Indications: pain and diagnostic evaluation Details: 22 G 3.5 in needle, ultrasound-guided anterior approach Medications: 5 mL lidocaine 1 %; 4 mL bupivacaine 0.25 %; 40 mg methylPREDNISolone acetate 40 MG/ML Outcome: tolerated  well, no immediate complications Procedure, treatment alternatives, risks and benefits explained, specific risks discussed. Consent was given by the patient. Immediately prior to procedure a time out was called to verify the correct patient, procedure, equipment, support staff and site/side marked as required. Patient was prepped and draped in the usual sterile fashion.         Follow-Up Instructions: No follow-ups on file.   Orders:  Orders Placed This Encounter  Procedures   XR HIP UNILAT W OR W/O PELVIS 2-3 VIEWS LEFT   XR Lumbar Spine 2-3 Views   No orders of the defined types were placed in this encounter.   Imaging: No results found.  PMFS History: Patient Active  Problem List   Diagnosis Date Noted   Acute lateral meniscus tear of left knee 10/09/2023   Acute medial meniscus tear of left knee 10/09/2023   Loose body in knee, left knee 10/09/2023   Easy bruising 07/14/2019   Morbid obesity with BMI of 50.0-59.9, adult (HCC) 07/14/2019   Menorrhagia 09/22/2012   Dysmenorrhea 09/22/2012   Past Medical History:  Diagnosis Date   Asthma    Headache(784.0)    Legally blind    right eye   Obesity     Family History  Problem Relation Age of Onset   Lung cancer Father        metastatic to brain    Past Surgical History:  Procedure Laterality Date   ABDOMINAL HYSTERECTOMY     partial   CESAREAN SECTION     CYSTOSCOPY  09/21/2012   Procedure: CYSTOSCOPY;  Surgeon: Dorien Chihuahua. Richardson Dopp, MD;  Location: [facility];  Service: Gynecology;  Laterality: N/A;  with cystotomy repair   EYE SURGERY     KNEE ARTHROSCOPY WITH MEDIAL MENISECTOMY Left 09/30/2023   Procedure: LEFT KNEE ARTHROSCOPY WITH MENISCAL DEBRIDEMENT;  Surgeon: Cammy Copa, MD;  Location: [facility] OR;  Service: Orthopedics;  Laterality: Left;   LEG SURGERY     ROBOTIC ASSISTED LAPAROSCOPIC LYSIS OF ADHESION  09/21/2012   Procedure: ROBOTIC ASSISTED LAPAROSCOPIC LYSIS OF ADHESION;  Surgeon: Dorien Chihuahua. Richardson Dopp, MD;  Location: [facility];  Service: Gynecology;  Laterality: N/A;   ROBOTIC ASSISTED TOTAL HYSTERECTOMY  09/21/2012   Procedure: ROBOTIC ASSISTED TOTAL HYSTERECTOMY;  Surgeon: Dorien Chihuahua. Richardson Dopp, MD;  Location: [facility];  Service: Gynecology;  Laterality: N/A;   TUBAL LIGATION     Social History   Occupational History   Not on file  Tobacco Use   Smoking status: Every Day    Types: Cigarettes   Smokeless tobacco: Never  Vaping Use   Vaping status: Never Used  Substance and Sexual Activity   Alcohol use: Yes   Drug use: Yes    Frequency: 2.0 times per week    Types: Marijuana   Sexual activity: Not on file

## 2023-12-24 ENCOUNTER — Other Ambulatory Visit: Payer: Self-pay

## 2023-12-24 DIAGNOSIS — M25552 Pain in left hip: Secondary | ICD-10-CM

## 2023-12-24 NOTE — Telephone Encounter (Signed)
 Madison Welch, can we order MRI of the left hip for evaluation of gluteal tendinopathy for Geonna?

## 2023-12-29 NOTE — Telephone Encounter (Signed)
 Ok for this?

## 2023-12-29 NOTE — Telephone Encounter (Signed)
 Madison Welch, can we get a work note to take Madison Welch out of work until her MRI scan review due to severe hip pain?  Her MRI scan is on 3/26 so can we get her an appointment for several days or a week after that scan and a work note to be out until that date for the MRI review?  Thank you!

## 2024-01-12 ENCOUNTER — Ambulatory Visit
Admission: RE | Admit: 2024-01-12 | Discharge: 2024-01-12 | Disposition: A | Discharge status: Home / Self Care | Source: Ambulatory Visit | Attending: Surgical | Admitting: Surgical

## 2024-01-12 DIAGNOSIS — M25552 Pain in left hip: Secondary | ICD-10-CM

## 2024-01-20 ENCOUNTER — Encounter: Payer: Self-pay | Admitting: Orthopedic Surgery

## 2024-01-21 ENCOUNTER — Ambulatory Visit: Admitting: Orthopedic Surgery

## 2024-01-21 DIAGNOSIS — M5416 Radiculopathy, lumbar region: Secondary | ICD-10-CM | POA: Diagnosis not present

## 2024-01-21 MED ORDER — TRAMADOL HCL 50 MG PO TABS: 50.0000 mg | ORAL_TABLET | Freq: Three times a day (TID) | ORAL | 0 refills | Status: DC | PRN

## 2024-01-21 MED ORDER — METHOCARBAMOL 500 MG PO TABS: 500.0000 mg | ORAL_TABLET | Freq: Three times a day (TID) | ORAL | 0 refills | Status: DC | PRN

## 2024-01-21 MED ORDER — DICLOFENAC SODIUM 75 MG PO TBEC: DELAYED_RELEASE_TABLET | ORAL | 0 refills | Status: DC

## 2024-01-21 MED ORDER — GABAPENTIN 100 MG PO CAPS: 100.0000 mg | ORAL_CAPSULE | Freq: Three times a day (TID) | ORAL | 0 refills | Status: DC

## 2024-01-22 ENCOUNTER — Encounter: Payer: Self-pay | Admitting: Orthopedic Surgery

## 2024-01-22 NOTE — Progress Notes (Signed)
 Office Visit Note   Patient: Madison Welch           Date of Birth: November 13, 1975           MRN: [id number] Visit Date: 01/21/2024 Requested by: Renaye Rakers, MD [address] [place],  [place] [address] PCP: Renaye Rakers, MD  Subjective: Chief Complaint  Patient presents with   Left Hip - Follow-up   Left Knee - Follow-up     left knee arthroscopy with debridement of partial meniscal root tear on 09/30/2023    HPI: Kimberlye Dilger is a 48 y.o. female who presents to the office reporting continuing left hip and leg pain.  Since she was last seen she has had an MRI scan of the hip.  On my review there is no effusion arthritis fracture or tendon tears around the left hip.  She did have a trochanteric injection which gave her about 1 day of mild relief only.  She has been using horse liniment and heat.  Pain is incapacitating.  Radiates from her buttock down the back of her leg down below the knee.  She did have a fall on her hip after surgery.  It is a sharp radiating pain.  She is limping.  Stairs are difficult.  She is working from home.  Hard to get in and out of a car.  Denies any fevers and chills..                ROS: All systems reviewed are negative as they relate to the chief complaint within the history of present illness.  Patient denies fevers or chills.  Assessment & Plan: Visit Diagnoses:  1. Radiculopathy, lumbar region     Plan: Impression is left-sided radiculopathy.  Hip MRI scan underwhelming.  Plan gabapentin, tramadol, Voltaren, Robaxin, also needs stat MRI lumbar spine to evaluate left-sided radiculopathy with intractable pain.  Continue working from home for 4 more weeks.  This has been a serious adverse event in her life.  She is now about 4 months out to 5 months out from left knee arthroscopy and debridement and she did well with that.  Follow-Up Instructions: No follow-ups on file.   Orders:  Orders Placed This Encounter  Procedures   MR Lumbar Spine w/o  contrast   Meds ordered this encounter  Medications   gabapentin (NEURONTIN) 100 MG capsule    Sig: Take 1 capsule (100 mg total) by mouth 3 (three) times daily.    Dispense:  60 capsule    Refill:  0   diclofenac (VOLTAREN) 75 MG EC tablet    Sig: 1 po BID x 2 weeks    Dispense:  28 tablet    Refill:  0   methocarbamol (ROBAXIN) 500 MG tablet    Sig: Take 1 tablet (500 mg total) by mouth every 8 (eight) hours as needed for muscle spasms.    Dispense:  30 tablet    Refill:  0   traMADol (ULTRAM) 50 MG tablet    Sig: Take 1 tablet (50 mg total) by mouth every 8 (eight) hours as needed.    Dispense:  30 tablet    Refill:  0      Procedures: No procedures performed   Clinical Data: No additional findings.  Objective: Vital Signs: LMP 09/16/2012   Physical Exam:  Constitutional: Patient appears well-developed HEENT:  Head: Normocephalic Eyes:EOM are normal Neck: Normal range of motion Cardiovascular: Normal rate Pulmonary/chest: Effort normal Neurologic: Patient is alert  Skin: Skin is warm Psychiatric: Patient has normal mood and affect  Ortho Exam: Ortho exam demonstrates no real groin pain with internal/external Tatian of either leg.  Does have sciatic notch tenderness on the left with no trochanteric tenderness.  No definite paresthesias L1-S1 bilaterally.  Pedal pulses palpable.  No masses lymphadenopathy or skin changes noted in that back or hip region.  Muscle strength symmetric bilaterally.  Slightly antalgic gait to the left.  Specialty Comments:  No specialty comments available.  Imaging: No results found.   PMFS History: Patient Active Problem List   Diagnosis Date Noted   Acute lateral meniscus tear of left knee 10/09/2023   Acute medial meniscus tear of left knee 10/09/2023   Loose body in knee, left knee 10/09/2023   Easy bruising 07/14/2019   Morbid obesity with BMI of 50.0-59.9, adult (HCC) 07/14/2019   Menorrhagia 09/22/2012   Dysmenorrhea  09/22/2012   Past Medical History:  Diagnosis Date   Asthma    Headache(784.0)    Legally blind    right eye   Obesity     Family History  Problem Relation Age of Onset   Lung cancer Father        metastatic to brain    Past Surgical History:  Procedure Laterality Date   ABDOMINAL HYSTERECTOMY     partial   CESAREAN SECTION     CYSTOSCOPY  09/21/2012   Procedure: CYSTOSCOPY;  Surgeon: Dorien Chihuahua. Richardson Dopp, MD;  Location: [facility];  Service: Gynecology;  Laterality: N/A;  with cystotomy repair   EYE SURGERY     KNEE ARTHROSCOPY WITH MEDIAL MENISECTOMY Left 09/30/2023   Procedure: LEFT KNEE ARTHROSCOPY WITH MENISCAL DEBRIDEMENT;  Surgeon: Cammy Copa, MD;  Location: [facility] OR;  Service: Orthopedics;  Laterality: Left;   LEG SURGERY     ROBOTIC ASSISTED LAPAROSCOPIC LYSIS OF ADHESION  09/21/2012   Procedure: ROBOTIC ASSISTED LAPAROSCOPIC LYSIS OF ADHESION;  Surgeon: Dorien Chihuahua. Richardson Dopp, MD;  Location: [facility];  Service: Gynecology;  Laterality: N/A;   ROBOTIC ASSISTED TOTAL HYSTERECTOMY  09/21/2012   Procedure: ROBOTIC ASSISTED TOTAL HYSTERECTOMY;  Surgeon: Dorien Chihuahua. Richardson Dopp, MD;  Location: [facility];  Service: Gynecology;  Laterality: N/A;   TUBAL LIGATION     Social History   Occupational History   Not on file  Tobacco Use   Smoking status: Every Day    Types: Cigarettes   Smokeless tobacco: Never  Vaping Use   Vaping status: Never Used  Substance and Sexual Activity   Alcohol use: Yes   Drug use: Yes    Frequency: 2.0 times per week    Types: Marijuana   Sexual activity: Not on file

## 2024-01-23 ENCOUNTER — Ambulatory Visit
Admission: RE | Admit: 2024-01-23 | Discharge: 2024-01-23 | Disposition: A | Discharge status: Home / Self Care | Source: Ambulatory Visit | Attending: Orthopedic Surgery | Admitting: Orthopedic Surgery

## 2024-01-23 DIAGNOSIS — M5416 Radiculopathy, lumbar region: Secondary | ICD-10-CM

## 2024-01-24 ENCOUNTER — Other Ambulatory Visit: Payer: Self-pay | Admitting: Orthopedic Surgery

## 2024-01-24 NOTE — Telephone Encounter (Signed)
 I called her.  Hip MRI is normal.  Back could have some L4 radiculitis.  Can you please set her up to see Dr. Alvester Morin for lumbar ESI and then if she is no better we may have to refer her to somewhere else just because I do not think it is orthopedic at that point.  Thanks

## 2024-01-25 ENCOUNTER — Other Ambulatory Visit: Payer: Self-pay

## 2024-01-25 DIAGNOSIS — M5416 Radiculopathy, lumbar region: Secondary | ICD-10-CM

## 2024-02-01 ENCOUNTER — Telehealth: Payer: Self-pay | Admitting: Physical Medicine and Rehabilitation

## 2024-02-01 ENCOUNTER — Other Ambulatory Visit: Payer: Self-pay | Admitting: Orthopedic Surgery

## 2024-02-01 MED ORDER — TRAMADOL HCL 50 MG PO TABS: 50.0000 mg | ORAL_TABLET | Freq: Three times a day (TID) | ORAL | 0 refills | Status: DC | PRN

## 2024-02-01 MED ORDER — METHOCARBAMOL 500 MG PO TABS
500.0000 mg | ORAL_TABLET | Freq: Three times a day (TID) | ORAL | 0 refills | Status: DC | PRN
Start: 2024-02-01 — End: 2024-02-25

## 2024-02-01 NOTE — Telephone Encounter (Signed)
 Patient called and she was returning your call. She said that she can do any day just call and let her [phone]

## 2024-02-10 ENCOUNTER — Other Ambulatory Visit: Payer: Self-pay

## 2024-02-10 ENCOUNTER — Ambulatory Visit: Admitting: Physical Medicine and Rehabilitation

## 2024-02-10 VITALS — BP 138/88 | HR 76

## 2024-02-10 DIAGNOSIS — M5416 Radiculopathy, lumbar region: Secondary | ICD-10-CM

## 2024-02-10 MED ORDER — METHYLPREDNISOLONE ACETATE 40 MG/ML IJ SUSP
40.0000 mg | Freq: Once | INTRAMUSCULAR | Status: AC
  Administered 2024-02-10: 40 mg

## 2024-02-10 NOTE — Procedures (Signed)
 Lumbar Epidural Steroid Injection - Interlaminar Approach with Fluoroscopic Guidance  Patient: Madison Welch      Date of Birth: [birth year] MRN: [id number] PCP: Jonathon Neighbors, MD      Visit Date: 02/10/2024   Universal Protocol:     Consent Given By: the patient  Position: PRONE  Additional Comments: Vital signs were monitored before and after the procedure. Patient was prepped and draped in the usual sterile fashion. The correct patient, procedure, and site was verified.   Injection Procedure Details:   Procedure diagnoses: Lumbar radiculopathy [M54.16]   Meds Administered:  Meds ordered this encounter  Medications   methylPREDNISolone  acetate (DEPO-MEDROL ) injection 40 mg     Laterality: Left  Location/Site:  L4-5  Needle: 4.5 in., 20 ga. Tuohy  Needle Placement: Paramedian epidural  Findings:   -Comments: Excellent flow of contrast into the epidural space.  Procedure Details: Using a paramedian approach from the side mentioned above, the region overlying the inferior lamina was localized under fluoroscopic visualization and the soft tissues overlying this structure were infiltrated with 4 ml. of 1% Lidocaine  without Epinephrine . The Tuohy needle was inserted into the epidural space using a paramedian approach.   The epidural space was localized using loss of resistance along with counter oblique bi-planar fluoroscopic views.  After negative aspirate for air, blood, and CSF, a 2 ml. volume of Isovue-250 was injected into the epidural space and the flow of contrast was observed. Radiographs were obtained for documentation purposes.    The injectate was administered into the level noted above.   Additional Comments:  The patient tolerated the procedure well Dressing: 2 x 2 sterile gauze and Band-Aid    Post-procedure details: Patient was observed during the procedure. Post-procedure instructions were reviewed.  Patient left the clinic in stable condition.

## 2024-02-10 NOTE — Progress Notes (Signed)
 Pain Scale   Average Pain 6 Patient advising that her pain is constant in her lower back , she advises she uses heat at night to get some relief, but it is minimal relief        +Driver, -BT, -Dye Allergies.

## 2024-02-10 NOTE — Patient Instructions (Signed)

## 2024-02-10 NOTE — Progress Notes (Signed)
 Madison Welch - 48 y.o. female MRN [id number]  Date of birth: Feb 20, 1976  Office Visit Note: Visit Date: 02/10/2024 PCP: Jonathon Neighbors, MD Referred by: Jonathon Neighbors, MD  Subjective: Chief Complaint  Patient presents with   Lower Back - Pain   HPI:  Madison Welch is a 48 y.o. female who comes in today at the request of Dr. Marykay Snipes for planned Left L4-5 Lumbar Interlaminar epidural steroid injection with fluoroscopic guidance.  The patient has failed conservative care including home exercise, medications, time and activity modification.  This injection will be diagnostic and hopefully therapeutic.  Please see requesting physician notes for further details and justification.Transitional S1 lumbarized segment.   ROS Otherwise per HPI.  Assessment & Plan: Visit Diagnoses:    ICD-10-CM   1. Lumbar radiculopathy  M54.16 XR C-ARM NO REPORT    Epidural Steroid injection    methylPREDNISolone  acetate (DEPO-MEDROL ) injection 40 mg      Plan: No additional findings.   Meds & Orders:  Meds ordered this encounter  Medications   methylPREDNISolone  acetate (DEPO-MEDROL ) injection 40 mg    Orders Placed This Encounter  Procedures   XR C-ARM NO REPORT   Epidural Steroid injection    Follow-up: Return for visit to requesting provider as needed.   Procedures: No procedures performed  Lumbar Epidural Steroid Injection - Interlaminar Approach with Fluoroscopic Guidance  Patient: Madison Welch      Date of Birth: 11/18/1975 MRN: [id number] PCP: Jonathon Neighbors, MD      Visit Date: 02/10/2024   Universal Protocol:     Consent Given By: the patient  Position: PRONE  Additional Comments: Vital signs were monitored before and after the procedure. Patient was prepped and draped in the usual sterile fashion. The correct patient, procedure, and site was verified.   Injection Procedure Details:   Procedure diagnoses: Lumbar radiculopathy [M54.16]   Meds Administered:  Meds  ordered this encounter  Medications   methylPREDNISolone  acetate (DEPO-MEDROL ) injection 40 mg     Laterality: Left  Location/Site:  L4-5  Needle: 4.5 in., 20 ga. Tuohy  Needle Placement: Paramedian epidural  Findings:   -Comments: Excellent flow of contrast into the epidural space.  Procedure Details: Using a paramedian approach from the side mentioned above, the region overlying the inferior lamina was localized under fluoroscopic visualization and the soft tissues overlying this structure were infiltrated with 4 ml. of 1% Lidocaine  without Epinephrine . The Tuohy needle was inserted into the epidural space using a paramedian approach.   The epidural space was localized using loss of resistance along with counter oblique bi-planar fluoroscopic views.  After negative aspirate for air, blood, and CSF, a 2 ml. volume of Isovue-250 was injected into the epidural space and the flow of contrast was observed. Radiographs were obtained for documentation purposes.    The injectate was administered into the level noted above.   Additional Comments:  The patient tolerated the procedure well Dressing: 2 x 2 sterile gauze and Band-Aid    Post-procedure details: Patient was observed during the procedure. Post-procedure instructions were reviewed.  Patient left the clinic in stable condition.   Clinical History: MRI LUMBAR SPINE WITHOUT CONTRAST   TECHNIQUE: Multiplanar, multisequence MR imaging of the lumbar spine was performed. No intravenous contrast was administered.   COMPARISON:  Lumbar radiographs 12/20/2023.   FINDINGS: Segmentation: Probable transitional lumbosacral anatomy with a full size S1-S2 disc space (some anatomic detail on prior radiographs limited due to large body habitus. But this appears  to result in full size ribs at T12 on both exams. Correlation with radiographs is recommended prior to any operative intervention.   Alignment: Normal lumbar lordosis. No  significant scoliosis or spondylolisthesis.   Vertebrae: Maintained vertebral body height. No marrow edema or evidence of acute osseous abnormality. Visualized bone marrow signal is within normal limits. Intact visible sacrum and SI joints.   Conus medullaris and cauda equina: Conus extends to the L1-L2 level. No lower spinal cord or conus signal abnormality. Generally normal cauda equina nerve roots.   Paraspinal and other soft tissues: Large body habitus, otherwise negative.   Disc levels:   T12-L1:  Negative.   L1-L2:  Mild facet hypertrophy.  Otherwise negative.   L2-L3: Mild facet and ligament flavum hypertrophy, otherwise negative.   L3-L4: Borderline to mild facet hypertrophy, mostly on the left. Otherwise negative.   L4-L5: Mild circumferential disc bulge. Mild ligament flavum, mild to moderate facet hypertrophy. Mild epidural lipomatosis. Borderline spinal stenosis (series 105, image 23 and series 102, image 11). Mild to moderate L4 neural foraminal stenosis, perhaps greater on the right.   L5-S1: Epidural lipomatosis primarily effaces CSF from the thecal sac at this level. But there is up to moderate bilateral facet hypertrophy with degenerative facet joint fluid on the right. No significant disc bulging.   S1-S2: Mostly lumbarized S1 level and full size disc. Epidural lipomatosis and otherwise negative.   IMPRESSION: 1. 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 Correlation with radiographs is recommended prior to any operative intervention.   2. Dominant lumbar degenerative finding is facet arthropathy, maximal at L4-L5 and L5-S1. However, superimposed disc bulging at the former with borderline spinal stenosis there and up to moderate bilateral neural foraminal stenosis. Query L4 radiculitis.     Electronically Signed   By: Marlise Simpers M.D.   On: 01/23/2024 08:39     Objective:  VS:  HT:    WT:    BMI:     BP:138/88  HR:76bpm  TEMP: ( )  RESP:  Physical Exam Vitals and nursing note reviewed.  Constitutional:      General: She is not in acute distress.    Appearance: Normal appearance. She is obese. She is not ill-appearing.  HENT:     Head: Normocephalic and atraumatic.     Right Ear: External ear normal.     Left Ear: External ear normal.  Eyes:     Extraocular Movements: Extraocular movements intact.  Cardiovascular:     Rate and Rhythm: Normal rate.     Pulses: Normal pulses.  Pulmonary:     Effort: Pulmonary effort is normal. No respiratory distress.  Abdominal:     General: There is no distension.     Palpations: Abdomen is soft.  Musculoskeletal:        General: Tenderness present.     Cervical back: Neck supple.     Right lower leg: No edema.     Left lower leg: No edema.     Comments: Patient has good distal strength with no pain over the greater trochanters.  No clonus or focal weakness.  Skin:    Findings: No erythema, lesion or rash.  Neurological:     General: No focal deficit present.     Mental Status: She is alert and oriented to person, place, and time.     Sensory: No sensory deficit.     Motor: No weakness or abnormal muscle tone.  Coordination: Coordination normal.  Psychiatric:        Mood and Affect: Mood normal.        Behavior: Behavior normal.      Imaging: No results found.

## 2024-02-11 NOTE — Telephone Encounter (Signed)
 Ok to extend for 6 weeks thx

## 2024-02-24 ENCOUNTER — Telehealth: Payer: Self-pay

## 2024-02-24 NOTE — Telephone Encounter (Signed)
 Robert with [employer] called in reference to last work note given to patient. He has questions... 1- What keeps patient from being able to work in the office? Not asking for a diagnosis. 2-How many days is patient able to work from home, 5?  Direct line for [employer] [phone]

## 2024-02-25 ENCOUNTER — Encounter (HOSPITAL_BASED_OUTPATIENT_CLINIC_OR_DEPARTMENT_OTHER): Payer: Self-pay

## 2024-02-25 ENCOUNTER — Other Ambulatory Visit: Payer: Self-pay | Admitting: Orthopedic Surgery

## 2024-02-25 MED ORDER — TRAMADOL HCL 50 MG PO TABS: 50.0000 mg | ORAL_TABLET | Freq: Three times a day (TID) | ORAL | 0 refills | Status: DC | PRN

## 2024-02-25 MED ORDER — METHOCARBAMOL 500 MG PO TABS: 500.0000 mg | ORAL_TABLET | Freq: Three times a day (TID) | ORAL | 0 refills | Status: DC | PRN

## 2024-02-25 MED ORDER — DICLOFENAC SODIUM 75 MG PO TBEC: DELAYED_RELEASE_TABLET | ORAL | 0 refills | Status: DC

## 2024-02-25 NOTE — Telephone Encounter (Signed)
 Decreased standing and walking endurance as well as decreased sitting endurance really due to a back problem which we are working up currently  I think she could work 5 days from home per week.

## 2024-02-28 NOTE — Telephone Encounter (Signed)
 Madison Welch aware of [employer]

## 2024-03-01 ENCOUNTER — Other Ambulatory Visit: Payer: Self-pay | Admitting: Orthopedic Surgery

## 2024-03-02 MED ORDER — GABAPENTIN 100 MG PO CAPS: 100.0000 mg | ORAL_CAPSULE | Freq: Three times a day (TID) | ORAL | 0 refills | Status: DC

## 2024-03-24 ENCOUNTER — Other Ambulatory Visit: Payer: Self-pay | Admitting: Radiology

## 2024-03-24 ENCOUNTER — Encounter: Payer: Self-pay | Admitting: Orthopedic Surgery

## 2024-03-24 ENCOUNTER — Telehealth: Payer: Self-pay | Admitting: Orthopedic Surgery

## 2024-03-24 ENCOUNTER — Ambulatory Visit: Admitting: Orthopedic Surgery

## 2024-03-24 DIAGNOSIS — M5416 Radiculopathy, lumbar region: Secondary | ICD-10-CM

## 2024-03-24 DIAGNOSIS — M1712 Unilateral primary osteoarthritis, left knee: Secondary | ICD-10-CM

## 2024-03-24 DIAGNOSIS — Z9889 Other specified postprocedural states: Secondary | ICD-10-CM

## 2024-03-24 MED ORDER — LIDOCAINE HCL 1 % IJ SOLN
5.0000 mL | INTRAMUSCULAR | Status: AC | PRN
  Administered 2024-03-24: 5 mL

## 2024-03-24 MED ORDER — OXYCODONE HCL 5 MG PO CAPS: 5.0000 mg | ORAL_CAPSULE | Freq: Two times a day (BID) | ORAL | 0 refills | Status: DC | PRN

## 2024-03-24 MED ORDER — BUPIVACAINE HCL 0.25 % IJ SOLN
4.0000 mL | INTRAMUSCULAR | Status: AC | PRN
Start: 2024-03-24 — End: 2024-03-24
  Administered 2024-03-24: 4 mL via INTRA_ARTICULAR

## 2024-03-24 MED ORDER — TRIAMCINOLONE ACETONIDE 40 MG/ML IJ SUSP
40.0000 mg | INTRAMUSCULAR | Status: AC | PRN
  Administered 2024-03-24: 40 mg via INTRA_ARTICULAR

## 2024-03-24 NOTE — Telephone Encounter (Signed)
 Patient called and said she needed something for pain. [phone]

## 2024-03-24 NOTE — Progress Notes (Signed)
 Office Visit Note   Patient: Madison Welch           Date of Birth: 10/25/1975           MRN: [id number] Visit Date: 03/24/2024 Requested by: Jonathon Neighbors, MD [address] [place],  [place] [address] PCP: Jonathon Neighbors, MD  Subjective: Chief Complaint  Patient presents with   Left Hip - Follow-up   Left Knee - Follow-up     left knee arthroscopy with debridement of partial meniscal root tear on 09/30/2023    HPI: Madison Welch is a 48 y.o. female who presents to the office reporting left hip pain and low back pain as well as continued left knee pain.  Patient had greater trochanteric injection on 12/20/2023.  That took the edge off of her symptoms.  She reports pain in the posterior hip region and buttock region down to the left knee.  Difficult for her to do daily activities.  Any standing or walking increases the pain.  She is taking care of her sister who has lung cancer and is doing chemotherapy.  Uses Goody powder and ibuprofen .  Also uses occasional lidocaine .  Tramadol  is not helpful.  Also reports left knee buckling and lateral and anterior pain.  Difficulty getting up and getting going.  Recently had a back injection with Dr. Daisey Dryer which was helpful for weeks.  This happened 1 month ago on 02/10/2024.  Gabapentin  makes her sleepy.  She is out of tramadol .  Has a history of left knee arthroscopy knee as well as.Left hip MRI which showed no hip fracture or AVN with early degenerative changes in the hip intact pelvic musculature and no bursitis or tendinosis.  MRI of the lumbar spine shows facet arthropathy at L4-5 and L5-S1.  Possible L4 radiculitis.              ROS: All systems reviewed are negative as they relate to the chief complaint within the history of present illness.  Patient denies fevers or chills.  Assessment & Plan: Visit Diagnoses:  1. Lumbar radiculopathy   2. S/P left knee arthroscopy     Plan: Impression is left knee pain with no effusion which could be  postarthroscopy related.  Patient does have increased body mass index.  Injection performed in the knee today.  Refer to Dr. Daisey Dryer for [facility] lumbar spine.  One-time prescription for oxycodone  written.  I do not really think she is in a great place for any further surgery.  Surgery is not possible on the knee because of her BMI and I think that in her back she does not really necessarily have definitively operative problems but she does have a lot of pain.  Will see her back as needed.  I think she is going to need to continue to work from home until 06/24/2024 because of knee mobility issues.  Follow-Up Instructions: No follow-ups on file.   Orders:  No orders of the defined types were placed in this encounter.  No orders of the defined types were placed in this encounter.     Procedures: Large Joint Inj: L knee on 03/24/2024 9:07 PM Indications: diagnostic evaluation, joint swelling and pain Details: 18 G 1.5 in needle, superolateral approach  Arthrogram: No  Medications: 5 mL lidocaine  1 %; 4 mL bupivacaine  0.25 %; 40 mg triamcinolone acetonide 40 MG/ML Outcome: tolerated well, no immediate complications Procedure, treatment alternatives, risks and benefits explained, specific risks discussed. Consent was given by the patient. Immediately  prior to procedure a time out was called to verify the correct patient, procedure, equipment, support staff and site/side marked as required. Patient was prepped and draped in the usual sterile fashion.       Clinical Data: No additional findings.  Objective: Vital Signs: LMP 09/16/2012   Physical Exam:  Constitutional: Patient appears well-developed HEENT:  Head: Normocephalic Eyes:EOM are normal Neck: Normal range of motion Cardiovascular: Normal rate Pulmonary/chest: Effort normal Neurologic: Patient is alert Skin: Skin is warm Psychiatric: Patient has normal mood and affect  Ortho Exam: Ortho exam demonstrates full range of motion of  that left knee with no effusion.  Collateral crucial ligaments are stable.  No calf tenderness negative Homans on the left.  Femoral groin pain with internal/external rotation of that left leg.  Does have no paresthesias L1 S1 bilaterally.  Does have pain with FADIR lateral bending but no discrete trochanteric tenderness on the left.  Specialty Comments:  MRI LUMBAR SPINE WITHOUT CONTRAST   TECHNIQUE: Multiplanar, multisequence MR imaging of the lumbar spine was performed. No intravenous contrast was administered.   COMPARISON:  Lumbar radiographs 12/20/2023.   FINDINGS: Segmentation: Probable transitional lumbosacral anatomy with a full size S1-S2 disc space (some anatomic detail on prior radiographs limited due to large body habitus. But this appears to result in full size ribs at T12 on both exams. Correlation with radiographs is recommended prior to any operative intervention.   Alignment: Normal lumbar lordosis. No significant scoliosis or spondylolisthesis.   Vertebrae: Maintained vertebral body height. No marrow edema or evidence of acute osseous abnormality. Visualized bone marrow signal is within normal limits. Intact visible sacrum and SI joints.   Conus medullaris and cauda equina: Conus extends to the L1-L2 level. No lower spinal cord or conus signal abnormality. Generally normal cauda equina nerve roots.   Paraspinal and other soft tissues: Large body habitus, otherwise negative.   Disc levels:   T12-L1:  Negative.   L1-L2:  Mild facet hypertrophy.  Otherwise negative.   L2-L3: Mild facet and ligament flavum hypertrophy, otherwise negative.   L3-L4: Borderline to mild facet hypertrophy, mostly on the left. Otherwise negative.   L4-L5: Mild circumferential disc bulge. Mild ligament flavum, mild to moderate facet hypertrophy. Mild epidural lipomatosis. Borderline spinal stenosis (series 105, image 23 and series 102, image 11). Mild to moderate L4 neural  foraminal stenosis, perhaps greater on the right.   L5-S1: Epidural lipomatosis primarily effaces CSF from the thecal sac at this level. But there is up to moderate bilateral facet hypertrophy with degenerative facet joint fluid on the right. No significant disc bulging.   S1-S2: Mostly lumbarized S1 level and full size disc. Epidural lipomatosis and otherwise negative.   IMPRESSION: 1. 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 Correlation with radiographs is recommended prior to any operative intervention.   2. Dominant lumbar degenerative finding is facet arthropathy, maximal at L4-L5 and L5-S1. However, superimposed disc bulging at the former with borderline spinal stenosis there and up to moderate bilateral neural foraminal stenosis. Query L4 radiculitis.     Electronically Signed   By: Marlise Simpers M.D.   On: 01/23/2024 08:39  Imaging: No results found.   PMFS History: Patient Active Problem List   Diagnosis Date Noted   Acute lateral meniscus tear of left knee 10/09/2023   Acute medial meniscus tear of left knee 10/09/2023   Loose body in knee, left knee 10/09/2023   Easy bruising 07/14/2019  Morbid obesity with BMI of 50.0-59.9, adult (HCC) 07/14/2019   Menorrhagia 09/22/2012   Dysmenorrhea 09/22/2012   Past Medical History:  Diagnosis Date   Asthma    Headache(784.0)    Legally blind    right eye   Obesity     Family History  Problem Relation Age of Onset   Lung cancer Father        metastatic to brain    Past Surgical History:  Procedure Laterality Date   ABDOMINAL HYSTERECTOMY     partial   CESAREAN SECTION     CYSTOSCOPY  09/21/2012   Procedure: CYSTOSCOPY;  Surgeon: Lizette Righter. Wynona Hedger, MD;  Location: [facility];  Service: Gynecology;  Laterality: N/A;  with cystotomy repair   EYE SURGERY     KNEE ARTHROSCOPY WITH MEDIAL MENISECTOMY Left 09/30/2023   Procedure: LEFT KNEE ARTHROSCOPY WITH MENISCAL  DEBRIDEMENT;  Surgeon: Jasmine Mesi, MD;  [facility];  Service: Orthopedics;  Laterality: Left;   LEG SURGERY     ROBOTIC ASSISTED LAPAROSCOPIC LYSIS OF ADHESION  09/21/2012   Procedure: ROBOTIC ASSISTED LAPAROSCOPIC LYSIS OF ADHESION;  Surgeon: Lizette Righter. Wynona Hedger, MD;  Location: [facility];  Service: Gynecology;  Laterality: N/A;   ROBOTIC ASSISTED TOTAL HYSTERECTOMY  09/21/2012   Procedure: ROBOTIC ASSISTED TOTAL HYSTERECTOMY;  Surgeon: Lizette Righter. Wynona Hedger, MD;  Location: [facility];  Service: Gynecology;  Laterality: N/A;   TUBAL LIGATION     Social History   Occupational History   Not on file  Tobacco Use   Smoking status: Every Day    Types: Cigarettes   Smokeless tobacco: Never  Vaping Use   Vaping status: Never Used  Substance and Sexual Activity   Alcohol use: Yes   Drug use: Yes    Frequency: 2.0 times per week    Types: Marijuana   Sexual activity: Not on file

## 2024-04-03 ENCOUNTER — Other Ambulatory Visit: Payer: Self-pay | Admitting: Surgical

## 2024-04-03 MED ORDER — METHOCARBAMOL 500 MG PO TABS: 500.0000 mg | ORAL_TABLET | Freq: Three times a day (TID) | ORAL | 0 refills | Status: AC | PRN

## 2024-04-03 MED ORDER — DICLOFENAC SODIUM 75 MG PO TBEC: DELAYED_RELEASE_TABLET | ORAL | 0 refills | Status: AC

## 2024-04-03 MED ORDER — TRAMADOL HCL 50 MG PO TABS: 50.0000 mg | ORAL_TABLET | Freq: Three times a day (TID) | ORAL | 0 refills | Status: DC | PRN

## 2024-05-04 ENCOUNTER — Encounter: Payer: Self-pay | Admitting: Orthopedic Surgery

## 2024-05-08 ENCOUNTER — Telehealth: Payer: Self-pay | Admitting: Orthopedic Surgery

## 2024-05-08 NOTE — Telephone Encounter (Signed)
 Patient called. Would like hydrocodone  called in for her.

## 2024-05-09 ENCOUNTER — Other Ambulatory Visit: Payer: Self-pay | Admitting: Orthopedic Surgery

## 2024-05-09 MED ORDER — TRAMADOL HCL 50 MG PO TABS: 50.0000 mg | ORAL_TABLET | Freq: Two times a day (BID) | ORAL | 0 refills | Status: DC | PRN

## 2024-05-09 NOTE — Telephone Encounter (Signed)
 Tramadol  refilled thanks

## 2024-05-11 ENCOUNTER — Other Ambulatory Visit: Payer: Self-pay | Admitting: Surgical

## 2024-05-11 MED ORDER — TRAMADOL HCL 50 MG PO TABS: 50.0000 mg | ORAL_TABLET | Freq: Two times a day (BID) | ORAL | 0 refills | Status: DC | PRN

## 2024-05-11 NOTE — Telephone Encounter (Signed)
 thx

## 2024-05-11 NOTE — Telephone Encounter (Signed)
 Sent this in and sent her a message

## 2024-05-16 MED ORDER — HYDROCODONE-ACETAMINOPHEN 5-325 MG PO TABS: 1.0000 | ORAL_TABLET | Freq: Two times a day (BID) | ORAL | 0 refills | Status: DC | PRN

## 2024-05-16 NOTE — Addendum Note (Signed)
 Addended by: SHIRLY CARLIN CROME on: 05/16/2024 04:58 PM   Modules accepted: Orders

## 2024-05-26 ENCOUNTER — Encounter: Payer: Self-pay | Admitting: Orthopedic Surgery

## 2024-06-01 ENCOUNTER — Telehealth: Payer: Self-pay | Admitting: Orthopedic Surgery

## 2024-06-01 NOTE — Telephone Encounter (Signed)
 Pt called stating Addie stated she can have update work from home accomodation letter for 3 months start date 05/24/24. Pt is asking for this to be sent to herr mychart. Pt phone number is [phone].

## 2024-06-02 NOTE — Telephone Encounter (Signed)
 Double message, this was taken of yesterday

## 2024-06-21 ENCOUNTER — Encounter: Admitting: Physical Medicine and Rehabilitation

## 2024-06-26 ENCOUNTER — Telehealth: Payer: Self-pay
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
## 2024-06-26 NOTE — Telephone Encounter (Signed)
 I will need to wait to submit a new request until 9/10, a day after her current prior auth expires. Holding this note as reminder.

## 2024-07-06 ENCOUNTER — Ambulatory Visit (INDEPENDENT_AMBULATORY_CARE_PROVIDER_SITE_OTHER): Admitting: Physical Medicine and Rehabilitation

## 2024-07-06 ENCOUNTER — Other Ambulatory Visit: Payer: Self-pay

## 2024-07-06 VITALS — BP 154/92 | HR 68

## 2024-07-06 DIAGNOSIS — M5416 Radiculopathy, lumbar region: Secondary | ICD-10-CM

## 2024-07-06 MED ORDER — METHYLPREDNISOLONE ACETATE 80 MG/ML IJ SUSP
40.0000 mg | Freq: Once | INTRAMUSCULAR | Status: AC
  Administered 2024-07-06: 40 mg

## 2024-07-06 NOTE — Progress Notes (Signed)
 Pain Scale   Average Pain 8 Patient advising she has lower back pain radiating to left leg pain is constant.        +Driver, -BT, -Dye Allergies.

## 2024-07-07 ENCOUNTER — Ambulatory Visit: Admitting: Surgical

## 2024-07-07 ENCOUNTER — Encounter: Payer: Self-pay | Admitting: Surgical

## 2024-07-07 DIAGNOSIS — M1712 Unilateral primary osteoarthritis, left knee: Secondary | ICD-10-CM | POA: Diagnosis not present

## 2024-07-07 MED ORDER — LIDOCAINE HCL 1 % IJ SOLN
5.0000 mL | INTRAMUSCULAR | Status: AC | PRN
  Administered 2024-07-07: 5 mL

## 2024-07-07 MED ORDER — TRIAMCINOLONE ACETONIDE 40 MG/ML IJ SUSP
40.0000 mg | INTRAMUSCULAR | Status: AC | PRN
  Administered 2024-07-07: 40 mg via INTRA_ARTICULAR

## 2024-07-07 MED ORDER — BUPIVACAINE HCL 0.25 % IJ SOLN
4.0000 mL | INTRAMUSCULAR | Status: AC | PRN
  Administered 2024-07-07: 4 mL via INTRA_ARTICULAR
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
## 2024-07-12 NOTE — Procedures (Signed)
 Lumbar Epidural Steroid Injection - Interlaminar Approach with Fluoroscopic Guidance  Patient: Madison Welch      Date of Birth: August 19, 1976 MRN: [id number] PCP: Benjamine Aland, MD      Visit Date: 07/06/2024   Universal Protocol:     Consent Given By: the patient  Position: PRONE  Additional Comments: Vital signs were monitored before and after the procedure. Patient was prepped and draped in the usual sterile fashion. The correct patient, procedure, and site was verified.   Injection Procedure Details:   Procedure diagnoses: Lumbar radiculopathy [M54.16]   Meds Administered:  Meds ordered this encounter  Medications   methylPREDNISolone  acetate (DEPO-MEDROL ) injection 40 mg     Laterality: Left  Location/Site:  L4-5  Needle: 4.5 in., 20 ga. Tuohy  Needle Placement: Paramedian epidural  Findings:   -Comments: Excellent flow of contrast into the epidural space.  Procedure Details: Using a paramedian approach from the side mentioned above, the region overlying the inferior lamina was localized under fluoroscopic visualization and the soft tissues overlying this structure were infiltrated with 4 ml. of 1% Lidocaine  without Epinephrine . The Tuohy needle was inserted into the epidural space using a paramedian approach.   The epidural space was localized using loss of resistance along with counter oblique bi-planar fluoroscopic views.  After negative aspirate for air, blood, and CSF, a 2 ml. volume of Isovue-250 was injected into the epidural space and the flow of contrast was observed. Radiographs were obtained for documentation purposes.    The injectate was administered into the level noted above.   Additional Comments:  The patient tolerated the procedure well Dressing: 2 x 2 sterile gauze and Band-Aid    Post-procedure details: Patient was observed during the procedure. Post-procedure instructions were reviewed.  Patient left the clinic in stable condition.

## 2024-07-12 NOTE — Progress Notes (Signed)
 Madison Welch - 48 y.o. female MRN [id number]  Date of birth: 01-20-1976  Office Visit Note: Visit Date: 07/06/2024 PCP: Benjamine Aland, MD Referred by: Benjamine Aland, MD  Subjective: Chief Complaint  Patient presents with   Lower Back - Pain   HPI:  Madison Welch is a 48 y.o. female who comes in today at the request of Dr. JUDITHANN Glendia Hutchinson for planned Left L4-5 Lumbar Interlaminar epidural steroid injection with fluoroscopic guidance.  The patient has failed conservative care including home exercise, medications, time and activity modification.  This injection will be diagnostic and hopefully therapeutic.  Please see requesting physician notes for further details and justification.   ROS Otherwise per HPI.  Assessment & Plan: Visit Diagnoses:    ICD-10-CM   1. Lumbar radiculopathy  M54.16 XR C-ARM NO REPORT    Epidural Steroid injection    methylPREDNISolone  acetate (DEPO-MEDROL ) injection 40 mg      Plan: No additional findings.   Meds & Orders:  Meds ordered this encounter  Medications   methylPREDNISolone  acetate (DEPO-MEDROL ) injection 40 mg    Orders Placed This Encounter  Procedures   XR C-ARM NO REPORT   Epidural Steroid injection    Follow-up: Return for visit to requesting provider as needed.   Procedures: No procedures performed  Lumbar Epidural Steroid Injection - Interlaminar Approach with Fluoroscopic Guidance  Patient: Madison Welch      Date of Birth: 07/17/1976 MRN: [id number] PCP: Benjamine Aland, MD      Visit Date: 07/06/2024   Universal Protocol:     Consent Given By: the patient  Position: PRONE  Additional Comments: Vital signs were monitored before and after the procedure. Patient was prepped and draped in the usual sterile fashion. The correct patient, procedure, and site was verified.   Injection Procedure Details:   Procedure diagnoses: Lumbar radiculopathy [M54.16]   Meds Administered:  Meds ordered this encounter  Medications    methylPREDNISolone  acetate (DEPO-MEDROL ) injection 40 mg     Laterality: Left  Location/Site:  L4-5  Needle: 4.5 in., 20 ga. Tuohy  Needle Placement: Paramedian epidural  Findings:   -Comments: Excellent flow of contrast into the epidural space.  Procedure Details: Using a paramedian approach from the side mentioned above, the region overlying the inferior lamina was localized under fluoroscopic visualization and the soft tissues overlying this structure were infiltrated with 4 ml. of 1% Lidocaine  without Epinephrine . The Tuohy needle was inserted into the epidural space using a paramedian approach.   The epidural space was localized using loss of resistance along with counter oblique bi-planar fluoroscopic views.  After negative aspirate for air, blood, and CSF, a 2 ml. volume of Isovue-250 was injected into the epidural space and the flow of contrast was observed. Radiographs were obtained for documentation purposes.    The injectate was administered into the level noted above.   Additional Comments:  The patient tolerated the procedure well Dressing: 2 x 2 sterile gauze and Band-Aid    Post-procedure details: Patient was observed during the procedure. Post-procedure instructions were reviewed.  Patient left the clinic in stable condition.   Clinical History: MRI LUMBAR SPINE WITHOUT CONTRAST   TECHNIQUE: Multiplanar, multisequence MR imaging of the lumbar spine was performed. No intravenous contrast was administered.   COMPARISON:  Lumbar radiographs 12/20/2023.   FINDINGS: Segmentation: Probable transitional lumbosacral anatomy with a full size S1-S2 disc space (some anatomic detail on prior radiographs limited due to large body habitus. But this appears to result in  full size ribs at T12 on both exams. Correlation with radiographs is recommended prior to any operative intervention.   Alignment: Normal lumbar lordosis. No significant scoliosis  or spondylolisthesis.   Vertebrae: Maintained vertebral body height. No marrow edema or evidence of acute osseous abnormality. Visualized bone marrow signal is within normal limits. Intact visible sacrum and SI joints.   Conus medullaris and cauda equina: Conus extends to the L1-L2 level. No lower spinal cord or conus signal abnormality. Generally normal cauda equina nerve roots.   Paraspinal and other soft tissues: Large body habitus, otherwise negative.   Disc levels:   T12-L1:  Negative.   L1-L2:  Mild facet hypertrophy.  Otherwise negative.   L2-L3: Mild facet and ligament flavum hypertrophy, otherwise negative.   L3-L4: Borderline to mild facet hypertrophy, mostly on the left. Otherwise negative.   L4-L5: Mild circumferential disc bulge. Mild ligament flavum, mild to moderate facet hypertrophy. Mild epidural lipomatosis. Borderline spinal stenosis (series 105, image 23 and series 102, image 11). Mild to moderate L4 neural foraminal stenosis, perhaps greater on the right.   L5-S1: Epidural lipomatosis primarily effaces CSF from the thecal sac at this level. But there is up to moderate bilateral facet hypertrophy with degenerative facet joint fluid on the right. No significant disc bulging.   S1-S2: Mostly lumbarized S1 level and full size disc. Epidural lipomatosis and otherwise negative.   IMPRESSION: 1. 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 Correlation with radiographs is recommended prior to any operative intervention.   2. Dominant lumbar degenerative finding is facet arthropathy, maximal at L4-L5 and L5-S1. However, superimposed disc bulging at the former with borderline spinal stenosis there and up to moderate bilateral neural foraminal stenosis. Query L4 radiculitis.     Electronically Signed   By: VEAR Hurst M.D.   On: 01/23/2024 08:39     Objective:  VS:  HT:    WT:   BMI:     BP:(!) 154/92   HR:68bpm  TEMP: ( )  RESP:  Physical Exam Vitals and nursing note reviewed.  Constitutional:      General: She is not in acute distress.    Appearance: Normal appearance. She is obese. She is not ill-appearing.  HENT:     Head: Normocephalic and atraumatic.     Right Ear: External ear normal.     Left Ear: External ear normal.  Eyes:     Extraocular Movements: Extraocular movements intact.  Cardiovascular:     Rate and Rhythm: Normal rate.     Pulses: Normal pulses.  Pulmonary:     Effort: Pulmonary effort is normal. No respiratory distress.  Abdominal:     General: There is no distension.     Palpations: Abdomen is soft.  Musculoskeletal:        General: Tenderness present.     Cervical back: Neck supple.     Right lower leg: No edema.     Left lower leg: No edema.     Comments: Patient has good distal strength with no pain over the greater trochanters.  No clonus or focal weakness.  Skin:    Findings: No erythema, lesion or rash.  Neurological:     General: No focal deficit present.     Mental Status: She is alert and oriented to person, place, and time.     Sensory: No sensory deficit.     Motor: No weakness or abnormal muscle tone.     Coordination:  Coordination normal.  Psychiatric:        Mood and Affect: Mood normal.        Behavior: Behavior normal.      Imaging: No results found.

## 2024-07-21 ENCOUNTER — Telehealth: Payer: Self-pay

## 2024-07-21 NOTE — Telephone Encounter (Signed)
 Talked with patient concerning her insurance not covering for gel injection.  Did offer TriVisc, but patient declined.  Would like to know what the next option would be for her left knee?  Please advise.  Thank you.

## 2024-07-22 NOTE — Telephone Encounter (Signed)
 Tka once bmi less than 40 vs q 4 month cortisone injection

## 2024-07-24 NOTE — Telephone Encounter (Signed)
Lvm for pt to cb to advise 

## 2024-08-18 ENCOUNTER — Ambulatory Visit: Admitting: Surgical

## 2024-08-18 DIAGNOSIS — M1712 Unilateral primary osteoarthritis, left knee: Secondary | ICD-10-CM

## 2024-08-18 MED ORDER — PREGABALIN 50 MG PO CAPS: 50.0000 mg | ORAL_CAPSULE | Freq: Every day | ORAL | 0 refills | Status: DC

## 2024-08-20 NOTE — Progress Notes (Signed)
 Office Visit Note   Patient: Madison Welch           Date of Birth: 14-Jun-1976           MRN: [id number] Visit Date: 08/18/2024 Requested by: Benjamine Aland, MD [address] [place],  [place] [address] PCP: Benjamine Aland, MD  Subjective: Chief Complaint  Patient presents with   Left Leg - Pain, Follow-up    HPI: Madison Welch is a 48 y.o. female who presents to the office reporting left knee and left leg pain.  Patient has history of left knee arthritis and had knee injection about 6 weeks ago that gave her good relief for about 1 to 2 weeks before wearing off.  She is now back in the same boat with diffuse knee pain and buckling of the left knee.  She takes ibuprofen  and Goody powders without much relief.  More bothersome for her however is radicular left leg pain.  She has pain that begins in her low back and radiates down into the left buttock and then travels down the posterior aspect of her leg extending down to the left foot in a shooting fashion.  She had prior lumbar spine ESI with Dr. Eldonna about 6 weeks ago that gave her 50% relief.  It has since worn off and lasted about 2 to 3 weeks.  During those 2 to 3 weeks, her symptoms were a lot more tolerable and her function was much improved.  She currently rates pain 7/10.  She describes a burning sensation in the same distribution as her pain.  She would like to repeat this injection..                ROS: All systems reviewed are negative as they relate to the chief complaint within the history of present illness.  Patient denies fevers or chills.  Assessment & Plan: Visit Diagnoses:  1. Arthritis of left knee     Plan: Patient is a 48 year old female who presents for evaluation of left knee pain and left leg radicular pain.  She has history of lumbar spine MRI demonstrating mild to moderate neuroforaminal stenosis at L4-L5 bilaterally.  This has responded well to left-sided L4-L5 injection by Dr. Eldonna with 50% relief of her  symptoms.  This bothers her so much currently that she has to avoid putting any pressure on her left buttock as much as she can.  She would like to repeat this injection at the next possible interval.  Plan to approve her for this.  We will also try Lyrica to see if this will be helpful for her neuropathic pain without giving her as much as the sedative side effects that she got from gabapentin .  She will be careful when she first started taking this medication and look out for any increase side effects and avoid any driving or operating machinery until she understands what effect this medication has on her.  Regarding the left knee, she is in a difficult spot with her history of partial meniscal root tear requiring debridement and the knee arthritis and higher BMI that she is dealing with.  We discussed all options available to patient such as cortisone versus gel versus PRP injections versus physical therapy modalities versus weight loss versus surgical intervention.  Gel injections not covered by her insurance unfortunately.  At this point only surgical intervention that would give predictable benefit to her would be knee replacement but her BMI precludes this currently.  As a result,  plan to try physical therapy with pool therapy at the downtown [facility] to see if this will help with her function and lessen her pain.  We will see her back in 6 weeks for clinical recheck and repeat cortisone injection at that time.  Follow-Up Instructions: No follow-ups on file.   Orders:  No orders of the defined types were placed in this encounter.  Meds ordered this encounter  Medications   pregabalin (LYRICA) 50 MG capsule    Sig: Take 1 capsule (50 mg total) by mouth daily.    Dispense:  15 capsule    Refill:  0      Procedures: No procedures performed   Clinical Data: No additional findings.  Objective: Vital Signs: LMP 09/16/2012   Physical Exam:  Constitutional: Patient appears  well-developed HEENT:  Head: Normocephalic Eyes:EOM are normal Neck: Normal range of motion Cardiovascular: Normal rate Pulmonary/chest: Effort normal Neurologic: Patient is alert Skin: Skin is warm Psychiatric: Patient has normal mood and affect  Ortho Exam: Ortho exam demonstrates left knee with small effusion.  She has tenderness over the medial and lateral joint lines with patellofemoral crepitus present.  She has intact hip flexion, quadricep, hamstring, dorsiflexion, plantarflexion, EHL strength rated 5/5.  Positive straight leg raise on left, negative on right.  No clonus noted bilaterally.  Specialty Comments:  MRI LUMBAR SPINE WITHOUT CONTRAST   TECHNIQUE: Multiplanar, multisequence MR imaging of the lumbar spine was performed. No intravenous contrast was administered.   COMPARISON:  Lumbar radiographs 12/20/2023.   FINDINGS: Segmentation: Probable transitional lumbosacral anatomy with a full size S1-S2 disc space (some anatomic detail on prior radiographs limited due to large body habitus. But this appears to result in full size ribs at T12 on both exams. Correlation with radiographs is recommended prior to any operative intervention.   Alignment: Normal lumbar lordosis. No significant scoliosis or spondylolisthesis.   Vertebrae: Maintained vertebral body height. No marrow edema or evidence of acute osseous abnormality. Visualized bone marrow signal is within normal limits. Intact visible sacrum and SI joints.   Conus medullaris and cauda equina: Conus extends to the L1-L2 level. No lower spinal cord or conus signal abnormality. Generally normal cauda equina nerve roots.   Paraspinal and other soft tissues: Large body habitus, otherwise negative.   Disc levels:   T12-L1:  Negative.   L1-L2:  Mild facet hypertrophy.  Otherwise negative.   L2-L3: Mild facet and ligament flavum hypertrophy, otherwise negative.   L3-L4: Borderline to mild facet hypertrophy,  mostly on the left. Otherwise negative.   L4-L5: Mild circumferential disc bulge. Mild ligament flavum, mild to moderate facet hypertrophy. Mild epidural lipomatosis. Borderline spinal stenosis (series 105, image 23 and series 102, image 11). Mild to moderate L4 neural foraminal stenosis, perhaps greater on the right.   L5-S1: Epidural lipomatosis primarily effaces CSF from the thecal sac at this level. But there is up to moderate bilateral facet hypertrophy with degenerative facet joint fluid on the right. No significant disc bulging.   S1-S2: Mostly lumbarized S1 level and full size disc. Epidural lipomatosis and otherwise negative.   IMPRESSION: 1. 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 Correlation with radiographs is recommended prior to any operative intervention.   2. Dominant lumbar degenerative finding is facet arthropathy, maximal at L4-L5 and L5-S1. However, superimposed disc bulging at the former with borderline spinal stenosis there and up to moderate bilateral neural foraminal stenosis. Query L4 radiculitis.  Electronically Signed   By: VEAR Hurst M.D.   On: 01/23/2024 08:39  Imaging: No results found.   PMFS History: Patient Active Problem List   Diagnosis Date Noted   Acute lateral meniscus tear of left knee 10/09/2023   Acute medial meniscus tear of left knee 10/09/2023   Loose body in knee, left knee 10/09/2023   Easy bruising 07/14/2019   Morbid obesity with BMI of 50.0-59.9, adult (HCC) 07/14/2019   Menorrhagia 09/22/2012   Dysmenorrhea 09/22/2012   Past Medical History:  Diagnosis Date   Asthma    Headache(784.0)    Legally blind    right eye   Obesity     Family History  Problem Relation Age of Onset   Lung cancer Father        metastatic to brain    Past Surgical History:  Procedure Laterality Date   ABDOMINAL HYSTERECTOMY     partial   CESAREAN SECTION     CYSTOSCOPY   09/21/2012   Procedure: CYSTOSCOPY;  Surgeon: Rexene PARAS. Rosalva, MD;  Location: [facility];  Service: Gynecology;  Laterality: N/A;  with cystotomy repair   EYE SURGERY     KNEE ARTHROSCOPY WITH MEDIAL MENISECTOMY Left 09/30/2023   Procedure: LEFT KNEE ARTHROSCOPY WITH MENISCAL DEBRIDEMENT;  Surgeon: Addie Cordella Hamilton, MD;  Location: [facility];  Service: Orthopedics;  Laterality: Left;   LEG SURGERY     ROBOTIC ASSISTED LAPAROSCOPIC LYSIS OF ADHESION  09/21/2012   Procedure: ROBOTIC ASSISTED LAPAROSCOPIC LYSIS OF ADHESION;  Surgeon: Rexene PARAS. Rosalva, MD;  Location: [facility];  Service: Gynecology;  Laterality: N/A;   ROBOTIC ASSISTED TOTAL HYSTERECTOMY  09/21/2012   Procedure: ROBOTIC ASSISTED TOTAL HYSTERECTOMY;  Surgeon: Rexene PARAS. Rosalva, MD;  Location: [facility];  Service: Gynecology;  Laterality: N/A;   TUBAL LIGATION     Social History   Occupational History   Not on file  Tobacco Use   Smoking status: Every Day    Types: Cigarettes   Smokeless tobacco: Never  Vaping Use   Vaping status: Never Used  Substance and Sexual Activity   Alcohol use: Yes   Drug use: Yes    Frequency: 2.0 times per week    Types: Marijuana   Sexual activity: Not on file

## 2024-08-21 ENCOUNTER — Encounter: Payer: Self-pay | Admitting: Radiology

## 2024-08-28 DIAGNOSIS — M5416 Radiculopathy, lumbar region: Secondary | ICD-10-CM

## 2024-09-03 ENCOUNTER — Other Ambulatory Visit: Payer: Self-pay | Admitting: Surgical

## 2024-09-03 MED ORDER — TRAMADOL HCL 50 MG PO TABS: 50.0000 mg | ORAL_TABLET | Freq: Two times a day (BID) | ORAL | 0 refills | Status: DC | PRN

## 2024-09-18 NOTE — Telephone Encounter (Signed)
 I would say a we can refer her back to Dr. Eldonna and see if there is any other injection he can think that may be helpful for her.  Looks like she had left L4-L5 injection and I do not really see anything else on her MRI scan that would be amenable to injection but maybe he has some ideas about something that may be more helpful

## 2024-09-18 NOTE — Telephone Encounter (Signed)
 Hi Madison Welch, Margi may elect to come in for a knee injection later this week.  If she does, she will need to be weighed since the last weight that she had was done in January.  She will also need new x-rays of her knee.  Thank you

## 2024-09-20 ENCOUNTER — Telehealth: Payer: Self-pay

## 2024-09-20 NOTE — Telephone Encounter (Signed)
 SABRA

## 2024-09-21 ENCOUNTER — Other Ambulatory Visit: Payer: Self-pay

## 2024-09-21 ENCOUNTER — Ambulatory Visit: Admitting: Surgical

## 2024-09-21 VITALS — Wt 320.4 lb

## 2024-09-21 DIAGNOSIS — M1712 Unilateral primary osteoarthritis, left knee: Secondary | ICD-10-CM

## 2024-09-21 MED ORDER — PREGABALIN 75 MG PO CAPS: 75.0000 mg | ORAL_CAPSULE | Freq: Every day | ORAL | 1 refills | Status: AC

## 2024-09-24 ENCOUNTER — Encounter: Payer: Self-pay | Admitting: Surgical

## 2024-09-24 MED ORDER — BUPIVACAINE HCL 0.25 % IJ SOLN
4.0000 mL | INTRAMUSCULAR | Status: AC | PRN
  Administered 2024-09-21: 4 mL via INTRA_ARTICULAR

## 2024-09-24 MED ORDER — TRIAMCINOLONE ACETONIDE 40 MG/ML IJ SUSP
40.0000 mg | INTRAMUSCULAR | Status: AC | PRN
  Administered 2024-09-21: 40 mg via INTRA_ARTICULAR

## 2024-09-24 MED ORDER — LIDOCAINE HCL 1 % IJ SOLN
5.0000 mL | INTRAMUSCULAR | Status: AC | PRN
  Administered 2024-09-21: 5 mL

## 2024-09-24 NOTE — Progress Notes (Signed)
   Procedure Note  Patient: Ronette Hank             Date of Birth: 1976-10-04           MRN: [id number]             Visit Date: 09/21/2024  Procedures: Visit Diagnoses:  1. Arthritis of left knee     Large Joint Inj: L knee on 09/21/2024 12:31 PM Indications: diagnostic evaluation, joint swelling and pain Details: 18 G 1.5 in needle, superolateral approach  Arthrogram: No  Medications: 5 mL lidocaine  1 %; 4 mL bupivacaine  0.25 %; 40 mg triamcinolone  acetonide 40 MG/ML Outcome: tolerated well, no immediate complications Procedure, treatment alternatives, risks and benefits explained, specific risks discussed. Consent was given by the patient. Immediately prior to procedure a time out was called to verify the correct patient, procedure, equipment, support staff and site/side marked as required. Patient was prepped and draped in the usual sterile fashion.

## 2024-09-28 ENCOUNTER — Ambulatory Visit: Admitting: Physical Medicine and Rehabilitation

## 2024-10-09 ENCOUNTER — Telehealth: Payer: Self-pay | Admitting: Physical Medicine and Rehabilitation

## 2024-10-09 NOTE — Telephone Encounter (Signed)
 Pt called requesting a call from PA Megan to discuss if MRI review is an old MRI. Please call pt at [phone].

## 2024-10-24 ENCOUNTER — Encounter: Payer: Self-pay | Admitting: Physical Medicine and Rehabilitation

## 2024-10-24 ENCOUNTER — Ambulatory Visit: Admitting: Physical Medicine and Rehabilitation

## 2024-10-24 DIAGNOSIS — G8929 Other chronic pain: Secondary | ICD-10-CM

## 2024-10-24 DIAGNOSIS — M5416 Radiculopathy, lumbar region: Secondary | ICD-10-CM

## 2024-10-24 DIAGNOSIS — M5442 Lumbago with sciatica, left side: Secondary | ICD-10-CM | POA: Diagnosis not present

## 2024-10-24 NOTE — Progress Notes (Signed)
 "  Madison Welch - 49 y.o. female MRN [id number]  Date of birth: 1975-12-28  Office Visit Note: Visit Date: 10/24/2024 PCP: Benjamine Aland, MD Referred by: Benjamine Aland, MD  Subjective: Chief Complaint  Patient presents with   Lower Back - Pain   HPI: Madison Welch is a 49 y.o. female who comes in today for evaluation of chronic, worsening and severe left sided lower back pain radiating to buttock and down posterior leg to knee. Pain ongoing for about 1 year, worsens when laying on left side, prolonged sitting and walking. She describes pain as constant dull and radiating pain, currently rates as 5 out of 10. Some relief of pain with home exercise regimen, rest and use of medications. No history of dedicated physical therapy for her lower back. She is currently taking Lyrica  75 mg once daily. Lumbar MRI imaging from April of 2025 shows mostly lumbarized S1 level, facet arthropathy at L4-L5 and L5-S1. Mild to moderate L4 neural foraminal stenosis, perhaps greater on the right. No high grade spinal canal stenosis noted. Patient underwent left L3-L4 interlaminar epidural steroid injection in our office on 07/06/2024. She reports less than 50% relief of pain for only 2-3 days. Prior injection was left L4-L5 interlaminar in April of 2025. Reports the interlaminar injection at L4-L5 was the most effective in reducing her pain. Patient denies focal weakness, numbness and tingling. No recent trauma or falls.       Review of Systems  Musculoskeletal:  Positive for back pain.  Neurological:  Negative for tingling, sensory change, focal weakness and weakness.  All other systems reviewed and are negative.  Otherwise per HPI.  Assessment & Plan: Visit Diagnoses:    ICD-10-CM   1. Chronic left-sided low back pain with left-sided sciatica  M54.42 Ambulatory referral to Physical Medicine Rehab   G89.29     2. Lumbar radiculopathy  M54.16 Ambulatory referral to Physical Medicine Rehab    3. Morbid  (severe) obesity due to excess calories (HCC)  E66.01 Ambulatory referral to Physical Medicine Rehab       Plan: Findings:  Chronic, worsening and severe left sided lower back pain radiating to buttock and down posterior leg to knee. Patient continues to have severe pain despite good conservative therapies such as home exercise regimen, rest and use of medications. Patients clinical presentation and exam are consistent with lumbar radiculopathy, more of an S1 nerve pattern.  Her pain pattern does not directly correlate with prior lumbar MRI imaging, also does not fit with true facet mediated pain.  We discussed treatment plan in detail today.  Next step is to perform diagnostic and hopefully therapeutic left L5-S1 interlaminar epidural steroid injection under fluoroscopic guidance.  If good relief of pain with injection we can repeat this procedure infrequently as needed.  Should her pain persist we would need to look at other treatment options such as formal physical therapy.  Patient has no questions at this time.  We will see her back for injection procedure.  No red flag symptoms noted upon exam today.    Meds & Orders: No orders of the defined types were placed in this encounter.   Orders Placed This Encounter  Procedures   Ambulatory referral to Physical Medicine Rehab    Follow-up: Return for Left L5-S1 interlaminar epidural steroid injection.   Procedures: No procedures performed      Clinical History: MRI LUMBAR SPINE WITHOUT CONTRAST   TECHNIQUE: Multiplanar, multisequence MR imaging of the lumbar spine was performed. No  intravenous contrast was administered.   COMPARISON:  Lumbar radiographs 12/20/2023.   FINDINGS: Segmentation: Probable transitional lumbosacral anatomy with a full size S1-S2 disc space (some anatomic detail on prior radiographs limited due to large body habitus. But this appears to result in full size ribs at T12 on both exams. Correlation with radiographs  is recommended prior to any operative intervention.   Alignment: Normal lumbar lordosis. No significant scoliosis or spondylolisthesis.   Vertebrae: Maintained vertebral body height. No marrow edema or evidence of acute osseous abnormality. Visualized bone marrow signal is within normal limits. Intact visible sacrum and SI joints.   Conus medullaris and cauda equina: Conus extends to the L1-L2 level. No lower spinal cord or conus signal abnormality. Generally normal cauda equina nerve roots.   Paraspinal and other soft tissues: Large body habitus, otherwise negative.   Disc levels:   T12-L1:  Negative.   L1-L2:  Mild facet hypertrophy.  Otherwise negative.   L2-L3: Mild facet and ligament flavum hypertrophy, otherwise negative.   L3-L4: Borderline to mild facet hypertrophy, mostly on the left. Otherwise negative.   L4-L5: Mild circumferential disc bulge. Mild ligament flavum, mild to moderate facet hypertrophy. Mild epidural lipomatosis. Borderline spinal stenosis (series 105, image 23 and series 102, image 11). Mild to moderate L4 neural foraminal stenosis, perhaps greater on the right.   L5-S1: Epidural lipomatosis primarily effaces CSF from the thecal sac at this level. But there is up to moderate bilateral facet hypertrophy with degenerative facet joint fluid on the right. No significant disc bulging.   S1-S2: Mostly lumbarized S1 level and full size disc. Epidural lipomatosis and otherwise negative.   IMPRESSION: 1. 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 Correlation with radiographs is recommended prior to any operative intervention.   2. Dominant lumbar degenerative finding is facet arthropathy, maximal at L4-L5 and L5-S1. However, superimposed disc bulging at the former with borderline spinal stenosis there and up to moderate bilateral neural foraminal stenosis. Query L4 radiculitis.     Electronically  Signed   By: VEAR Hurst M.D.   On: 01/23/2024 08:39   She reports that she has been smoking cigarettes. She has never used smokeless tobacco. No results for input(s): HGBA1C, LABURIC in the last 8760 hours.  Objective:  VS:  HT:    WT:   BMI:     BP:   HR: bpm  TEMP: ( )  RESP:  Physical Exam Vitals and nursing note reviewed.  HENT:     Head: Normocephalic and atraumatic.     Right Ear: External ear normal.     Left Ear: External ear normal.     Nose: Nose normal.     Mouth/Throat:     Mouth: Mucous membranes are moist.  Eyes:     Extraocular Movements: Extraocular movements intact.  Cardiovascular:     Rate and Rhythm: Normal rate.     Pulses: Normal pulses.  Pulmonary:     Effort: Pulmonary effort is normal.  Abdominal:     General: Abdomen is flat. There is no distension.  Musculoskeletal:        General: Tenderness present.     Cervical back: Normal range of motion.     Comments: Patient rises from seated position to standing without difficulty. Good lumbar range of motion. No pain noted with facet loading. 5/5 strength noted with bilateral hip flexion, knee flexion/extension, ankle dorsiflexion/plantarflexion and EHL. No clonus noted bilaterally. No pain upon palpation  of greater trochanters. No pain with internal/external rotation of bilateral hips. Sensation intact bilaterally. Dysesthesias noted to left S1 dermatome. Negative slump test bilaterally. Ambulates without aid, gait steady.     Skin:    General: Skin is warm and dry.     Capillary Refill: Capillary refill takes less than 2 seconds.  Neurological:     General: No focal deficit present.     Mental Status: She is alert and oriented to person, place, and time.  Psychiatric:        Mood and Affect: Mood normal.        Behavior: Behavior normal.     Ortho Exam  Imaging: No results found.  Past Medical/Family/Surgical/Social History: Medications & Allergies reviewed per EMR, new medications  updated. Patient Active Problem List   Diagnosis Date Noted   Acute lateral meniscus tear of left knee 10/09/2023   Acute medial meniscus tear of left knee 10/09/2023   Loose body in knee, left knee 10/09/2023   Easy bruising 07/14/2019   Morbid obesity with BMI of 50.0-59.9, adult (HCC) 07/14/2019   Menorrhagia 09/22/2012   Dysmenorrhea 09/22/2012   Past Medical History:  Diagnosis Date   Asthma    Headache(784.0)    Legally blind    right eye   Obesity    Family History  Problem Relation Age of Onset   Lung cancer Father        metastatic to brain   Past Surgical History:  Procedure Laterality Date   ABDOMINAL HYSTERECTOMY     partial   CESAREAN SECTION     CYSTOSCOPY  09/21/2012   Procedure: CYSTOSCOPY;  Surgeon: Rexene PARAS. Rosalva, MD;  Location: [facility];  Service: Gynecology;  Laterality: N/A;  with cystotomy repair   EYE SURGERY     KNEE ARTHROSCOPY WITH MEDIAL MENISECTOMY Left 09/30/2023   Procedure: LEFT KNEE ARTHROSCOPY WITH MENISCAL DEBRIDEMENT;  Surgeon: Addie Cordella Hamilton, MD;  Location: [facility] OR;  Service: Orthopedics;  Laterality: Left;   LEG SURGERY     ROBOTIC ASSISTED LAPAROSCOPIC LYSIS OF ADHESION  09/21/2012   Procedure: ROBOTIC ASSISTED LAPAROSCOPIC LYSIS OF ADHESION;  Surgeon: Rexene PARAS. Rosalva, MD;  Location: [facility];  Service: Gynecology;  Laterality: N/A;   ROBOTIC ASSISTED TOTAL HYSTERECTOMY  09/21/2012   Procedure: ROBOTIC ASSISTED TOTAL HYSTERECTOMY;  Surgeon: Rexene PARAS. Rosalva, MD;  Location: [facility];  Service: Gynecology;  Laterality: N/A;   TUBAL LIGATION     Social History   Occupational History   Not on file  Tobacco Use   Smoking status: Every Day    Types: Cigarettes   Smokeless tobacco: Never  Vaping Use   Vaping status: Never Used  Substance and Sexual Activity   Alcohol use: Yes   Drug use: Yes    Frequency: 2.0 times per week    Types: Marijuana   Sexual activity: Not on file    "

## 2024-10-24 NOTE — Progress Notes (Signed)
 Pain Scale   Average Pain 6 Patient advising she has chronic lower back pain radiating to left hip and leg pain is constant. Last injection did not help       +Driver, -BT, -Dye Allergies.

## 2024-10-30 NOTE — Progress Notes (Unsigned)
 "    Referring-Veita Benjamine, MD Reason for referral-abnormal ECG  HPI: 49 year old female for evaluation of abnormal ECG at request of Kennieth Benjamine, MD. Patient recently seen and ECG felt to be abnormal.  Copy not available.  Cardiology now asked to evaluate.  She has some dyspnea on exertion but no orthopnea or PND.  She has mild pedal edema when she travels long distances. Occasional brief flutters but no sustained palpitations.  No syncope.  She denies exertional chest pain.  Current Outpatient Medications  Medication Sig Dispense Refill   albuterol  (PROVENTIL  HFA;VENTOLIN  HFA) 108 (90 BASE) MCG/ACT inhaler Inhale 2 puffs into the lungs every 6 (six) hours as needed for wheezing or shortness of breath.      Aspirin-Acetaminophen -Caffeine (GOODY HEADACHE PO) Take 2 Packages by mouth daily.     diclofenac  (VOLTAREN ) 75 MG EC tablet TAKE 1 TABLET BY MOUTH TWICE DAILY FOR 2 WEEKS 28 tablet 0   pregabalin  (LYRICA ) 75 MG capsule Take 1 capsule (75 mg total) by mouth daily. 30 capsule 1   acetaminophen  (TYLENOL ) 500 MG tablet Take one tablet (500 mg) every 6 hours as neededfor mild pain and two tablets (1000 mg) every 8 hours as needed for moderate pain. Maximum 6 tablets daily. Do not take aleve, advil , ibuprofen , aspirin, goody powders, or similar medications 30 tablet 0   hydrOXYzine (VISTARIL) 25 MG capsule Take 25 mg by mouth daily as needed for anxiety. (Patient not taking: Reported on 10/31/2024)     lidocaine  (HM LIDOCAINE  PATCH) 4 % Place 1 patch onto the skin daily. 20 patch 0   methocarbamol  (ROBAXIN ) 500 MG tablet Take 1 tablet (500 mg total) by mouth every 8 (eight) hours as needed for muscle spasms. 30 tablet 0   traMADol  (ULTRAM ) 50 MG tablet Take 1 tablet (50 mg total) by mouth every 12 (twelve) hours as needed. 30 tablet 0   No current facility-administered medications for this visit.    Allergies[1]   Past Medical History:  Diagnosis Date   Asthma    Headache(784.0)     Legally blind    right eye   Obesity     Past Surgical History:  Procedure Laterality Date   ABDOMINAL HYSTERECTOMY     partial   CESAREAN SECTION     CYSTOSCOPY  09/21/2012   Procedure: CYSTOSCOPY;  Surgeon: Rexene PARAS. Rosalva, MD;  Location: [facility];  Service: Gynecology;  Laterality: N/A;  with cystotomy repair   EYE SURGERY     KNEE ARTHROSCOPY WITH MEDIAL MENISECTOMY Left 09/30/2023   Procedure: LEFT KNEE ARTHROSCOPY WITH MENISCAL DEBRIDEMENT;  Surgeon: Addie Cordella Hamilton, MD;  Location: [facility] OR;  Service: Orthopedics;  Laterality: Left;   LEG SURGERY     ROBOTIC ASSISTED LAPAROSCOPIC LYSIS OF ADHESION  09/21/2012   Procedure: ROBOTIC ASSISTED LAPAROSCOPIC LYSIS OF ADHESION;  Surgeon: Rexene PARAS. Rosalva, MD;  Location: [facility];  Service: Gynecology;  Laterality: N/A;   ROBOTIC ASSISTED TOTAL HYSTERECTOMY  09/21/2012   Procedure: ROBOTIC ASSISTED TOTAL HYSTERECTOMY;  Surgeon: Rexene PARAS. Rosalva, MD;  Location: [facility];  Service: Gynecology;  Laterality: N/A;   TUBAL LIGATION      Social History   Socioeconomic History   Marital status: Divorced    Spouse name: Not on file   Number of children: 2   Years of education: Not on file   Highest education level: Not on file  Occupational History   Not on file  Tobacco Use   Smoking status: Every Day  Types: Cigarettes   Smokeless tobacco: Never   Tobacco comments:    10/31/2024 Patient smokes about a half a pack daily  Vaping Use   Vaping status: Never Used  Substance and Sexual Activity   Alcohol use: Yes   Drug use: Yes    Frequency: 2.0 times per week    Types: Marijuana   Sexual activity: Not on file  Other Topics Concern   Not on file  Social History Narrative   Not on file   Social Drivers of Health   Tobacco Use: High Risk (10/31/2024)   Patient History    Smoking Tobacco Use: Every Day    Smokeless Tobacco Use: Never    Passive Exposure: Not on file  Financial Resource Strain: Not on file  Food Insecurity: Not on file   Transportation Needs: Not on file  Physical Activity: Not on file  Stress: Not on file  Social Connections: Unknown (03/03/2022)   Received from [facility]   Social Network    Social Network: Not on file  Intimate Partner Violence: Unknown (01/23/2022)   Received from [facility]   HITS    Physically Hurt: Not on file    Insult or Talk Down To: Not on file    Threaten Physical Harm: Not on file    Scream or Curse: Not on file  Depression (PHQ2-9): Not on file  Alcohol Screen: Not on file  Housing: Not on file  Utilities: Not on file  Health Literacy: Not on file    Family History  Problem Relation Age of Onset   Heart failure Mother    Lung cancer Father        metastatic to brain    ROS: no fevers or chills, productive cough, hemoptysis, dysphasia, odynophagia, melena, hematochezia, dysuria, hematuria, rash, seizure activity, orthopnea, PND, pedal edema, claudication. Remaining systems are negative.  Physical Exam:   Blood pressure (!) 134/92, pulse 75, height 5' 3 (1.6 m), weight (!) 322 lb 1.6 oz (146.1 kg), last menstrual period 09/16/2012, SpO2 96%.  General:  Well developed/well nourished in NAD Skin warm/dry Patient not depressed No peripheral clubbing Back-normal HEENT-normal/normal eyelids Neck supple/normal carotid upstroke bilaterally; no bruits; no JVD; no thyromegaly chest - CTA/ normal expansion CV - RRR/normal S1 and S2; no murmurs, rubs or gallops;  PMI nondisplaced Abdomen -NT/ND, no HSM, no mass, + bowel sounds, no bruit 2+ femoral pulses, no bruits Ext-no edema, chords, 2+ DP Neuro-grossly nonfocal  EKG Interpretation Date/Time:  Tuesday October 31 2024 09:06:22 EST Ventricular Rate:  75 PR Interval:  162 QRS Duration:  80 QT Interval:  412 QTC Calculation: 460 R Axis:   8  Text Interpretation: Normal sinus rhythm Normal ECG Confirmed by Pietro Rogue ([id number]) on 10/31/2024 9:08:13 AM    A/P  1 abnormal ECG-electrocardiogram today  is normal.  I will try and obtain copy from her primary care physician's office.  2 dyspnea on exertion-Will arrange echocardiogram to assess LV function.  There may also be a contribution from asthma and obesity.  3 elevated blood pressure reading-patient states her blood pressure at home is typically controlled.  I have asked her to follow this and medications can be added as needed.  Goal systolic blood pressure less than 130 and diastolic less than 85.  4 tobacco abuse-patient counseled on discontinuing.  5 obesity-we discussed the importance of weight loss.  Rogue Pietro, MD     [1] No Known Allergies  "

## 2024-10-31 ENCOUNTER — Encounter: Payer: Self-pay | Admitting: Cardiology

## 2024-10-31 ENCOUNTER — Ambulatory Visit: Attending: Cardiology | Admitting: Cardiology

## 2024-10-31 ENCOUNTER — Telehealth: Payer: Self-pay | Admitting: Cardiology

## 2024-10-31 VITALS — BP 134/92 | HR 75 | Ht 63.0 in | Wt 322.1 lb

## 2024-10-31 DIAGNOSIS — R0602 Shortness of breath: Secondary | ICD-10-CM | POA: Diagnosis not present

## 2024-10-31 DIAGNOSIS — R9431 Abnormal electrocardiogram [ECG] [EKG]: Secondary | ICD-10-CM | POA: Diagnosis not present

## 2024-10-31 NOTE — Patient Instructions (Addendum)
 Medication Instructions:  Continue same medications  Lab Work: None ordered  Testing/Procedures: Echo   Follow-Up: At [facility], you and your health needs are our priority.  As part of our continuing mission to provide you with exceptional heart care, our providers are all part of one team.  This team includes your primary Cardiologist (physician) and Advanced Practice Providers or APPs (Physician Assistants and Nurse Practitioners) who all work together to provide you with the care you need, when you need it.  Your next appointment:  As Needed    Provider:  Dr.Crenshaw   We recommend signing up for the patient portal called MyChart.  Sign up information is provided on this After Visit Summary.  MyChart is used to connect with patients for Virtual Visits (Telemedicine).  Patients are able to view lab/test results, encounter notes, upcoming appointments, etc.  Non-urgent messages can be sent to your provider as well.   To learn more about what you can do with MyChart, go to [email or web address].

## 2024-10-31 NOTE — Telephone Encounter (Signed)
 [facility] # [id number] requested patient's EKG to be faxed to Dr.Crenshaw.

## 2024-10-31 NOTE — Telephone Encounter (Signed)
 Kalynn, called wanting to know what records are needed and if a medical release was signed. She said they received and call from a nurse requesting records.

## 2024-11-01 ENCOUNTER — Telehealth: Payer: Self-pay

## 2024-11-01 ENCOUNTER — Telehealth: Payer: Self-pay | Admitting: *Deleted

## 2024-11-01 NOTE — Telephone Encounter (Signed)
 Received a call from [facility] they will be faxing patient's EKG.

## 2024-11-01 NOTE — Telephone Encounter (Signed)
 Spoke with pt, she is going to find the name of the provider that did her ECG and call me back with their name so we can get the ECG they referred the patient to us  for.

## 2024-11-07 ENCOUNTER — Ambulatory Visit (HOSPITAL_COMMUNITY)
Admission: RE | Admit: 2024-11-07 | Discharge: 2024-11-07 | Disposition: A | Discharge status: Home / Self Care | Source: Ambulatory Visit | Attending: Cardiology | Admitting: Cardiology

## 2024-11-07 DIAGNOSIS — R0602 Shortness of breath: Secondary | ICD-10-CM | POA: Insufficient documentation

## 2024-11-07 DIAGNOSIS — R9431 Abnormal electrocardiogram [ECG] [EKG]: Secondary | ICD-10-CM | POA: Insufficient documentation

## 2024-11-08 ENCOUNTER — Ambulatory Visit: Payer: Self-pay | Admitting: Cardiology

## 2024-11-08 LAB — ECHOCARDIOGRAM COMPLETE
Area-P 1/2: 3.85 cm2
S' Lateral: 3.5 cm

## 2024-11-09 ENCOUNTER — Other Ambulatory Visit: Payer: Self-pay | Admitting: Surgical

## 2024-11-13 ENCOUNTER — Encounter: Admitting: Physical Medicine and Rehabilitation

## 2024-12-04 ENCOUNTER — Encounter: Admitting: Physical Medicine and Rehabilitation
